# Patient Record
Sex: Male | Born: 1935
Health system: Southern US, Community
[De-identification: ages and names within clinical notes are randomized; demographics above are authoritative.]

## PROBLEM LIST (undated history)

## (undated) DIAGNOSIS — S381XXA Crushing injury of abdomen, lower back, and pelvis, initial encounter: Secondary | ICD-10-CM

## (undated) DIAGNOSIS — Z972 Presence of dental prosthetic device (complete) (partial): Secondary | ICD-10-CM

## (undated) DIAGNOSIS — D6959 Other secondary thrombocytopenia: Secondary | ICD-10-CM

## (undated) DIAGNOSIS — S8991XA Unspecified injury of right lower leg, initial encounter: Secondary | ICD-10-CM

## (undated) DIAGNOSIS — Z973 Presence of spectacles and contact lenses: Secondary | ICD-10-CM

## (undated) DIAGNOSIS — Z8739 Personal history of other diseases of the musculoskeletal system and connective tissue: Secondary | ICD-10-CM

## (undated) DIAGNOSIS — R7309 Other abnormal glucose: Secondary | ICD-10-CM

## (undated) DIAGNOSIS — I10 Essential (primary) hypertension: Secondary | ICD-10-CM

## (undated) DIAGNOSIS — C61 Malignant neoplasm of prostate: Secondary | ICD-10-CM

## (undated) DIAGNOSIS — I872 Venous insufficiency (chronic) (peripheral): Secondary | ICD-10-CM

## (undated) DIAGNOSIS — I82409 Acute embolism and thrombosis of unspecified deep veins of unspecified lower extremity: Secondary | ICD-10-CM

## (undated) DIAGNOSIS — N189 Chronic kidney disease, unspecified: Secondary | ICD-10-CM

## (undated) DIAGNOSIS — E785 Hyperlipidemia, unspecified: Secondary | ICD-10-CM

## (undated) DIAGNOSIS — M549 Dorsalgia, unspecified: Secondary | ICD-10-CM

## (undated) DIAGNOSIS — Z923 Personal history of irradiation: Secondary | ICD-10-CM

## (undated) DIAGNOSIS — N281 Cyst of kidney, acquired: Secondary | ICD-10-CM

## (undated) DIAGNOSIS — T50905A Adverse effect of unspecified drugs, medicaments and biological substances, initial encounter: Secondary | ICD-10-CM

## (undated) DIAGNOSIS — R351 Nocturia: Secondary | ICD-10-CM

## (undated) DIAGNOSIS — H9190 Unspecified hearing loss, unspecified ear: Secondary | ICD-10-CM

## (undated) HISTORY — DX: Adverse effect of unspecified drugs, medicaments and biological substances, initial encounter: T50.905A

## (undated) HISTORY — DX: Essential (primary) hypertension: I10

## (undated) HISTORY — DX: Venous insufficiency (chronic) (peripheral): I87.2

## (undated) HISTORY — DX: Other secondary thrombocytopenia: D69.59

## (undated) HISTORY — PX: COLONOSCOPY: SHX174

## (undated) HISTORY — DX: Other abnormal glucose: R73.09

## (undated) HISTORY — DX: Hyperlipidemia, unspecified: E78.5

## (undated) HISTORY — DX: Acute embolism and thrombosis of unspecified deep veins of unspecified lower extremity: I82.409

---

## 2004-12-04 ENCOUNTER — Ambulatory Visit: Payer: Self-pay | Admitting: Internal Medicine

## 2005-08-13 ENCOUNTER — Ambulatory Visit: Payer: Self-pay | Admitting: Internal Medicine

## 2005-08-20 ENCOUNTER — Ambulatory Visit: Payer: Self-pay | Admitting: Internal Medicine

## 2005-08-27 ENCOUNTER — Ambulatory Visit: Payer: Self-pay | Admitting: Internal Medicine

## 2006-09-05 ENCOUNTER — Ambulatory Visit: Payer: Self-pay | Admitting: Internal Medicine

## 2007-03-21 ENCOUNTER — Ambulatory Visit: Payer: Self-pay | Admitting: Internal Medicine

## 2007-03-21 LAB — CONVERTED CEMR LAB
Cholesterol: 137 mg/dL (ref 0–200)
HDL: 29 mg/dL — ABNORMAL LOW (ref >39.0)
Hgb A1c MFr Bld: 6.6 % — ABNORMAL HIGH (ref 4.6–6.0)
LDL Cholesterol: 69 mg/dL (ref 0–99)

## 2007-09-12 ENCOUNTER — Ambulatory Visit: Payer: Self-pay | Admitting: Internal Medicine

## 2007-09-12 ENCOUNTER — Encounter: Payer: Self-pay | Admitting: Internal Medicine

## 2007-09-12 DIAGNOSIS — E785 Hyperlipidemia, unspecified: Secondary | ICD-10-CM

## 2007-09-12 DIAGNOSIS — I10 Essential (primary) hypertension: Secondary | ICD-10-CM

## 2007-09-12 DIAGNOSIS — R319 Hematuria, unspecified: Secondary | ICD-10-CM | POA: Insufficient documentation

## 2007-09-12 LAB — CONVERTED CEMR LAB
AST: 32 units/L (ref 0–37)
Albumin: 4.1 g/dL (ref 3.5–5.2)
Bacteria, UA: NEGATIVE
Basophils Absolute: 0 10*3/uL (ref 0.0–0.1)
Basophils Relative: 0 % (ref 0.0–1.0)
Creatinine, Ser: 1.8 mg/dL — ABNORMAL HIGH (ref 0.4–1.5)
Crystals: NEGATIVE
Eosinophils Relative: 1.4 % (ref 0.0–5.0)
GFR calc Af Amer: 48 mL/min
MCV: 93.9 fL (ref 78.0–100.0)
Monocytes Absolute: 0.6 10*3/uL (ref 0.2–0.7)
Monocytes Relative: 10.3 % (ref 3.0–11.0)
Nitrite: NEGATIVE
Platelets: 193 10*3/uL (ref 150–400)
RDW: 12.3 % (ref 11.5–14.6)
Sodium: 143 meq/L (ref 135–145)
Squamous Epithelial / LPF: NEGATIVE /lpf
TSH: 1.59 microintl units/mL (ref 0.35–5.50)
Total Protein: 6.4 g/dL (ref 6.0–8.3)
Urobilinogen, UA: 1 (ref 0.0–1.0)

## 2007-09-14 ENCOUNTER — Ambulatory Visit: Payer: Self-pay

## 2007-10-20 ENCOUNTER — Encounter: Payer: Self-pay | Admitting: Internal Medicine

## 2007-10-20 ENCOUNTER — Ambulatory Visit: Payer: Self-pay | Admitting: Internal Medicine

## 2007-10-20 DIAGNOSIS — E1122 Type 2 diabetes mellitus with diabetic chronic kidney disease: Secondary | ICD-10-CM

## 2007-10-20 DIAGNOSIS — N183 Chronic kidney disease, stage 3 (moderate): Secondary | ICD-10-CM

## 2007-10-20 DIAGNOSIS — I872 Venous insufficiency (chronic) (peripheral): Secondary | ICD-10-CM

## 2007-11-06 ENCOUNTER — Telehealth: Payer: Self-pay | Admitting: Internal Medicine

## 2008-01-05 ENCOUNTER — Encounter: Payer: Self-pay | Admitting: Internal Medicine

## 2008-01-12 ENCOUNTER — Ambulatory Visit: Payer: Self-pay | Admitting: Internal Medicine

## 2008-01-13 LAB — CONVERTED CEMR LAB
BUN: 21 mg/dL (ref 6–23)
Creatinine, Ser: 1.5 mg/dL (ref 0.4–1.5)
GFR calc non Af Amer: 49 mL/min
Glucose, Bld: 141 mg/dL — ABNORMAL HIGH (ref 70–99)
Potassium: 4.3 meq/L (ref 3.5–5.1)
Sodium: 139 meq/L (ref 135–145)

## 2008-01-18 ENCOUNTER — Ambulatory Visit: Payer: Self-pay | Admitting: Internal Medicine

## 2008-01-18 DIAGNOSIS — N281 Cyst of kidney, acquired: Secondary | ICD-10-CM

## 2008-01-18 DIAGNOSIS — E119 Type 2 diabetes mellitus without complications: Secondary | ICD-10-CM | POA: Insufficient documentation

## 2008-01-19 ENCOUNTER — Encounter: Admission: RE | Admit: 2008-01-19 | Discharge: 2008-01-19 | Payer: Self-pay | Admitting: Nephrology

## 2008-03-21 ENCOUNTER — Encounter: Payer: Self-pay | Admitting: Internal Medicine

## 2008-07-16 ENCOUNTER — Ambulatory Visit: Payer: Self-pay | Admitting: Internal Medicine

## 2008-07-16 DIAGNOSIS — M545 Low back pain, unspecified: Secondary | ICD-10-CM | POA: Insufficient documentation

## 2008-09-05 ENCOUNTER — Encounter: Payer: Self-pay | Admitting: Internal Medicine

## 2008-09-12 ENCOUNTER — Ambulatory Visit: Payer: Self-pay | Admitting: Internal Medicine

## 2008-10-10 ENCOUNTER — Ambulatory Visit: Payer: Self-pay | Admitting: Internal Medicine

## 2008-10-21 LAB — CBC WITH DIFFERENTIAL/PLATELET
Basophils Absolute: 0 10*3/uL (ref 0.0–0.1)
Eosinophils Absolute: 0.1 10*3/uL (ref 0.0–0.5)
HGB: 16.2 g/dL (ref 13.0–17.1)
MCV: 94.3 fL (ref 81.6–98.0)
MONO#: 0.5 10*3/uL (ref 0.1–0.9)
NEUT#: 4 10*3/uL (ref 1.5–6.5)
RBC: 5.02 10*6/uL (ref 4.20–5.71)
RDW: 12.5 % (ref 11.2–14.6)
WBC: 6.3 10*3/uL (ref 4.0–10.0)

## 2008-10-21 LAB — COMPREHENSIVE METABOLIC PANEL
Albumin: 4.4 g/dL (ref 3.5–5.2)
BUN: 24 mg/dL — ABNORMAL HIGH (ref 6–23)
CO2: 27 mEq/L (ref 19–32)
Calcium: 9 mg/dL (ref 8.4–10.5)
Chloride: 102 mEq/L (ref 96–112)
Glucose, Bld: 106 mg/dL — ABNORMAL HIGH (ref 70–99)
Potassium: 4.4 mEq/L (ref 3.5–5.3)
Sodium: 139 mEq/L (ref 135–145)
Total Protein: 6.6 g/dL (ref 6.0–8.3)

## 2008-10-21 LAB — LACTATE DEHYDROGENASE: LDH: 150 U/L (ref 94–250)

## 2009-02-28 ENCOUNTER — Ambulatory Visit: Payer: Self-pay | Admitting: Internal Medicine

## 2009-02-28 LAB — CONVERTED CEMR LAB
BUN: 23 mg/dL (ref 6–23)
CO2: 32 meq/L (ref 19–32)
Calcium: 8.9 mg/dL (ref 8.4–10.5)
Chloride: 105 meq/L (ref 96–112)
Creatinine, Ser: 1.6 mg/dL — ABNORMAL HIGH (ref 0.4–1.5)
GFR calc non Af Amer: 45 mL/min
Potassium: 4.3 meq/L (ref 3.5–5.1)
Sodium: 145 meq/L (ref 135–145)

## 2009-03-11 ENCOUNTER — Encounter: Payer: Self-pay | Admitting: Internal Medicine

## 2009-03-12 ENCOUNTER — Ambulatory Visit: Payer: Self-pay | Admitting: Internal Medicine

## 2009-04-19 ENCOUNTER — Ambulatory Visit (HOSPITAL_COMMUNITY): Admission: RE | Admit: 2009-04-19 | Discharge: 2009-04-19 | Payer: Self-pay | Admitting: Urology

## 2009-07-28 ENCOUNTER — Encounter (INDEPENDENT_AMBULATORY_CARE_PROVIDER_SITE_OTHER): Payer: Self-pay | Admitting: *Deleted

## 2009-09-12 ENCOUNTER — Encounter: Payer: Self-pay | Admitting: Internal Medicine

## 2009-09-17 ENCOUNTER — Ambulatory Visit: Payer: Self-pay | Admitting: Internal Medicine

## 2009-10-02 ENCOUNTER — Encounter: Payer: Self-pay | Admitting: Internal Medicine

## 2009-12-31 ENCOUNTER — Encounter (INDEPENDENT_AMBULATORY_CARE_PROVIDER_SITE_OTHER): Payer: Self-pay | Admitting: *Deleted

## 2010-01-08 ENCOUNTER — Encounter (INDEPENDENT_AMBULATORY_CARE_PROVIDER_SITE_OTHER): Payer: Self-pay | Admitting: *Deleted

## 2010-01-12 ENCOUNTER — Ambulatory Visit: Payer: Self-pay | Admitting: Internal Medicine

## 2010-01-26 ENCOUNTER — Ambulatory Visit: Payer: Self-pay | Admitting: Internal Medicine

## 2010-01-26 LAB — HM COLONOSCOPY

## 2010-01-27 ENCOUNTER — Encounter: Payer: Self-pay | Admitting: Internal Medicine

## 2010-01-29 ENCOUNTER — Encounter: Payer: Self-pay | Admitting: Internal Medicine

## 2010-03-10 ENCOUNTER — Ambulatory Visit: Payer: Self-pay | Admitting: Internal Medicine

## 2010-03-10 LAB — CONVERTED CEMR LAB
Calcium: 8.9 mg/dL (ref 8.4–10.5)
Creatinine, Ser: 1.7 mg/dL — ABNORMAL HIGH (ref 0.4–1.5)

## 2010-03-17 ENCOUNTER — Ambulatory Visit: Payer: Self-pay | Admitting: Internal Medicine

## 2010-03-20 ENCOUNTER — Encounter: Payer: Self-pay | Admitting: Internal Medicine

## 2010-06-25 ENCOUNTER — Encounter: Payer: Self-pay | Admitting: Internal Medicine

## 2010-09-21 ENCOUNTER — Ambulatory Visit: Payer: Self-pay | Admitting: Internal Medicine

## 2010-09-23 LAB — CONVERTED CEMR LAB
ALT: 34 units/L (ref 0–53)
AST: 30 units/L (ref 0–37)
Alkaline Phosphatase: 45 units/L (ref 39–117)
BUN: 28 mg/dL — ABNORMAL HIGH (ref 6–23)
Bilirubin Urine: NEGATIVE
Bilirubin, Direct: 0.1 mg/dL (ref 0.0–0.3)
Cholesterol: 141 mg/dL (ref 0–200)
Eosinophils Relative: 1.5 % (ref 0.0–5.0)
GFR calc non Af Amer: 52.89 mL/min (ref >60)
HCT: 47 % (ref 39.0–52.0)
Hemoglobin: 16 g/dL (ref 13.0–17.0)
LDL Cholesterol: 85 mg/dL (ref 0–99)
Leukocytes, UA: NEGATIVE
Lymphs Abs: 1.9 10*3/uL (ref 0.7–4.0)
Monocytes Relative: 9.6 % (ref 3.0–12.0)
Nitrite: NEGATIVE
PSA: 6.42 ng/mL — ABNORMAL HIGH (ref 0.10–4.00)
Platelets: 175 10*3/uL (ref 150.0–400.0)
Potassium: 4.4 meq/L (ref 3.5–5.1)
Sodium: 138 meq/L (ref 135–145)
TSH: 1.8 microintl units/mL (ref 0.35–5.50)
Total Bilirubin: 0.5 mg/dL (ref 0.3–1.2)
Total CHOL/HDL Ratio: 5
Urobilinogen, UA: 0.2 (ref 0.0–1.0)
WBC: 5.6 10*3/uL (ref 4.5–10.5)

## 2010-10-27 ENCOUNTER — Encounter: Payer: Self-pay | Admitting: Internal Medicine

## 2011-01-10 ENCOUNTER — Encounter: Payer: Self-pay | Admitting: Internal Medicine

## 2011-01-17 LAB — CONVERTED CEMR LAB
ALT: 38 units/L (ref 0–53)
Alkaline Phosphatase: 47 units/L (ref 39–117)
Basophils Absolute: 0 10*3/uL (ref 0.0–0.1)
Basophils Relative: 0.3 % (ref 0.0–3.0)
Chloride: 101 meq/L (ref 96–112)
Eosinophils Absolute: 0.1 10*3/uL (ref 0.0–0.7)
HCT: 47.5 % (ref 39.0–52.0)
HDL: 29.2 mg/dL — ABNORMAL LOW (ref >39.00)
LDL Cholesterol: 94 mg/dL (ref 0–99)
Lymphocytes Relative: 33.6 % (ref 12.0–46.0)
Lymphs Abs: 1.8 10*3/uL (ref 0.7–4.0)
MCV: 95.3 fL (ref 78.0–100.0)
Neutrophils Relative %: 55 % (ref 43.0–77.0)
Nitrite: NEGATIVE
Platelets: 163 10*3/uL (ref 150.0–400.0)
RBC: 4.98 M/uL (ref 4.22–5.81)
RDW: 12.1 % (ref 11.5–14.6)
Sodium: 139 meq/L (ref 135–145)
Specific Gravity, Urine: 1.01 (ref 1.000–1.030)
Total Bilirubin: 0.9 mg/dL (ref 0.3–1.2)
Total CHOL/HDL Ratio: 5
Total Protein, Urine: NEGATIVE mg/dL
Total Protein: 6.8 g/dL (ref 6.0–8.3)
VLDL: 18.6 mg/dL (ref 0.0–40.0)
WBC: 5.4 10*3/uL (ref 4.5–10.5)

## 2011-01-21 NOTE — Procedures (Signed)
Summary: Colonoscopy  Patient: Edward [name] Note: All result statuses are Final unless otherwise noted.  Tests: (1) Colonoscopy (COL)   COL Colonoscopy           DONE     [facility]     [address]     [place], [place]  [address]           COLONOSCOPY PROCEDURE REPORT           PATIENT:  [name], Edward  MR#:  [id number]     BIRTHDATE:  08-Jun-1933, 76 yrs. old  GENDER:  male           ENDOSCOPIST:  Iva Boop, MD, [facility]           PROCEDURE DATE:  01/26/2010     PROCEDURE:  Colonoscopy with snare polypectomy     ASA CLASS:  Class II     INDICATIONS:  history of pre-cancerous (adenomatous) colon polyps     3 mm adenoma removed 9/05           MEDICATIONS:   Fentanyl 50 mcg IV, Versed 5 mg IV           DESCRIPTION OF PROCEDURE:   After the risks benefits and     alternatives of the procedure were thoroughly explained, informed     consent was obtained.  Digital rectal exam was performed and     revealed an enlarged prostate.  This is known. No nodules. The LB     [id number]-[id number] endoscope was introduced through the anus and     advanced to the cecum, which was identified by both the appendix     and ileocecal valve, without limitations.  The quality of the prep     was adequate, using MoviPrep.  The instrument was then slowly     withdrawn as the colon was fully examined.     Insertion: 10:25 minutes Withdrawal: 13:22 minutes     <<PROCEDUREIMAGES>>           FINDINGS:  A diminutive polyp was found in the sigmoid colon. It     was 3 mm in size. Polyp was snared without cautery. Retrieval was     successful. snare polyp  A diverticulum was found in the ascending     colon.  This was otherwise a normal examination of the colon. The     colon was redundant and position change to supine and mid-right     quadrant pressure was required to enter cecum.   Retroflexed views     in the rectum revealed no abnormalities.    The scope was then     withdrawn from the  patient and the procedure completed.           COMPLICATIONS:  None           ENDOSCOPIC IMPRESSION:     1) 3 mm diminutive polyp in the sigmoid colon - removed     2) Diverticulum in the ascending colon     3) Otherwise normal examination with redundant colon, adequate     prep     4) Prior 3 mm adenoma removal 9/05           REPEAT EXAM:  In for Colonoscopy, pending biopsy results. if this     polyp is hyperplastic, routine repeat colonoscopy will not be     recommended again since only 3 mm adenoma in past  Iva Boop, MD, [facility]           CC:  Zackery Barefoot, MD\     Barron Alvine, MD     The Patient           n.     Rosalie Doctor:   Iva Boop at 01/26/2010 11:07 AM           Toy Cookey, [id number]  Note: An exclamation mark (!) indicates a result that was not dispersed into the flowsheet. Document Creation Date: 01/26/2010 11:07 AM _______________________________________________________________________  (1) Order result status: Final Collection or observation date-time: 01/26/2010 10:59 Requested date-time:  Receipt date-time:  Reported date-time:  Referring Physician:   Ordering Physician: Stan Head [phone]) Specimen Source:  Source: Launa Grill Order Number: [id number] Lab site:

## 2011-01-21 NOTE — Letter (Signed)
Summary: Previsit letter  [facility]  [address] [place], [place] [address]   Phone: [phone]  Fax: [phone]       12/31/2009 MRN: [id number]  Edward [name] [address] [place], [place]  [address]  Dear [name],  Welcome to the Gastroenterology Division at [facility].    You are scheduled to see a nurse for your pre-procedure visit on 01/12/2010 at 10:30am on the 3rd floor at [facility], [address]  We ask that you try to arrive at our office 15 minutes prior to your appointment time to allow for check-in.  Your nurse visit will consist of discussing your medical and surgical history, your immediate family medical history, and your medications.    Please bring a complete list of all your medications or, if you prefer, bring the medication bottles and we will list them.  We will need to be aware of both prescribed and over the counter drugs.  We will need to know exact dosage information as well.  If you are on blood thinners (Coumadin, Plavix, Aggrenox, Ticlid, etc.) please call our office today/prior to your appointment, as we need to consult with your physician about holding your medication.   Please be prepared to read and sign documents such as consent forms, a financial agreement, and acknowledgement forms.  If necessary, and with your consent, a friend or relative is welcome to sit-in on the nurse visit with you.  Please bring your insurance card so that we may make a copy of it.  If your insurance requires a referral to see a specialist, please bring your referral form from your primary care physician.  No co-pay is required for this nurse visit.     If you cannot keep your appointment, please call [phone] to cancel or reschedule prior to your appointment date.  This allows [place] the opportunity to schedule an appointment for another patient in need of care.    Thank you for choosing [facility] for your  medical needs.  We appreciate the opportunity to care for you.  Please visit [place] at our website  to learn more about our practice.                     Sincerely.                                                                                                                   The Gastroenterology Division

## 2011-01-21 NOTE — Letter (Signed)
Summary: [facility]   Imported By: Sherian Rein 11/05/2010 10:45:41  _____________________________________________________________________  External Attachment:    Type:   Image     Comment:   External Document

## 2011-01-21 NOTE — Letter (Signed)
Summary: Patient Notice- Polyp Results  [facility]  [address] [place], [place] [address]   Phone: [phone]  Fax: [phone]        January 27, 2010 MRN: [id number]    LINDA BIEHN [address] [place], [place]  [address]    Dear Mr. MATASSA,  The tiny polyp removed from your colon must not have been recovered as the pathology report indicated we sent them plant material. Sometimes the polyps are so small that when we collect what we think is the tissue, it is really a piece of plant or vegetable.  Given that this polyp was tiny (it is removed) and prior polyp was also tiny and benign, I do not think you need further routine repeat colonoscopy to look for polyps and/or colon cancer.  Should you develop new or worsening symptoms of abdominal pain, bowel habit changes or bleeding from the rectum or bowels, please schedule an evaluation with either your primary care physician or with me.  Please call us if you are having persistent problems or have questions about your condition that have not been fully answered at this time.  Sincerely,  Iva Boop MD, [facility]  This letter has been electronically signed by your physician.  Appended Document: Patient Notice- Polyp Results letter mailed 2.11.11

## 2011-01-21 NOTE — Letter (Signed)
Summary: [facility]   Imported By: Lester La Salle 07/09/2010 10:21:03  _____________________________________________________________________  External Attachment:    Type:   Image     Comment:   External Document

## 2011-01-21 NOTE — Letter (Signed)
Summary: [facility]   Imported By: Sherian Rein 04/15/2010 08:21:42  _____________________________________________________________________  External Attachment:    Type:   Image     Comment:   External Document

## 2011-01-21 NOTE — Assessment & Plan Note (Signed)
Summary: CPX PER PT/MEDICARE LABS AFTER PT AWARE/CD   Vital Signs:  Patient profile:   77 year old male Height:      73 inches Weight:      217 pounds BMI:     28.73 O2 Sat:      95 % on Room air Temp:     97.6 degrees F oral Pulse rate:   69 / minute BP sitting:   122 / 80  (left arm) Cuff size:   regular  Vitals Entered By: Bill Salinas CMA (September 21, 2010 8:34 AM)  O2 Flow:  Room air CC: yearly/ ab  Vision Screening:      Vision Comments: Eye Exam Due   CC:  yearly/ ab.  History of Present Illness: The patient presents for a preventive health examination  Patient past medical history, social history, and family history reviewed in detail no significant changes.  Patient is physically active. Depression is negative and mood is good. Hearing is normal, and able to perform activities of daily living. Risk of falling is negligible and home safety has been reviewed and is appropriate. Patient has normal height, weight, and visual acuity. Patient has been counseled on age-appropriate routine health concerns for screening and prevention. Education, counseling done. The patient presents for a follow up of back pain, HTN, CRF   Preventive Screening-Counseling & Management  Alcohol-Tobacco     Alcohol drinks/day: <1     Alcohol type: beer     >5/day in last 3 mos: yes     Smoking Status: never  Caffeine-Diet-Exercise     Caffeine use/day: 1 cup daily     Does Patient Exercise: yes     Type of exercise: walking     Exercise (avg: min/session): <30     Times/week: 3     Depression Counseling: not indicated; screening negative for depression  Hep-HIV-STD-Contraception     Hepatitis Risk: no risk noted     Dental Visit-last 6 months yes     Sun Exposure-Excessive: no  Safety-Violence-Falls     Seat Belt Use: yes     Helmet Use: n/a     Firearms in the Home: firearms in the home     Smoke Detectors: yes     Violence in the Home: no risk noted     Sexual Abuse: no   Fall Risk: Low fall risk      Sexual History:  currently monogamous.        Drug Use:  never.    Current Medications (verified): 1)  Niaspan 500 Mg  Tbcr (Niacin (Antihyperlipidemic)) .Marland Kitchen.. 1 Qd 2)  Folic Acid 1 Mg Tabs (Folic Acid) .... Qd 3)  Triamterene-Hctz 37.5-25 Mg Tabs (Triamterene-Hctz) .... Qam 4)  Aspirin 81 Mg  Tabs (Aspirin) .... Once Daily 5)  Vitamin D [address] Unit  Caps (Ergocalciferol) .... Q Month 6)  Benicar 40 Mg  Tabs (Olmesartan Medoxomil) .Marland Kitchen.. 1 By Mouth Once Daily 7)  Centrum Silver  Tabs (Multiple Vitamins-Minerals) .... Once Daily  Allergies (verified): 1)  Aleve (Naproxen Sodium)  Past History:  Past Medical History: Last updated: 09/17/2009 Hyperlipidemia Hypertension CRF Dr Ulice Brilliant. glu LE ven. insufficiency Low plts from NSAIDs Prostate cancer, hx of Dr Isabel Caprice 2010  Family History: Last updated: 09/12/2007 M-dementia, F-TB  Social History: Last updated: 09/12/2007 Retired Married Never Smoked  Past Surgical History: Denies surgical history  Social History: Caffeine use/day:  1 cup daily Does Patient Exercise:  yes Dental Care w/in 6 mos.:  yes Sun Exposure-Excessive:  no Seat Belt Use:  yes Fall Risk:  Low fall risk Drug Use:  never Hepatitis Risk:  no risk noted Sexual History:  currently monogamous  Review of Systems  The patient denies anorexia, fever, weight loss, weight gain, vision loss, decreased hearing, hoarseness, chest pain, syncope, dyspnea on exertion, peripheral edema, prolonged cough, headaches, hemoptysis, abdominal pain, melena, hematochezia, severe indigestion/heartburn, hematuria, incontinence, genital sores, muscle weakness, suspicious skin lesions, transient blindness, difficulty walking, depression, unusual weight change, abnormal bleeding, enlarged lymph nodes, angioedema, and testicular masses.    Physical Exam  General:  Well-developed,well-nourished,in no acute distress; alert,appropriate and  cooperative throughout examination Head:  Normocephalic and atraumatic without obvious abnormalities. No apparent alopecia or balding. Eyes:  No corneal or conjunctival inflammation noted. EOMI. Perrla. Funduscopic exam benign, without hemorrhages, exudates or papilledema. Vision grossly normal. Ears:  External ear exam shows no significant lesions or deformities.  Otoscopic examination reveals clear canals, tympanic membranes are intact bilaterally without bulging, retraction, inflammation or discharge. Hearing is grossly normal bilaterally. Nose:  External nasal examination shows no deformity or inflammation. Nasal mucosa are pink and moist without lesions or exudates. Mouth:  Oral mucosa and oropharynx without lesions or exudates.  Teeth in good repair. Neck:  No deformities, masses, or tenderness noted. Lungs:  Normal respiratory effort, chest expands symmetrically. Lungs are clear to auscultation, no crackles or wheezes. Heart:  Normal rate and regular rhythm. S1 and S2 normal without gallop, murmur, click, rub or other extra sounds. Abdomen:  Bowel sounds positive,abdomen soft and non-tender without masses, organomegaly or hernias noted. Rectal:  per Urol Genitalia:  per Urol Prostate:  per Urol Msk:  No deformity or scoliosis noted of thoracic or lumbar spine.  Stiff LS Pulses:  R and L carotid,radial,femoral,dorsalis pedis and posterior tibial pulses are full and equal bilaterally Extremities:  No clubbing, cyanosis, edema, or deformity noted with normal full range of motion of all joints.   Neurologic:  No cranial nerve deficits noted. Station and gait are normal. Plantar reflexes are down-going bilaterally. DTRs are symmetrical throughout. Sensory, motor and coordinative functions appear intact. Balance is off a little Skin:  Intact without suspicious lesions or rashes Psych:  Cognition and judgment appear intact. Alert and cooperative with normal attention span and concentration. No  apparent delusions, illusions, hallucinations   Contraindications/Deferment of Procedures/Staging:    Test/Procedure: FLU VAX    Reason for deferment: patient declined     Test/Procedure: Pneumovax vaccine    Reason for deferment: patient declined   Impression & Recommendations:  Problem # 1:  WELL ADULT EXAM (ICD-V70.0) Assessment New  Overall doing well, age appropriate education and counseling updated and referral for appropriate preventive services done unless declined, immunizations up to date or declined, diet counseling done if overweight, urged to quit smoking if smokes, most recent labs reviewed and current ordered if appropriate, ecg reviewed or declined (interpretation per ECG scanned in the EMR if done); information regarding Medicare Preventation requirements given if appropriate.  Declined all vaccines Orders: TLB-BMP (Basic Metabolic Panel-BMET) (80048-METABOL) TLB-CBC Platelet - w/Differential (85025-CBCD) TLB-Hepatic/Liver Function Pnl (80076-HEPATIC) TLB-Lipid Panel (80061-LIPID) TLB-PSA (Prostate Specific Antigen) (84153-PSA) TLB-TSH (Thyroid Stimulating Hormone) (84443-TSH) TLB-Udip ONLY (81003-UDIP) Medicare -1st Annual Wellness Visit [phone])  Problem # 2:  PROSTATE CANCER, HX OF (ICD-V10.46) Assessment: Improved s/p biopsy 7/11  Problem # 3:  RENAL INSUFFICIENCY (ICD-593.9) Assessment: Unchanged On the regimen of medicine(s) reflected in the chart    Problem # 4:  HYPERTENSION (ICD-401.9) Assessment:  Unchanged  His updated medication list for this problem includes:    Triamterene-hctz 37.5-25 Mg Tabs (Triamterene-hctz) ..... Qam    Benicar 40 Mg Tabs (Olmesartan medoxomil) .Marland Kitchen... 1 by mouth once daily  Problem # 5:  LOW BACK PAIN (ICD-724.2) Assessment: Unchanged  His updated medication list for this problem includes:    Aspirin 81 Mg Tabs (Aspirin) ..... Once daily  Complete Medication List: 1)  Niaspan 500 Mg Tbcr (Niacin (antihyperlipidemic))  .Marland Kitchen.. 1 qd 2)  Folic Acid 1 Mg Tabs (Folic acid) .... Qd 3)  Triamterene-hctz 37.5-25 Mg Tabs (Triamterene-hctz) .... Qam 4)  Aspirin 81 Mg Tabs (Aspirin) .... Once daily 5)  Benicar 40 Mg Tabs (Olmesartan medoxomil) .Marland Kitchen.. 1 by mouth once daily 6)  Centrum Silver Tabs (Multiple vitamins-minerals) .... Once daily 7)  Vitamin D (ergocalciferol) 50000 Unit Caps (Ergocalciferol) .Marland Kitchen.. 1 by mouth q 1 month  Patient Instructions: 1)  Please schedule a follow-up appointment in 6 months. Prescriptions: BENICAR 40 MG  TABS (OLMESARTAN MEDOXOMIL) 1 by mouth once daily  #90 x 3   Entered and Authorized by:   Tresa Garter MD   Signed by:   Tresa Garter MD on 09/21/2010   Method used:   Print then Give to Patient   RxID:   [id number] VITAMIN D (ERGOCALCIFEROL) 50000 UNIT CAPS (ERGOCALCIFEROL) 1 by mouth q 1 month  #3 x 4   Entered and Authorized by:   Tresa Garter MD   Signed by:   Tresa Garter MD on 09/21/2010   Method used:   Print then Give to Patient   RxID:   [id number] TRIAMTERENE-HCTZ 37.5-25 MG TABS (TRIAMTERENE-HCTZ) qam  #90 x 04   Entered and Authorized by:   Tresa Garter MD   Signed by:   Tresa Garter MD on 09/21/2010   Method used:   Print then Give to Patient   RxID:   [id number] FOLIC ACID 1 MG TABS (FOLIC ACID) qd  #90 x 4   Entered and Authorized by:   Tresa Garter MD   Signed by:   Tresa Garter MD on 09/21/2010   Method used:   Print then Give to Patient   RxID:   [id number] NIASPAN 500 MG  TBCR (NIACIN (ANTIHYPERLIPIDEMIC)) 1 qd  #90 x 4   Entered and Authorized by:   Tresa Garter MD   Signed by:   Tresa Garter MD on 09/21/2010   Method used:   Print then Give to Patient   RxID:   [id number]

## 2011-01-21 NOTE — Miscellaneous (Signed)
Summary: [facility]  Clinical Lists Changes  Medications: Added new medication of MOVIPREP 100 GM  SOLR (PEG-KCL-NACL-NASULF-NA ASC-C) As per prep instructions. - Signed Rx of MOVIPREP 100 GM  SOLR (PEG-KCL-NACL-NASULF-NA ASC-C) As per prep instructions.;  #1 x 0;  Signed;  Entered by: Ezra Sites RN;  Authorized by: Iva Boop MD, [facility];  Method used: Electronically to [facility] [address], [place], [place], [place]  [address], Ph: [phone], Fax: [phone] Observations: Added new observation of ALLERGY REV: Done (01/12/2010 10:14)    Prescriptions: MOVIPREP 100 GM  SOLR (PEG-KCL-NACL-NASULF-NA ASC-C) As per prep instructions.  #1 x 0   Entered by:   Ezra Sites RN   Authorized by:   Iva Boop MD, [facility]   Signed by:   Ezra Sites RN on 01/12/2010   Method used:   Electronically to        [facility]  [address] (retail)       [address], [address]       [place], [place]  [address]       Ph: [phone]       Fax: [phone]   RxID:   [id number]

## 2011-01-21 NOTE — Letter (Signed)
Summary: [facility]   Imported By: Sherian Rein 02/07/2010 09:51:03  _____________________________________________________________________  External Attachment:    Type:   Image     Comment:   External Document

## 2011-01-21 NOTE — Letter (Signed)
Summary: [facility] Instructions  [facility]  [address] [place], [place] [address]   Phone: [phone]  Fax: [phone]       Edward [name]    1933-04-21    MRN: [id number]        Procedure Day /Date: Monday 01/26/10     Arrival Time: 8:30 AM      Procedure Time: 9:30 AM     Location of Procedure:                    _X _  [facility] (4th Floor)  PREPARATION FOR COLONOSCOPY WITH MOVIPREP   Starting 5 days prior to your procedure Wednesday 01/21/10  do not eat nuts, seeds, popcorn, corn, beans, peas,  salads, or any raw vegetables.  Do not take any fiber supplements (e.g. Metamucil, Citrucel, and Benefiber).  THE DAY BEFORE YOUR PROCEDURE         DATE: 01/25/10    DAY:  Sunday    1.  Drink clear liquids the entire day-NO SOLID FOOD  2.  Do not drink anything colored red or purple.  Avoid juices with pulp.  No orange juice.  3.  Drink at least 64 oz. (8 glasses) of fluid/clear liquids during the day to prevent dehydration and help the prep work efficiently.  CLEAR LIQUIDS INCLUDE: Water Jello Ice Popsicles Tea (sugar ok, no milk/cream) Powdered fruit flavored drinks Coffee (sugar ok, no milk/cream) Gatorade Juice: apple, white grape, white cranberry  Lemonade Clear bullion, consomm, broth Carbonated beverages (any kind) Strained chicken noodle soup Hard Candy                             4.  In the morning, mix first dose of MoviPrep solution:    Empty 1 Pouch A and 1 Pouch B into the disposable container    Add lukewarm drinking water to the top line of the container. Mix to dissolve    Refrigerate (mixed solution should be used within 24 hrs)  5.  Begin drinking the prep at 5:00 p.m. The MoviPrep container is divided by 4 marks.   Every 15 minutes drink the solution down to the next mark (approximately 8 oz) until the full liter is complete.   6.  Follow completed prep with 16 oz of clear liquid of your choice (Nothing red or  purple).  Continue to drink clear liquids until bedtime.  7.  Before going to bed, mix second dose of MoviPrep solution:    Empty 1 Pouch A and 1 Pouch B into the disposable container    Add lukewarm drinking water to the top line of the container. Mix to dissolve    Refrigerate  THE DAY OF YOUR PROCEDURE      DATE: 01/26/10   DAY: Monday   Beginning at 4:30  a.m. (5 hours before procedure):         1. Every 15 minutes, drink the solution down to the next mark (approx 8 oz) until the full liter is complete.  2. Follow completed prep with 16 oz. of clear liquid of your choice.    3. You may drink clear liquids until 7:30 AM   (2 HOURS BEFORE PROCEDURE).   MEDICATION INSTRUCTIONS  Unless otherwise instructed, you should take regular prescription medications with a small sip of water   as early as possible the morning of your procedure.  HOLD TRIATERENE/HCTZ MORNING OF PROCEDURE  OTHER INSTRUCTIONS  You will need a responsible adult at least 75 years of age to accompany you and drive you home.   This person must remain in the waiting room during your procedure.  Wear loose fitting clothing that is easily removed.  Leave jewelry and other valuables at home.  However, you may wish to bring a book to read or  an iPod/MP3 player to listen to music as you wait for your procedure to start.  Remove all body piercing jewelry and leave at home.  Total time from sign-in until discharge is approximately 2-3 hours.  You should go home directly after your procedure and rest.  You can resume normal activities the  day after your procedure.  The day of your procedure you should not:   Drive   Make legal decisions   Operate machinery   Drink alcohol   Return to work  You will receive specific instructions about eating, activities and medications before you leave.    The above instructions have been reviewed and explained to me by   Ezra Sites RN  January 12, 2010  10:48 AM     I fully understand and can verbalize these instructions _____________________________ Date _________

## 2011-01-21 NOTE — Assessment & Plan Note (Signed)
Summary: 6 MO ROV /NWS #   Vital Signs:  Patient profile:   77 year old male Height:      73 inches Weight:      216 pounds BMI:     28.60 Temp:     97 degrees F oral Pulse rate:   68 / minute BP sitting:   100 / 64  (left arm)  Vitals Entered By: Tora Perches (March 17, 2010 8:12 AM) CC: f/u Is Patient Diabetic? No   CC:  f/u.  History of Present Illness: The patient presents for a follow up of hypertension, prostate Ca, CRI, hyperlipidemia.   Preventive Screening-Counseling & Management  Alcohol-Tobacco     Smoking Status: never  Allergies: 1)  Aleve (Naproxen Sodium)  Past History:  Past Medical History: Last updated: 09/17/2009 Hyperlipidemia Hypertension CRF Dr Ulice Brilliant. glu LE ven. insufficiency Low plts from NSAIDs Prostate cancer, hx of Dr Isabel Caprice 2010  Social History: Last updated: 09/12/2007 Retired Married Never Smoked  Review of Systems  The patient denies fever, chest pain, prolonged cough, difficulty walking, and depression.    Physical Exam  General:  Well-developed,well-nourished,in no acute distress; alert,appropriate and cooperative throughout examination Nose:  External nasal examination shows no deformity or inflammation. Nasal mucosa are pink and moist without lesions or exudates. Mouth:  Oral mucosa and oropharynx without lesions or exudates.  Teeth in good repair. Neck:  No deformities, masses, or tenderness noted. Lungs:  Normal respiratory effort, chest expands symmetrically. Lungs are clear to auscultation, no crackles or wheezes. Heart:  Normal rate and regular rhythm. S1 and S2 normal without gallop, murmur, click, rub or other extra sounds. Abdomen:  Bowel sounds positive,abdomen soft and non-tender without masses, organomegaly or hernias noted. Prostate:  NE Msk:  No deformity or scoliosis noted of thoracic or lumbar spine.  Stiff LS Extremities:  No clubbing, cyanosis, edema, or deformity noted with normal full range of  motion of all joints.   Neurologic:  No cranial nerve deficits noted. Station and gait are normal. Plantar reflexes are down-going bilaterally. DTRs are symmetrical throughout. Sensory, motor and coordinative functions appear intact. Balance is off a little Skin:  Intact without suspicious lesions or rashes Psych:  Cognition and judgment appear intact. Alert and cooperative with normal attention span and concentration. No apparent delusions, illusions, hallucinations   Impression & Recommendations:  Problem # 1:  PROSTATE CANCER, HX OF (ICD-V10.46) Assessment Unchanged F/u w/Dr Isabel Caprice  Problem # 2:  RENAL INSUFFICIENCY (ICD-593.9) Assessment: Unchanged The labs were reviewed with the patient. F/ w/Dr Hyman Hopes  Problem # 3:  HYPERTENSION (ICD-401.9) Assessment: Improved  His updated medication list for this problem includes:    Triamterene-hctz 37.5-25 Mg Tabs (Triamterene-hctz) ..... Qam    Benicar 40 Mg Tabs (Olmesartan medoxomil) .Marland Kitchen... 1 by mouth once daily  BP today: 100/64 Prior BP: 126/84 (09/17/2009)  Labs Reviewed: K+: 4.3 (03/10/2010) Creat: : 1.7 (03/10/2010)   Chol: 142 (09/17/2009)   HDL: 29.20 (09/17/2009)   LDL: 94 (09/17/2009)   TG: 93.0 (09/17/2009)  Problem # 4:  LOW BACK PAIN (ICD-724.2) Assessment: Improved  His updated medication list for this problem includes:    Aspirin 81 Mg Tabs (Aspirin) ..... Once daily  Complete Medication List: 1)  Niaspan 500 Mg Tbcr (Niacin (antihyperlipidemic)) .Marland Kitchen.. 1 qd 2)  Folic Acid 1 Mg Tabs (Folic acid) .... Qd 3)  Triamterene-hctz 37.5-25 Mg Tabs (Triamterene-hctz) .... Qam 4)  Aspirin 81 Mg Tabs (Aspirin) .... Once daily 5)  Vitamin D  50000 Unit Caps (Ergocalciferol) .... Q month 6)  Benicar 40 Mg Tabs (Olmesartan medoxomil) .Marland Kitchen.. 1 by mouth once daily 7)  Centrum Silver Tabs (Multiple vitamins-minerals) .... Once daily  Patient Instructions: 1)  Use stretching exercises that I have provided (15 min. or longer every day)    2)  Join the Y 3)  Please schedule a follow-up appointment in 6 months well w/labs.

## 2011-03-31 ENCOUNTER — Encounter: Payer: Self-pay | Admitting: Internal Medicine

## 2011-04-05 ENCOUNTER — Encounter: Payer: Self-pay | Admitting: Internal Medicine

## 2011-04-05 ENCOUNTER — Other Ambulatory Visit (INDEPENDENT_AMBULATORY_CARE_PROVIDER_SITE_OTHER): Payer: Medicare Other

## 2011-04-05 ENCOUNTER — Telehealth: Payer: Self-pay | Admitting: Internal Medicine

## 2011-04-05 ENCOUNTER — Ambulatory Visit (INDEPENDENT_AMBULATORY_CARE_PROVIDER_SITE_OTHER): Payer: Medicare Other | Admitting: Internal Medicine

## 2011-04-05 DIAGNOSIS — Z8546 Personal history of malignant neoplasm of prostate: Secondary | ICD-10-CM

## 2011-04-05 DIAGNOSIS — I872 Venous insufficiency (chronic) (peripheral): Secondary | ICD-10-CM

## 2011-04-05 DIAGNOSIS — R7309 Other abnormal glucose: Secondary | ICD-10-CM

## 2011-04-05 DIAGNOSIS — E785 Hyperlipidemia, unspecified: Secondary | ICD-10-CM

## 2011-04-05 LAB — BASIC METABOLIC PANEL
Calcium: 8.8 mg/dL (ref 8.4–10.5)
GFR: 57.65 mL/min — ABNORMAL LOW (ref >60.00)
Glucose, Bld: 122 mg/dL — ABNORMAL HIGH (ref 70–99)
Potassium: 4.7 mEq/L (ref 3.5–5.1)
Sodium: 139 mEq/L (ref 135–145)

## 2011-04-05 LAB — CBC WITH DIFFERENTIAL/PLATELET
Basophils Relative: 0.3 % (ref 0.0–3.0)
HCT: 46.6 % (ref 39.0–52.0)
Hemoglobin: 16.1 g/dL (ref 13.0–17.0)
Lymphocytes Relative: 29.4 % (ref 12.0–46.0)
Lymphs Abs: 1.8 10*3/uL (ref 0.7–4.0)
MCHC: 34.6 g/dL (ref 30.0–36.0)
Monocytes Relative: 9.2 % (ref 3.0–12.0)
Neutro Abs: 3.7 10*3/uL (ref 1.4–7.7)
RBC: 4.85 Mil/uL (ref 4.22–5.81)

## 2011-04-05 LAB — LIPID PANEL
Total CHOL/HDL Ratio: 4
Triglycerides: 80 mg/dL (ref 0.0–149.0)

## 2011-04-05 LAB — TSH: TSH: 1.51 u[IU]/mL (ref 0.35–5.50)

## 2011-04-05 LAB — PSA: PSA: 7.63 ng/mL — ABNORMAL HIGH (ref 0.10–4.00)

## 2011-04-05 NOTE — Assessment & Plan Note (Signed)
Check labs 

## 2011-04-05 NOTE — Assessment & Plan Note (Signed)
Check PSA. ?

## 2011-04-05 NOTE — Assessment & Plan Note (Signed)
Check lipids 

## 2011-04-05 NOTE — Telephone Encounter (Signed)
Edward [name] , please, inform the patient:  the labs are [name] , please, mail the labs to the patient.    Please, keep  next office visit appointment.   Thank you !

## 2011-04-05 NOTE — Progress Notes (Signed)
  Subjective:    Patient ID: Edward [name], male    DOB: February 09, 1933, 77 y.o.   MRN: [id number]  HPI   The patient presents for a follow-up of  chronic hypertension, chronic dyslipidemia, CRI controlled with medicines; f/u prostate ca.   Review of Systems  Constitutional: Negative for chills.  HENT: Negative for congestion.   Eyes: Negative for pain.  Respiratory: Negative for cough.   Cardiovascular: Negative for leg swelling.  Gastrointestinal: Positive for blood in stool.  Genitourinary: Negative for flank pain and penile swelling.  Musculoskeletal: Negative for back pain and joint swelling.  Skin: Negative for rash.  Neurological: Negative for numbness.  Psychiatric/Behavioral: Negative for confusion and sleep disturbance.       Objective:   Physical Exam  Constitutional: He is oriented to person, place, and time. He appears well-developed.  HENT:  Mouth/Throat: Oropharynx is clear and moist.  Eyes: Conjunctivae are normal. Pupils are equal, round, and reactive to light.  Neck: Normal range of motion. No JVD present. No thyromegaly present.  Cardiovascular: Normal rate, regular rhythm, normal heart sounds and intact distal pulses.  Exam reveals no gallop and no friction rub.   No murmur heard. Pulmonary/Chest: Effort normal and breath sounds normal. No respiratory distress. He has no wheezes. He has no rales. He exhibits no tenderness.  Abdominal: Soft. Bowel sounds are normal. He exhibits no distension and no mass. There is no tenderness. There is no rebound and no guarding.  Musculoskeletal: Normal range of motion. He exhibits no edema and no tenderness.  Lymphadenopathy:    He has no cervical adenopathy.  Neurological: He is alert and oriented to person, place, and time. He has normal reflexes. No cranial nerve deficit. He exhibits normal muscle tone. Coordination normal.  Skin: Skin is warm and dry. No rash noted.  Psychiatric: He has a normal mood and affect. His  behavior is normal. Judgment and thought content normal.          Assessment & Plan:  ABNORMAL GLUCOSE NEC Check labs  VENOUS INSUFFICIENCY F/u with Dr Hyman Hopes  HYPERLIPIDEMIA Check lipids  PROSTATE CANCER, HX OF Check PSA

## 2011-04-05 NOTE — Assessment & Plan Note (Signed)
F/u with Dr Hyman Hopes

## 2011-04-06 NOTE — Telephone Encounter (Signed)
Pt's spouse informed. Copies mailed

## 2011-05-07 NOTE — Assessment & Plan Note (Signed)
[facility]                             PRIMARY CARE OFFICE NOTE   Edward, [name]                     MRN:          [id number]  DATE:09/05/2006                            DOB:          1933-04-09    The patient is a 77 year old male present for wellness examination.   PAST MEDICAL HISTORY:  As per August 20, 2005 notes.   FAMILY HISTORY:  As per August 20, 2005 notes.   SOCIAL HISTORY:  As per August 20, 2005 notes.  He is retired,  occasionally doing Community education officer work.   CURRENT MEDICINES:  Reviewed.   REVIEW OF SYSTEMS:  No chest pain or shortness of breath.  No syncope.  No  neurologic complaints.  He enjoys traveling __________ on occasion.  The  rest is negative.   PHYSICAL EXAMINATION:  VITAL SIGNS:  Blood pressure 133/81, pulse 63,  temperature 97.5, weight 220 pounds.  GENERAL:  He looks well, younger than his stated age.  HEENT:  Moist mucosa.  NECK:  Supple.  No thyromegaly or bruit.  LUNGS:  Clear.  No wheezes rales.  HEART:  S1 S2.  No murmur.  No gallop.  ABDOMEN:  Soft, nontender.  No organomegaly.  No masses.  LOWER EXTREMITIES:  Without edema.  NEUROLOGIC:  He is alert and cooperative, denies being depressed.  SKIN:  Clear.  RECTAL:  Reveals normal prostate.  Stool guaiac negative.  No masses.   LABORATORY:  EKG with normal sinus rhythm.   ASSESSMENT/PLAN:  1. Normal wellness examination.  Age/health related issues discussed,      healthy lifestyle discussed.  Obtain lab work appropriate for age.  EKG      today is normal.  2. Elevated glucose.  We will continue to monitor, check A1c.  3. Elevated PSA.  He has been following up with Dr. Isabel Caprice yearly, next      appointment March 2008.  4. Hypertension.  Continue current therapy, controlled.  Follow up with me      in 6 months.  5. Elevated cholesterol.  Repeat lipids.  He is on Niaspan daily and fish      oil. Continue with baby aspirin daily.                           Georgina Quint. Plotnikov, MD   AVP/MedQ  DD:  09/06/2006  DT:  09/06/2006  Job #:  [id number]   cc:   Valetta Fuller, M.D.

## 2011-09-27 ENCOUNTER — Encounter: Payer: Self-pay | Admitting: Internal Medicine

## 2011-09-27 ENCOUNTER — Other Ambulatory Visit: Payer: Self-pay | Admitting: Internal Medicine

## 2011-09-27 ENCOUNTER — Ambulatory Visit (INDEPENDENT_AMBULATORY_CARE_PROVIDER_SITE_OTHER): Payer: Medicare Other | Admitting: Internal Medicine

## 2011-09-27 ENCOUNTER — Other Ambulatory Visit (INDEPENDENT_AMBULATORY_CARE_PROVIDER_SITE_OTHER): Payer: Medicare Other

## 2011-09-27 VITALS — BP 118/70 | HR 72 | Temp 98.6°F | Resp 16 | Wt 213.0 lb

## 2011-09-27 DIAGNOSIS — Z8546 Personal history of malignant neoplasm of prostate: Secondary | ICD-10-CM

## 2011-09-27 DIAGNOSIS — Z0000 Encounter for general adult medical examination without abnormal findings: Secondary | ICD-10-CM | POA: Insufficient documentation

## 2011-09-27 DIAGNOSIS — I10 Essential (primary) hypertension: Secondary | ICD-10-CM

## 2011-09-27 DIAGNOSIS — E785 Hyperlipidemia, unspecified: Secondary | ICD-10-CM

## 2011-09-27 DIAGNOSIS — D126 Benign neoplasm of colon, unspecified: Secondary | ICD-10-CM

## 2011-09-27 DIAGNOSIS — N189 Chronic kidney disease, unspecified: Secondary | ICD-10-CM

## 2011-09-27 DIAGNOSIS — N289 Disorder of kidney and ureter, unspecified: Secondary | ICD-10-CM

## 2011-09-27 DIAGNOSIS — Z136 Encounter for screening for cardiovascular disorders: Secondary | ICD-10-CM

## 2011-09-27 DIAGNOSIS — K635 Polyp of colon: Secondary | ICD-10-CM | POA: Insufficient documentation

## 2011-09-27 LAB — PSA: PSA: 7.69 ng/mL — ABNORMAL HIGH (ref 0.10–4.00)

## 2011-09-27 LAB — CBC WITH DIFFERENTIAL/PLATELET
Basophils Absolute: 0 10*3/uL (ref 0.0–0.1)
HCT: 49.1 % (ref 39.0–52.0)
Hemoglobin: 16.2 g/dL (ref 13.0–17.0)
Lymphs Abs: 1.8 10*3/uL (ref 0.7–4.0)
MCHC: 33 g/dL (ref 30.0–36.0)
MCV: 96.5 fl (ref 78.0–100.0)
Monocytes Absolute: 0.5 10*3/uL (ref 0.1–1.0)
Neutro Abs: 3.9 10*3/uL (ref 1.4–7.7)
Platelets: 170 10*3/uL (ref 150.0–400.0)
RDW: 13 % (ref 11.5–14.6)

## 2011-09-27 LAB — COMPREHENSIVE METABOLIC PANEL
ALT: 30 U/L (ref 0–53)
AST: 28 U/L (ref 0–37)
Albumin: 4.1 g/dL (ref 3.5–5.2)
Alkaline Phosphatase: 40 U/L (ref 39–117)
Glucose, Bld: 139 mg/dL — ABNORMAL HIGH (ref 70–99)
Potassium: 5.1 mEq/L (ref 3.5–5.1)
Sodium: 139 mEq/L (ref 135–145)
Total Bilirubin: 0.7 mg/dL (ref 0.3–1.2)
Total Protein: 6.8 g/dL (ref 6.0–8.3)

## 2011-09-27 LAB — URINALYSIS, ROUTINE W REFLEX MICROSCOPIC
Bilirubin Urine: NEGATIVE
Ketones, ur: NEGATIVE
Specific Gravity, Urine: 1.025 (ref 1.000–1.030)
Urine Glucose: NEGATIVE
pH: 5.5 (ref 5.0–8.0)

## 2011-09-27 LAB — LIPID PANEL
Cholesterol: 136 mg/dL (ref 0–200)
HDL: 33 mg/dL — ABNORMAL LOW (ref >39.00)
LDL Cholesterol: 80 mg/dL (ref 0–99)
Triglycerides: 117 mg/dL (ref 0.0–149.0)

## 2011-09-27 MED ORDER — TRIAMTERENE-HCTZ 37.5-25 MG PO CAPS: 1.0000 | ORAL_CAPSULE | ORAL | Status: DC

## 2011-09-27 MED ORDER — FOLIC ACID 1 MG PO TABS: 1.0000 mg | ORAL_TABLET | Freq: Every day | ORAL | Status: DC

## 2011-09-27 MED ORDER — OLMESARTAN MEDOXOMIL 40 MG PO TABS: 40.0000 mg | ORAL_TABLET | Freq: Every day | ORAL | Status: DC

## 2011-09-27 MED ORDER — NIACIN ER (ANTIHYPERLIPIDEMIC) 500 MG PO TBCR: 500.0000 mg | EXTENDED_RELEASE_TABLET | Freq: Every day | ORAL | Status: DC

## 2011-09-27 MED ORDER — ERGOCALCIFEROL 1.25 MG (50000 UT) PO CAPS: 50000.0000 [IU] | ORAL_CAPSULE | ORAL | Status: DC

## 2011-09-27 NOTE — Assessment & Plan Note (Signed)
Per Dr Gessner 

## 2011-09-27 NOTE — Assessment & Plan Note (Signed)
The patient is here for annual Medicare wellness examination and management of other chronic and acute problems.   The risk factors are reflected in the social history.  The roster of all physicians providing medical care to patient - is listed in the Snapshot section of the chart.  Activities of daily living:  The patient is 100% inedpendent in all ADLs: dressing, toileting, feeding as well as independent mobility  Home safety : The patient has smoke detectors in the home. They wear seatbelts.No firearms at home ( firearms are present in the home, kept in a safe fashion). There is no violence in the home.   There is no risks for hepatitis, STDs or HIV. There is no   history of blood transfusion. They have no travel history to infectious disease endemic areas of the world.  The patient has (has not) seen their dentist in the last six month. They have (not) seen their eye doctor in the last year. They deny (admit to) any hearing difficulty and have not had audiologic testing in the last year.  They do not  have excessive sun exposure. Discussed the need for sun protection: hats, long sleeves and use of sunscreen if there is significant sun exposure.   Diet: the importance of a healthy diet is discussed. They do have a healthy (unhealthy-high fat/fast food) diet.  The patient has a regular exercise program: ___2____ , h____duration, __7___per week - yardwork.  The benefits of regular aerobic exercise were discussed.  Depression screen: there are no signs or vegative symptoms of depression- irritability, change in appetite, anhedonia, sadness/tearfullness.  Cognitive assessment: the patient manages all their financial and personal affairs and is actively engaged. They could relate day,date,year and events; recalled 3/3 objects at 3 minutes; performed clock-face test normally.  The following portions of the patient's history were reviewed and updated as appropriate: allergies, current medications,  past family history, past medical history,  past surgical history, past social history  and problem list.  Vision, hearing, body mass index were assessed and reviewed.   During the course of the visit the patient was educated and counseled about appropriate screening and preventive services including : fall prevention , diabetes screening, nutrition counseling, colorectal cancer screening, and recommended immunizations - declined.

## 2011-09-27 NOTE — Assessment & Plan Note (Signed)
Continue with current prescription therapy as reflected on the Med list.  

## 2011-09-27 NOTE — Progress Notes (Signed)
  Subjective:    Patient ID: Edward [name], male    DOB: November 04, 1933, 77 y.o.   MRN: [id number]  HPI The patient is here for a wellness exam. The patient has been doing well overall without major physical or psychological issues going on lately. The patient needs to address  chronic hypertension that has been well controlled with medicines; to address chronic  hyperlipidemia controlled with medicines as well   Review of Systems  Constitutional: Negative for appetite change, fatigue and unexpected weight change.  HENT: Negative for nosebleeds, congestion, sore throat, sneezing, trouble swallowing and neck pain.   Eyes: Negative for itching and visual disturbance.  Respiratory: Negative for cough.   Cardiovascular: Negative for chest pain, palpitations and leg swelling.  Gastrointestinal: Negative for nausea, diarrhea, blood in stool and abdominal distention.  Genitourinary: Negative for frequency and hematuria.  Musculoskeletal: Negative for back pain, joint swelling and gait problem.  Skin: Negative for rash.  Neurological: Negative for dizziness, tremors, speech difficulty and weakness.  Psychiatric/Behavioral: Negative for sleep disturbance, dysphoric mood and agitation. The patient is not nervous/anxious.        Objective:   Physical Exam  Constitutional: He is oriented to person, place, and time. He appears well-developed.  HENT:  Mouth/Throat: Oropharynx is clear and moist.  Eyes: Conjunctivae are normal. Pupils are equal, round, and reactive to light.  Neck: Normal range of motion. No JVD present. No thyromegaly present.  Cardiovascular: Normal rate, regular rhythm, normal heart sounds and intact distal pulses.  Exam reveals no gallop and no friction rub.   No murmur heard. Pulmonary/Chest: Effort normal and breath sounds normal. No respiratory distress. He has no wheezes. He has no rales. He exhibits no tenderness.  Abdominal: Soft. Bowel sounds are normal. He exhibits no  distension and no mass. There is no tenderness. There is no rebound and no guarding.  Musculoskeletal: Normal range of motion. He exhibits no edema and no tenderness.  Lymphadenopathy:    He has no cervical adenopathy.  Neurological: He is alert and oriented to person, place, and time. He has normal reflexes. No cranial nerve deficit. He exhibits normal muscle tone. Coordination normal.  Skin: Skin is warm and dry. No rash noted.  Psychiatric: He has a normal mood and affect. His behavior is normal. Judgment and thought content normal.    EKGs reviewed       Assessment & Plan:

## 2011-09-27 NOTE — Assessment & Plan Note (Signed)
Monitoring

## 2011-09-29 ENCOUNTER — Telehealth: Payer: Self-pay | Admitting: Internal Medicine

## 2011-09-29 NOTE — Telephone Encounter (Signed)
Please, mail the labs to the patient.     Thx 

## 2011-09-29 NOTE — Telephone Encounter (Signed)
done

## 2012-03-28 ENCOUNTER — Encounter: Payer: Self-pay | Admitting: Internal Medicine

## 2012-03-28 ENCOUNTER — Ambulatory Visit (INDEPENDENT_AMBULATORY_CARE_PROVIDER_SITE_OTHER): Payer: Medicare Other | Admitting: Internal Medicine

## 2012-03-28 VITALS — BP 120/74 | HR 76 | Temp 97.3°F | Resp 16 | Wt 216.0 lb

## 2012-03-28 DIAGNOSIS — E785 Hyperlipidemia, unspecified: Secondary | ICD-10-CM

## 2012-03-28 DIAGNOSIS — N289 Disorder of kidney and ureter, unspecified: Secondary | ICD-10-CM

## 2012-03-28 DIAGNOSIS — C61 Malignant neoplasm of prostate: Secondary | ICD-10-CM

## 2012-03-28 DIAGNOSIS — R7309 Other abnormal glucose: Secondary | ICD-10-CM

## 2012-03-28 DIAGNOSIS — I10 Essential (primary) hypertension: Secondary | ICD-10-CM | POA: Diagnosis not present

## 2012-03-28 DIAGNOSIS — Z0000 Encounter for general adult medical examination without abnormal findings: Secondary | ICD-10-CM

## 2012-03-28 NOTE — Assessment & Plan Note (Signed)
  On diet  

## 2012-03-28 NOTE — Assessment & Plan Note (Signed)
Dr Hyman Hopes next wk

## 2012-03-28 NOTE — Assessment & Plan Note (Signed)
Continue with current prescription therapy as reflected on the Med list.  

## 2012-03-28 NOTE — Assessment & Plan Note (Signed)
Labs incl A1c w/Dr Hyman Hopes next wk

## 2012-03-28 NOTE — Progress Notes (Signed)
Patient ID: Edward [name], male   DOB: [birth year], 78 y.o.   MRN: [id number]  Subjective:    Patient ID: Edward [name], male    DOB: 1933/04/18, 78 y.o.   MRN: [id number]  HPI  The patient needs to address  chronic hypertension and CRI that has been well controlled with medicines; to address prostate ca controlled with medicines as well; elev glucose  BP Readings from Last 3 Encounters:  03/28/12 120/74  09/27/11 118/70  04/05/11 102/70   Wt Readings from Last 3 Encounters:  03/28/12 216 lb (97.977 kg)  09/27/11 213 lb (96.616 kg)  04/05/11 215 lb (97.523 kg)      Review of Systems  Constitutional: Negative for appetite change, fatigue and unexpected weight change.  HENT: Negative for nosebleeds, congestion, sore throat, sneezing, trouble swallowing and neck pain.   Eyes: Negative for itching and visual disturbance.  Respiratory: Negative for cough.   Cardiovascular: Negative for chest pain, palpitations and leg swelling.  Gastrointestinal: Negative for nausea, diarrhea, blood in stool and abdominal distention.  Genitourinary: Negative for frequency and hematuria.  Musculoskeletal: Negative for back pain, joint swelling and gait problem.  Skin: Negative for rash.  Neurological: Negative for dizziness, tremors, speech difficulty and weakness.  Psychiatric/Behavioral: Negative for sleep disturbance, dysphoric mood and agitation. The patient is not nervous/anxious.        Objective:   Physical Exam  Constitutional: He is oriented to person, place, and time. He appears well-developed.  HENT:  Mouth/Throat: Oropharynx is clear and moist.  Eyes: Conjunctivae are normal. Pupils are equal, round, and reactive to light.  Neck: Normal range of motion. No JVD present. No thyromegaly present.  Cardiovascular: Normal rate, regular rhythm, normal heart sounds and intact distal pulses.  Exam reveals no gallop and no friction rub.   No murmur heard. Pulmonary/Chest: Effort normal and  breath sounds normal. No respiratory distress. He has no wheezes. He has no rales. He exhibits no tenderness.  Abdominal: Soft. Bowel sounds are normal. He exhibits no distension and no mass. There is no tenderness. There is no rebound and no guarding.  Musculoskeletal: Normal range of motion. He exhibits no edema and no tenderness.  Lymphadenopathy:    He has no cervical adenopathy.  Neurological: He is alert and oriented to person, place, and time. He has normal reflexes. No cranial nerve deficit. He exhibits normal muscle tone. Coordination normal.  Skin: Skin is warm and dry. No rash noted.  Psychiatric: He has a normal mood and affect. His behavior is normal. Judgment and thought content normal.    Lab Results  Component Value Date   WBC 6.2 09/27/2011   HGB 16.2 09/27/2011   HCT 49.1 09/27/2011   PLT 170.0 09/27/2011   GLUCOSE 139* 09/27/2011   CHOL 136 09/27/2011   TRIG 117.0 09/27/2011   HDL 33.00* 09/27/2011   LDLCALC 80 09/27/2011   ALT 30 09/27/2011   AST 28 09/27/2011   NA 139 09/27/2011   K 5.1 09/27/2011   CL 103 09/27/2011   CREATININE 1.6* 09/27/2011   BUN 22 09/27/2011   CO2 29 09/27/2011   TSH 1.47 09/27/2011   PSA 7.69* 09/27/2011   HGBA1C 6.8* 04/05/2011          Assessment & Plan:

## 2012-04-07 DIAGNOSIS — E119 Type 2 diabetes mellitus without complications: Secondary | ICD-10-CM | POA: Diagnosis not present

## 2012-04-07 DIAGNOSIS — I10 Essential (primary) hypertension: Secondary | ICD-10-CM | POA: Diagnosis not present

## 2012-04-07 DIAGNOSIS — N2581 Secondary hyperparathyroidism of renal origin: Secondary | ICD-10-CM | POA: Diagnosis not present

## 2012-04-07 DIAGNOSIS — D649 Anemia, unspecified: Secondary | ICD-10-CM | POA: Diagnosis not present

## 2012-04-24 DIAGNOSIS — C61 Malignant neoplasm of prostate: Secondary | ICD-10-CM | POA: Diagnosis not present

## 2012-05-01 ENCOUNTER — Other Ambulatory Visit (HOSPITAL_COMMUNITY): Payer: Self-pay | Admitting: Urology

## 2012-05-01 DIAGNOSIS — N281 Cyst of kidney, acquired: Secondary | ICD-10-CM

## 2012-05-01 DIAGNOSIS — C61 Malignant neoplasm of prostate: Secondary | ICD-10-CM | POA: Diagnosis not present

## 2012-05-01 DIAGNOSIS — N401 Enlarged prostate with lower urinary tract symptoms: Secondary | ICD-10-CM | POA: Diagnosis not present

## 2012-05-03 ENCOUNTER — Inpatient Hospital Stay (HOSPITAL_COMMUNITY)
Admission: RE | Admit: 2012-05-03 | Discharge: 2012-05-03 | Payer: Medicare Other | Source: Ambulatory Visit | Attending: Urology | Admitting: Urology

## 2012-05-10 ENCOUNTER — Ambulatory Visit (HOSPITAL_COMMUNITY)
Admission: RE | Admit: 2012-05-10 | Discharge: 2012-05-10 | Disposition: A | Discharge status: Home / Self Care | Payer: Medicare Other | Source: Ambulatory Visit | Attending: Urology | Admitting: Urology

## 2012-05-10 DIAGNOSIS — N281 Cyst of kidney, acquired: Secondary | ICD-10-CM | POA: Insufficient documentation

## 2012-05-10 DIAGNOSIS — K7689 Other specified diseases of liver: Secondary | ICD-10-CM | POA: Diagnosis not present

## 2012-05-10 MED ORDER — GADOBENATE DIMEGLUMINE 529 MG/ML IV SOLN
20.0000 mL | Freq: Once | INTRAVENOUS | Status: AC | PRN
  Administered 2012-05-10: 20 mL via INTRAVENOUS

## 2012-06-21 ENCOUNTER — Other Ambulatory Visit: Payer: Self-pay | Admitting: *Deleted

## 2012-06-21 MED ORDER — TRIAMTERENE-HCTZ 37.5-25 MG PO CAPS: 1.0000 | ORAL_CAPSULE | ORAL | Status: DC

## 2012-08-29 DIAGNOSIS — C61 Malignant neoplasm of prostate: Secondary | ICD-10-CM | POA: Diagnosis not present

## 2012-09-14 DIAGNOSIS — R972 Elevated prostate specific antigen [PSA]: Secondary | ICD-10-CM | POA: Diagnosis not present

## 2012-09-14 DIAGNOSIS — C61 Malignant neoplasm of prostate: Secondary | ICD-10-CM | POA: Diagnosis not present

## 2012-09-26 ENCOUNTER — Other Ambulatory Visit (HOSPITAL_COMMUNITY): Payer: Self-pay | Admitting: Urology

## 2012-09-26 DIAGNOSIS — C61 Malignant neoplasm of prostate: Secondary | ICD-10-CM | POA: Diagnosis not present

## 2012-09-29 ENCOUNTER — Ambulatory Visit (INDEPENDENT_AMBULATORY_CARE_PROVIDER_SITE_OTHER): Payer: Medicare Other | Admitting: Internal Medicine

## 2012-09-29 ENCOUNTER — Encounter: Payer: Self-pay | Admitting: Internal Medicine

## 2012-09-29 ENCOUNTER — Other Ambulatory Visit (INDEPENDENT_AMBULATORY_CARE_PROVIDER_SITE_OTHER): Payer: Medicare Other

## 2012-09-29 VITALS — BP 110/80 | HR 80 | Temp 97.4°F | Resp 16 | Ht 73.0 in | Wt 209.0 lb

## 2012-09-29 DIAGNOSIS — M545 Low back pain, unspecified: Secondary | ICD-10-CM

## 2012-09-29 DIAGNOSIS — K635 Polyp of colon: Secondary | ICD-10-CM

## 2012-09-29 DIAGNOSIS — Z0000 Encounter for general adult medical examination without abnormal findings: Secondary | ICD-10-CM | POA: Diagnosis not present

## 2012-09-29 DIAGNOSIS — N289 Disorder of kidney and ureter, unspecified: Secondary | ICD-10-CM | POA: Diagnosis not present

## 2012-09-29 DIAGNOSIS — R7309 Other abnormal glucose: Secondary | ICD-10-CM

## 2012-09-29 DIAGNOSIS — R739 Hyperglycemia, unspecified: Secondary | ICD-10-CM

## 2012-09-29 DIAGNOSIS — C61 Malignant neoplasm of prostate: Secondary | ICD-10-CM | POA: Insufficient documentation

## 2012-09-29 DIAGNOSIS — I10 Essential (primary) hypertension: Secondary | ICD-10-CM

## 2012-09-29 DIAGNOSIS — E785 Hyperlipidemia, unspecified: Secondary | ICD-10-CM

## 2012-09-29 DIAGNOSIS — D126 Benign neoplasm of colon, unspecified: Secondary | ICD-10-CM

## 2012-09-29 LAB — CBC WITH DIFFERENTIAL/PLATELET
Basophils Relative: 0.3 % (ref 0.0–3.0)
Eosinophils Absolute: 0.1 10*3/uL (ref 0.0–0.7)
Eosinophils Relative: 2.1 % (ref 0.0–5.0)
HCT: 48.3 % (ref 39.0–52.0)
Lymphs Abs: 2 10*3/uL (ref 0.7–4.0)
MCHC: 32.8 g/dL (ref 30.0–36.0)
MCV: 96.4 fl (ref 78.0–100.0)
Monocytes Absolute: 0.5 10*3/uL (ref 0.1–1.0)
RBC: 5.01 Mil/uL (ref 4.22–5.81)
WBC: 5.9 10*3/uL (ref 4.5–10.5)

## 2012-09-29 LAB — HEPATIC FUNCTION PANEL
AST: 27 U/L (ref 0–37)
Alkaline Phosphatase: 38 U/L — ABNORMAL LOW (ref 39–117)
Bilirubin, Direct: 0.1 mg/dL (ref 0.0–0.3)
Total Bilirubin: 0.6 mg/dL (ref 0.3–1.2)

## 2012-09-29 LAB — URINALYSIS, ROUTINE W REFLEX MICROSCOPIC
Specific Gravity, Urine: 1.02 (ref 1.000–1.030)
Total Protein, Urine: NEGATIVE
Urine Glucose: NEGATIVE
Urobilinogen, UA: 1 (ref 0.0–1.0)
pH: 6 (ref 5.0–8.0)

## 2012-09-29 LAB — BASIC METABOLIC PANEL
BUN: 23 mg/dL (ref 6–23)
Calcium: 8.9 mg/dL (ref 8.4–10.5)
Creatinine, Ser: 1.6 mg/dL — ABNORMAL HIGH (ref 0.4–1.5)
GFR: 52.6 mL/min — ABNORMAL LOW (ref >60.00)
Glucose, Bld: 129 mg/dL — ABNORMAL HIGH (ref 70–99)

## 2012-09-29 LAB — HEMOGLOBIN A1C: Hgb A1c MFr Bld: 6.6 % — ABNORMAL HIGH (ref 4.6–6.5)

## 2012-09-29 LAB — LIPID PANEL: Total CHOL/HDL Ratio: 4

## 2012-09-29 LAB — PSA: PSA: 15.38 ng/mL — ABNORMAL HIGH (ref 0.10–4.00)

## 2012-09-29 MED ORDER — OLMESARTAN MEDOXOMIL 40 MG PO TABS: 40.0000 mg | ORAL_TABLET | Freq: Every day | ORAL | Status: DC

## 2012-09-29 MED ORDER — FOLIC ACID 1 MG PO TABS: 1.0000 mg | ORAL_TABLET | Freq: Every day | ORAL | Status: DC

## 2012-09-29 MED ORDER — TRIAMTERENE-HCTZ 37.5-25 MG PO CAPS: 1.0000 | ORAL_CAPSULE | ORAL | Status: DC

## 2012-09-29 MED ORDER — NIACIN ER (ANTIHYPERLIPIDEMIC) 500 MG PO TBCR: 500.0000 mg | EXTENDED_RELEASE_TABLET | Freq: Every day | ORAL | Status: DC

## 2012-09-29 MED ORDER — ERGOCALCIFEROL 1.25 MG (50000 UT) PO CAPS: 50000.0000 [IU] | ORAL_CAPSULE | ORAL | Status: DC

## 2012-09-29 NOTE — Assessment & Plan Note (Signed)
Per Dr Elenore Paddy

## 2012-09-29 NOTE — Assessment & Plan Note (Signed)
Labs

## 2012-09-29 NOTE — Assessment & Plan Note (Signed)
Continue with current prescription therapy as reflected on the Med list.  

## 2012-09-29 NOTE — Assessment & Plan Note (Signed)
Off and on pains

## 2012-09-29 NOTE — Progress Notes (Signed)
   Subjective:    Patient ID: Edward [name], male    DOB: Sep 09, 1933, 78 y.o.   MRN: [id number]  HPI The patient is here for a wellness exam. The patient has been doing well overall without major physical or psychological issues going on lately, except for the prostate ca return...  The patient needs to address  chronic hypertension that has been well controlled with medicines; to address chronic  hyperlipidemia controlled with medicines as well  BP Readings from Last 3 Encounters:  09/29/12 110/80  03/28/12 120/74  09/27/11 118/70   Wt Readings from Last 3 Encounters:  09/29/12 209 lb (94.802 kg)  03/28/12 216 lb (97.977 kg)  09/27/11 213 lb (96.616 kg)       Review of Systems  Constitutional: Negative for appetite change, fatigue and unexpected weight change.  HENT: Negative for nosebleeds, congestion, sore throat, sneezing, trouble swallowing and neck pain.   Eyes: Negative for itching and visual disturbance.  Respiratory: Negative for cough.   Cardiovascular: Negative for chest pain, palpitations and leg swelling.  Gastrointestinal: Negative for nausea, diarrhea, blood in stool and abdominal distention.  Genitourinary: Negative for frequency and hematuria.  Musculoskeletal: Negative for back pain, joint swelling and gait problem.  Skin: Negative for rash.  Neurological: Negative for dizziness, tremors, speech difficulty and weakness.  Psychiatric/Behavioral: Negative for disturbed wake/sleep cycle, dysphoric mood and agitation. The patient is not nervous/anxious.        Objective:   Physical Exam  Constitutional: He is oriented to person, place, and time. He appears well-developed.  HENT:  Mouth/Throat: Oropharynx is clear and moist.  Eyes: Conjunctivae normal are normal. Pupils are equal, round, and reactive to light.  Neck: Normal range of motion. No JVD present. No thyromegaly present.  Cardiovascular: Normal rate, regular rhythm, normal heart sounds and intact  distal pulses.  Exam reveals no gallop and no friction rub.   No murmur heard. Pulmonary/Chest: Effort normal and breath sounds normal. No respiratory distress. He has no wheezes. He has no rales. He exhibits no tenderness.  Abdominal: Soft. Bowel sounds are normal. He exhibits no distension and no mass. There is no tenderness. There is no rebound and no guarding.  Musculoskeletal: Normal range of motion. He exhibits no edema and no tenderness.  Lymphadenopathy:    He has no cervical adenopathy.  Neurological: He is alert and oriented to person, place, and time. He has normal reflexes. No cranial nerve deficit. He exhibits normal muscle tone. Coordination normal.  Skin: Skin is warm and dry. No rash noted.  Psychiatric: He has a normal mood and affect. His behavior is normal. Judgment and thought content normal.    EKGs reviewed       Assessment & Plan:

## 2012-09-29 NOTE — Assessment & Plan Note (Signed)
The patient is here for annual Medicare wellness examination and management of other chronic and acute problems.   The risk factors are reflected in the social history.  The roster of all physicians providing medical care to patient - is listed in the Snapshot section of the chart.  Activities of daily living:  The patient is 100% inedpendent in all ADLs: dressing, toileting, feeding as well as independent mobility  Home safety : The patient has smoke detectors in the home. They wear seatbelts.No firearms at home ( firearms are present in the home, kept in a safe fashion). There is no violence in the home.   There is no risks for hepatitis, STDs or HIV. There is no   history of blood transfusion. They have no travel history to infectious disease endemic areas of the world.  The patient has (has not) seen their dentist in the last six month. They have (not) seen their eye doctor in the last year. They deny (admit to) any hearing difficulty and have not had audiologic testing in the last year.  They do not  have excessive sun exposure. Discussed the need for sun protection: hats, long sleeves and use of sunscreen if there is significant sun exposure.   Diet: the importance of a healthy diet is discussed. They do have a healthy (unhealthy-high fat/fast food) diet.  The patient has a regular exercise program: ___2____ , h____duration, __7___per week - yardwork.  The benefits of regular aerobic exercise were discussed.  Depression screen: there are no signs or vegative symptoms of depression- irritability, change in appetite, anhedonia, sadness/tearfullness.  Cognitive assessment: the patient manages all their financial and personal affairs and is actively engaged. They could relate day,date,year and events; recalled 3/3 objects at 3 minutes; performed clock-face test normally.  The following portions of the patient's history were reviewed and updated as appropriate: allergies, current medications,  past family history, past medical history,  past surgical history, past social history  and problem list.  Vision, hearing, body mass index were assessed and reviewed.   During the course of the visit the patient was educated and counseled about appropriate screening and preventive services including : fall prevention , diabetes screening, nutrition counseling, colorectal cancer screening, and recommended immunizations - declined.  Refused shots

## 2012-09-30 ENCOUNTER — Telehealth: Payer: Self-pay | Admitting: Internal Medicine

## 2012-09-30 NOTE — Telephone Encounter (Signed)
Edward [name], please, inform patient that all labs are normal except for elev PSA and stable mildly decreased kidney function.  Please, mail the labs to the patient.   Thx

## 2012-10-01 ENCOUNTER — Encounter: Payer: Self-pay | Admitting: Internal Medicine

## 2012-10-01 NOTE — Assessment & Plan Note (Signed)
1998 10/13 - relapsed -- s/p bx Options to treat to be discussed soon

## 2012-10-02 NOTE — Telephone Encounter (Signed)
Pt's wife informed. Copies mailed. 

## 2012-10-12 ENCOUNTER — Encounter (HOSPITAL_COMMUNITY)
Admission: RE | Admit: 2012-10-12 | Discharge: 2012-10-12 | Disposition: A | Discharge status: Home / Self Care | Payer: Medicare Other | Source: Ambulatory Visit | Attending: Urology | Admitting: Urology

## 2012-10-12 ENCOUNTER — Ambulatory Visit (HOSPITAL_COMMUNITY)
Admission: RE | Admit: 2012-10-12 | Discharge: 2012-10-12 | Disposition: A | Discharge status: Home / Self Care | Payer: Medicare Other | Source: Ambulatory Visit | Attending: Diagnostic Radiology | Admitting: Diagnostic Radiology

## 2012-10-12 ENCOUNTER — Other Ambulatory Visit (HOSPITAL_COMMUNITY): Payer: Self-pay | Admitting: Diagnostic Radiology

## 2012-10-12 DIAGNOSIS — M25569 Pain in unspecified knee: Secondary | ICD-10-CM | POA: Diagnosis not present

## 2012-10-12 DIAGNOSIS — Z8546 Personal history of malignant neoplasm of prostate: Secondary | ICD-10-CM | POA: Diagnosis not present

## 2012-10-12 DIAGNOSIS — Z09 Encounter for follow-up examination after completed treatment for conditions other than malignant neoplasm: Secondary | ICD-10-CM

## 2012-10-12 DIAGNOSIS — N400 Enlarged prostate without lower urinary tract symptoms: Secondary | ICD-10-CM | POA: Diagnosis not present

## 2012-10-12 DIAGNOSIS — C61 Malignant neoplasm of prostate: Secondary | ICD-10-CM | POA: Diagnosis not present

## 2012-10-12 DIAGNOSIS — M1710 Unilateral primary osteoarthritis, unspecified knee: Secondary | ICD-10-CM | POA: Insufficient documentation

## 2012-10-12 DIAGNOSIS — IMO0002 Reserved for concepts with insufficient information to code with codable children: Secondary | ICD-10-CM | POA: Insufficient documentation

## 2012-10-12 MED ORDER — TECHNETIUM TC 99M MEDRONATE IV KIT
25.0000 | PACK | Freq: Once | INTRAVENOUS | Status: AC | PRN
  Administered 2012-10-12: 25 via INTRAVENOUS

## 2012-10-24 DIAGNOSIS — C61 Malignant neoplasm of prostate: Secondary | ICD-10-CM | POA: Diagnosis not present

## 2012-10-25 ENCOUNTER — Other Ambulatory Visit: Payer: Self-pay | Admitting: Urology

## 2012-11-14 DIAGNOSIS — N401 Enlarged prostate with lower urinary tract symptoms: Secondary | ICD-10-CM | POA: Diagnosis not present

## 2012-11-14 DIAGNOSIS — C61 Malignant neoplasm of prostate: Secondary | ICD-10-CM | POA: Diagnosis not present

## 2012-11-27 ENCOUNTER — Encounter (HOSPITAL_BASED_OUTPATIENT_CLINIC_OR_DEPARTMENT_OTHER): Payer: Self-pay | Admitting: *Deleted

## 2012-11-27 NOTE — Progress Notes (Addendum)
SPOKE W/ WIFE. NPO AFTER MN. ARRIVES AT 0945. NEEDS ISTAT AND EKG. PT HX LOW PLT COUNT, LAST CBC 10--10-2012, PLT NORMAL, RESULTS W/ CHART.

## 2012-12-01 ENCOUNTER — Ambulatory Visit (HOSPITAL_BASED_OUTPATIENT_CLINIC_OR_DEPARTMENT_OTHER)
Admission: RE | Admit: 2012-12-01 | Discharge: 2012-12-01 | Disposition: A | Discharge status: Home / Self Care | Payer: Medicare Other | Source: Ambulatory Visit | Attending: Urology | Admitting: Urology

## 2012-12-01 ENCOUNTER — Encounter (HOSPITAL_BASED_OUTPATIENT_CLINIC_OR_DEPARTMENT_OTHER): Payer: Self-pay | Admitting: *Deleted

## 2012-12-01 ENCOUNTER — Ambulatory Visit (HOSPITAL_BASED_OUTPATIENT_CLINIC_OR_DEPARTMENT_OTHER): Payer: Medicare Other | Admitting: Anesthesiology

## 2012-12-01 ENCOUNTER — Encounter (HOSPITAL_BASED_OUTPATIENT_CLINIC_OR_DEPARTMENT_OTHER): Payer: Self-pay | Admitting: Anesthesiology

## 2012-12-01 ENCOUNTER — Encounter (HOSPITAL_BASED_OUTPATIENT_CLINIC_OR_DEPARTMENT_OTHER)
Admission: RE | Disposition: A | Discharge status: Home / Self Care | Payer: Self-pay | Source: Ambulatory Visit | Attending: Urology

## 2012-12-01 DIAGNOSIS — I129 Hypertensive chronic kidney disease with stage 1 through stage 4 chronic kidney disease, or unspecified chronic kidney disease: Secondary | ICD-10-CM | POA: Diagnosis not present

## 2012-12-01 DIAGNOSIS — N3289 Other specified disorders of bladder: Secondary | ICD-10-CM | POA: Insufficient documentation

## 2012-12-01 DIAGNOSIS — C61 Malignant neoplasm of prostate: Secondary | ICD-10-CM | POA: Insufficient documentation

## 2012-12-01 DIAGNOSIS — N189 Chronic kidney disease, unspecified: Secondary | ICD-10-CM | POA: Insufficient documentation

## 2012-12-01 DIAGNOSIS — E785 Hyperlipidemia, unspecified: Secondary | ICD-10-CM | POA: Insufficient documentation

## 2012-12-01 HISTORY — PX: CRYOABLATION: SHX1415

## 2012-12-01 HISTORY — DX: Nocturia: R35.1

## 2012-12-01 HISTORY — DX: Cyst of kidney, acquired: N28.1

## 2012-12-01 HISTORY — DX: Chronic kidney disease, unspecified: N18.9

## 2012-12-01 HISTORY — DX: Malignant neoplasm of prostate: C61

## 2012-12-01 LAB — POCT I-STAT, CHEM 8
BUN: 25 mg/dL — ABNORMAL HIGH (ref 6–23)
Calcium, Ion: 1.17 mmol/L (ref 1.13–1.30)
Chloride: 101 mEq/L (ref 96–112)
Glucose, Bld: 131 mg/dL — ABNORMAL HIGH (ref 70–99)

## 2012-12-01 SURGERY — CRYOABLATION, PROSTATE
Anesthesia: General | Site: Prostate | Wound class: Clean Contaminated

## 2012-12-01 MED ORDER — ACETAMINOPHEN 10 MG/ML IV SOLN
INTRAVENOUS | Status: DC | PRN
  Administered 2012-12-01: 1000 mg via INTRAVENOUS

## 2012-12-01 MED ORDER — LIDOCAINE HCL (CARDIAC) 20 MG/ML IV SOLN
INTRAVENOUS | Status: DC | PRN
  Administered 2012-12-01: 60 mg via INTRAVENOUS

## 2012-12-01 MED ORDER — FENTANYL CITRATE 0.05 MG/ML IJ SOLN
25.0000 ug | INTRAMUSCULAR | Status: DC | PRN
  Administered 2012-12-01: 50 ug via INTRAVENOUS
  Administered 2012-12-01: 25 ug via INTRAVENOUS

## 2012-12-01 MED ORDER — ONDANSETRON HCL 4 MG/2ML IJ SOLN
INTRAMUSCULAR | Status: DC | PRN
  Administered 2012-12-01: 4 mg via INTRAVENOUS

## 2012-12-01 MED ORDER — STERILE WATER FOR IRRIGATION IR SOLN
Status: DC | PRN
  Administered 2012-12-01: 6000 mL

## 2012-12-01 MED ORDER — CIPROFLOXACIN HCL 500 MG PO TABS: 500.0000 mg | ORAL_TABLET | Freq: Two times a day (BID) | ORAL | Status: DC

## 2012-12-01 MED ORDER — HYDROCODONE-ACETAMINOPHEN 5-500 MG PO CAPS: 1.0000 | ORAL_CAPSULE | Freq: Four times a day (QID) | ORAL | Status: DC | PRN

## 2012-12-01 MED ORDER — PROPOFOL 10 MG/ML IV BOLUS
INTRAVENOUS | Status: DC | PRN
  Administered 2012-12-01: 50 mg via INTRAVENOUS
  Administered 2012-12-01: 60 mg via INTRAVENOUS
  Administered 2012-12-01: 200 mg via INTRAVENOUS

## 2012-12-01 MED ORDER — HYDROCODONE-ACETAMINOPHEN 5-325 MG PO TABS
1.0000 | ORAL_TABLET | Freq: Four times a day (QID) | ORAL | Status: AC | PRN
  Administered 2012-12-01: 1 via ORAL
  Filled 2012-12-01: qty 1

## 2012-12-01 MED ORDER — LACTATED RINGERS IV SOLN
INTRAVENOUS | Status: DC
  Administered 2012-12-01 (×2): via INTRAVENOUS
  Administered 2012-12-01: 100 mL/h via INTRAVENOUS
  Filled 2012-12-01: qty 1000

## 2012-12-01 MED ORDER — CIPROFLOXACIN IN D5W 400 MG/200ML IV SOLN
400.0000 mg | INTRAVENOUS | Status: AC
  Administered 2012-12-01: 400 mg via INTRAVENOUS
  Filled 2012-12-01: qty 200

## 2012-12-01 MED ORDER — SUCCINYLCHOLINE CHLORIDE 20 MG/ML IJ SOLN
INTRAMUSCULAR | Status: DC | PRN
  Administered 2012-12-01: 120 mg via INTRAVENOUS

## 2012-12-01 MED ORDER — FENTANYL CITRATE 0.05 MG/ML IJ SOLN
INTRAMUSCULAR | Status: DC | PRN
  Administered 2012-12-01: 25 ug via INTRAVENOUS
  Administered 2012-12-01: 50 ug via INTRAVENOUS

## 2012-12-01 MED ORDER — DEXAMETHASONE SODIUM PHOSPHATE 4 MG/ML IJ SOLN
INTRAMUSCULAR | Status: DC | PRN
  Administered 2012-12-01: 8 mg via INTRAVENOUS

## 2012-12-01 MED ORDER — EPHEDRINE SULFATE 50 MG/ML IJ SOLN
INTRAMUSCULAR | Status: DC | PRN
  Administered 2012-12-01 (×2): 10 mg via INTRAVENOUS
  Administered 2012-12-01 (×2): 5 mg via INTRAVENOUS

## 2012-12-01 SURGICAL SUPPLY — 27 items
BAG URINE DRAINAGE (UROLOGICAL SUPPLIES) ×2 IMPLANT
BAG URINE LEG 19OZ MD ST LTX (BAG) IMPLANT
BANDAGE CONFORM 3  STR LF (GAUZE/BANDAGES/DRESSINGS) IMPLANT
CANISTER SUCTION 2500CC (MISCELLANEOUS) IMPLANT
CATH FOLEY 2WAY SLVR  5CC 18FR (CATHETERS) ×1
CATH FOLEY 2WAY SLVR 5CC 18FR (CATHETERS) ×1 IMPLANT
CHARGE TECH PROCEDURE ONCURA (LABOR (TRAVEL & OVERTIME)) ×2 IMPLANT
CLOTH BEACON ORANGE TIMEOUT ST (SAFETY) ×2 IMPLANT
COVER MAYO STAND STRL (DRAPES) ×2 IMPLANT
DRAPE CAMERA CLOSED 9X96 (DRAPES) IMPLANT
DRSG TEGADERM 4X4.75 (GAUZE/BANDAGES/DRESSINGS) ×2 IMPLANT
DRSG TEGADERM 8X12 (GAUZE/BANDAGES/DRESSINGS) ×4 IMPLANT
GAS ARGON HIGH PRESSURE (MEDICAL GASES) ×2 IMPLANT
GLOVE BIO SURGEON STRL SZ7 (GLOVE) ×2 IMPLANT
GOWN PREVENTION PLUS LG XLONG (DISPOSABLE) ×2 IMPLANT
GUIDEWIRE SUPER STIFF (WIRE) ×2 IMPLANT
HOLDER FOLEY CATH W/STRAP (MISCELLANEOUS) ×2 IMPLANT
KIT PROSTATE PRESICE I (KITS) ×2 IMPLANT
KIT SUPRAPUBIC CATH (MISCELLANEOUS) ×2 IMPLANT
NEEDLE SPNL 18GX3.5 QUINCKE PK (NEEDLE) IMPLANT
PACK BASIN DAY SURGERY FS (CUSTOM PROCEDURE TRAY) ×2 IMPLANT
PACK CYSTOSCOPY (CUSTOM PROCEDURE TRAY) ×2 IMPLANT
PLUG CATH AND CAP STER (CATHETERS) ×2 IMPLANT
SPONGE GAUZE 4X4 12PLY (GAUZE/BANDAGES/DRESSINGS) ×2 IMPLANT
SYRINGE IRR TOOMEY STRL 70CC (SYRINGE) ×2 IMPLANT
UNDERPAD 30X30 INCONTINENT (UNDERPADS AND DIAPERS) ×2 IMPLANT
WATER STERILE IRR 500ML POUR (IV SOLUTION) ×2 IMPLANT

## 2012-12-01 NOTE — Anesthesia Preprocedure Evaluation (Signed)
Anesthesia Evaluation  Patient identified by MRN, date of birth, ID band Patient awake    Reviewed: Allergy & Precautions, H&P , NPO status , Patient's Chart, lab work & pertinent test results  Airway Mallampati: II TM Distance: <3 FB Neck ROM: Full    Dental No notable dental hx.    Pulmonary neg pulmonary ROS,  breath sounds clear to auscultation  Pulmonary exam normal       Cardiovascular hypertension, Pt. on medications Rhythm:Regular Rate:Normal     Neuro/Psych negative neurological ROS  negative psych ROS   GI/Hepatic negative GI ROS, Neg liver ROS,   Endo/Other  negative endocrine ROS  Renal/GU Renal InsufficiencyRenal disease  negative genitourinary   Musculoskeletal negative musculoskeletal ROS (+)   Abdominal   Peds negative pediatric ROS (+)  Hematology negative hematology ROS (+)   Anesthesia Other Findings   Reproductive/Obstetrics negative OB ROS                           Anesthesia Physical Anesthesia Plan  ASA: II  Anesthesia Plan: General   Post-op Pain Management:    Induction: Intravenous  Airway Management Planned: LMA  Additional Equipment:   Intra-op Plan:   Post-operative Plan:   Informed Consent: I have reviewed the patients History and Physical, chart, labs and discussed the procedure including the risks, benefits and alternatives for the proposed anesthesia with the patient or authorized representative who has indicated his/her understanding and acceptance.   Dental advisory given  Plan Discussed with: CRNA and Surgeon  Anesthesia Plan Comments:         Anesthesia Quick Evaluation

## 2012-12-01 NOTE — Addendum Note (Signed)
Addendum  created 12/01/12 1418 by Azell Der, MD   Modules edited:Orders, PRL Based Order Sets

## 2012-12-01 NOTE — Anesthesia Procedure Notes (Signed)
Procedure Name: Intubation Date/Time: 12/01/2012 11:48 AM Performed by: Renella Cunas D Pre-anesthesia Checklist: Patient identified, Emergency Drugs available, Suction available and Patient being monitored Patient Re-evaluated:Patient Re-evaluated prior to inductionOxygen Delivery Method: Circle System Utilized Preoxygenation: Pre-oxygenation with 100% oxygen Intubation Type: IV induction Ventilation: Mask ventilation without difficulty Laryngoscope Size: Mac and 4 Grade View: Grade I Tube type: Oral Tube size: 8.0 mm Number of attempts: 1 Airway Equipment and Method: stylet,  oral airway and Stylet Placement Confirmation: ETT inserted through vocal cords under direct vision,  positive ETCO2 and breath sounds checked- equal and bilateral Secured at: 22 cm Tube secured with: Tape Dental Injury: Teeth and Oropharynx as per pre-operative assessment  Comments: Placement of #5 LMA and #5 LMA Supreme attempted by L. Lugene Hitt and Dr. Okey Dupre.  Unable to give adequate tidal volume, poor seal.  Plan changed to oral intubation and pt tolerated well.

## 2012-12-01 NOTE — Transfer of Care (Signed)
Immediate Anesthesia Transfer of Care Note  Patient: Edward [name]  Procedure(s) Performed: Procedure(s) (LRB): CRYO ABLATION PROSTATE (N/A)  Patient Location: PACU  Anesthesia Type: General  Level of Consciousness: awake, oriented, sedated and patient cooperative  Airway & Oxygen Therapy: Patient Spontanous Breathing and Patient connected to face mask oxygen  Post-op Assessment: Report given to PACU RN and Post -op Vital signs reviewed and stable  Post vital signs: Reviewed and stable  Complications: No apparent anesthesia complications

## 2012-12-01 NOTE — H&P (Signed)
History and Physical  Chief Complaint: Adenocarcinoma of prostate.  History of Present Illness: Edward [name] was on active surveillance for prostate cancer.   His PSA was trending up.  Biopsy 2 years ago was negative.  Repeat biopsy this year revealed Gleason 4+3 and 4+5.  Prostate volume is 40 ml.  Bone scan is negative for metastatic disease.  CT scan abdomen and pelvis showed no pelvic or retroperitoneal adenopathy.  Treatment options were discussed with him and his wife: radiation with androgen deprivation versus cryoablation,  They elected to proceed with cryoablation.  Past Medical History  Diagnosis Date  . Hyperlipidemia   . Hypertension   . Elevated glucose   . Venous insufficiency of leg   . Drug-induced low platelet count     from NSAIDS  . Prostate cancer   . Complex renal cyst RIGHT--  MONITORED BY DR Isabel Caprice  . Chronic renal insufficiency     DR WEBB  . Nocturia    History reviewed. No pertinent past surgical history.  Medications:  Aspirin,Benicar, Folic acid, Niaspan, Multivitamins, Triamterene-HCTZ, Vit D. Allergies:  Allergies  Allergen Reactions  . Naproxen Sodium     REACTION: low plt    Family History  Problem Relation Age of Onset  . Dementia Mother   . Tuberculosis Father   . Hypertension Father    Social History:  reports that he has never smoked. He has never used smokeless tobacco. He reports that he does not drink alcohol or use illicit drugs.  ROS: All systems are reviewed and negative except as noted.   Physical Exam:  Vital signs in last 24 hours: Temp:  [97 F (36.1 C)] 97 F (36.1 C) (12/13 0954) Pulse Rate:  [73] 73  (12/13 0954) Resp:  [20] 20  (12/13 0954) BP: (124)/(70) 124/70 mmHg (12/13 0954) SpO2:  [96 %] 96 % (12/13 0954) Weight:  [215 lb (97.523 kg)] 215 lb (97.523 kg) (12/13 0954)  Cardiovascular: Skin warm; not flushed Respiratory: Breaths quiet; no shortness of breath Abdomen: No masses Neurological: Normal sensation to  touch Musculoskeletal: Normal motor function arms and legs Lymphatics: No inguinal adenopathy Skin: No rashes Genitourinary: Penis and scrotal contents are within normal limits. Laboratory Data:  Results for orders placed during the hospital encounter of 12/01/12 (from the past 24 hour(s))  POCT I-STAT, CHEM 8     Status: Abnormal   Collection Time   12/01/12 10:17 AM      Component Value Range   Sodium 140  135 - 145 mEq/L   Potassium 4.5  3.5 - 5.1 mEq/L   Chloride 101  96 - 112 mEq/L   BUN 25 (*) 6 - 23 mg/dL   Creatinine, Ser 4.09 (*) 0.50 - 1.35 mg/dL   Glucose, Bld 811 (*) 70 - 99 mg/dL   Calcium, Ion 9.14  7.82 - 1.30 mmol/L   TCO2 31  0 - 100 mmol/L   Hemoglobin 16.3  13.0 - 17.0 g/dL   HCT 95.6  21.3 - 08.6 %   Creatinine:  Basename 12/01/12 1017  CREATININE 1.40*      Impression/Assessment:  Adenocarcinoma of prostate   Plan: Cryoablation prostate.   Edward [name] 12/01/2012, 10:56 AM

## 2012-12-01 NOTE — Anesthesia Postprocedure Evaluation (Signed)
  Anesthesia Post-op Note  Patient: Edward [name]  Procedure(s) Performed: Procedure(s) (LRB): CRYO ABLATION PROSTATE (N/A)  Patient Location: PACU  Anesthesia Type: General  Level of Consciousness: awake and alert   Airway and Oxygen Therapy: Patient Spontanous Breathing  Post-op Pain: mild  Post-op Assessment: Post-op Vital signs reviewed, Patient's Cardiovascular Status Stable, Respiratory Function Stable, Patent Airway and No signs of Nausea or vomiting  Last Vitals:  Filed Vitals:   12/01/12 1400  BP: 133/71  Pulse: 83  Temp: 35.9 C  Resp: 14    Post-op Vital Signs: stable   Complications: No apparent anesthesia complications

## 2012-12-01 NOTE — Op Note (Signed)
Edward [name] is a 78 y.o.   12/01/2012  Pre-op diagnosis: Adenocarcinoma of prostate  Postop diagnosis: Same  Procedure done: Cystoscopy, cryoablation of prostate  Surgeon: Wendie Simmer. Neya Creegan  Anesthesia: General  Indication: Patient is a 78 years old male patient of Dr. grip is who was on active showed evidence for adenocarcinoma of the prostate. His PSA was draining at. Prostate biopsy 2 years ago was negative. Repeat biopsy this year revealed a Gleason 4+3 and 4+5. His PSA is a 6. Bone scan is negative for metastatic disease. CT scan in abdomen and pelvis showed no evidence of pelvic or retroperitoneal adenopathy. Treatment options were discussed with the patient and his wife: Radiation therapy with androgen deprivation versus cryoablation. They elected to proceed with cryoablation. He is scheduled today for the procedure.  Procedure: The patient was identified by his wrist band and proper timeout was taken.  Under general anesthesia patient was prepped and draped and placed in the dorsolithotomy position. A #16 French Foley catheter was inserted in the bladder. Then ice rods were placed in the prostate : 2 in the anterior lobe, 2 in the middle lobe and 4 in the posterior lobe. All needles were were advanced up to the level of the bladder neck.  A temperature probe needle was placed at the external sphincter.  Another temperature needle could not be precisely placed at the Denonvilliers fascia and it was left in the posterior lobe of the prostate. Then the Foley catheter was removed. A flexible cystoscope was inserted in the bladder the anterior urethra is normal. There is  trilobar prostatic hypertrophy. The bladder is moderately trabeculated. There is no stone or tumor in the bladder. The ureteral orifices are in normal position and shape. In retroflexion there was no evidence of penetration of the bladder neck with the ice rods. A super stiff wire was then passed through the cystoscope and and  the cystoscope removed. A urethral warmer catheter was passed over the wire in the bladder.  The first freeze cycle was then started on the anterior lobe, then the middle lobe and in the posterior lobe. A nice ice ball was formed and the first freeze cycle was then stopped. A thaw cycle was then started and lasted about 5 minutes. Then the second freeze cycle was started first on the anterior lobe, then the middle lobe and then the posterior lobe. After completion of the second freeze cycle the thaw cycle was started. Then the ice rods were removed. The urethral warmer catheter was left in place for about 20 minutes. Then a #16 [place] Foley catheter was inserted in the bladder.  EBL: Minimal  The patient tolerated the procedure well and left the OR in satisfactory condition to postanesthesia care unit

## 2012-12-04 ENCOUNTER — Encounter (HOSPITAL_BASED_OUTPATIENT_CLINIC_OR_DEPARTMENT_OTHER): Payer: Self-pay | Admitting: Urology

## 2012-12-15 DIAGNOSIS — C61 Malignant neoplasm of prostate: Secondary | ICD-10-CM | POA: Diagnosis not present

## 2012-12-20 HISTORY — PX: REPAIR KNEE LIGAMENT: SUR1188

## 2013-03-08 DIAGNOSIS — C61 Malignant neoplasm of prostate: Secondary | ICD-10-CM | POA: Diagnosis not present

## 2013-03-15 DIAGNOSIS — C61 Malignant neoplasm of prostate: Secondary | ICD-10-CM | POA: Diagnosis not present

## 2013-03-30 DIAGNOSIS — D649 Anemia, unspecified: Secondary | ICD-10-CM | POA: Diagnosis not present

## 2013-03-30 DIAGNOSIS — I10 Essential (primary) hypertension: Secondary | ICD-10-CM | POA: Diagnosis not present

## 2013-03-30 DIAGNOSIS — N390 Urinary tract infection, site not specified: Secondary | ICD-10-CM | POA: Diagnosis not present

## 2013-03-30 DIAGNOSIS — N183 Chronic kidney disease, stage 3 unspecified: Secondary | ICD-10-CM | POA: Diagnosis not present

## 2013-03-30 DIAGNOSIS — I129 Hypertensive chronic kidney disease with stage 1 through stage 4 chronic kidney disease, or unspecified chronic kidney disease: Secondary | ICD-10-CM | POA: Diagnosis not present

## 2013-04-03 ENCOUNTER — Encounter: Payer: Self-pay | Admitting: Internal Medicine

## 2013-04-03 ENCOUNTER — Ambulatory Visit (INDEPENDENT_AMBULATORY_CARE_PROVIDER_SITE_OTHER): Payer: Medicare Other | Admitting: Internal Medicine

## 2013-04-03 VITALS — BP 118/70 | HR 72 | Temp 97.9°F | Resp 16 | Wt 211.0 lb

## 2013-04-03 DIAGNOSIS — I10 Essential (primary) hypertension: Secondary | ICD-10-CM | POA: Diagnosis not present

## 2013-04-03 DIAGNOSIS — R7309 Other abnormal glucose: Secondary | ICD-10-CM | POA: Diagnosis not present

## 2013-04-03 DIAGNOSIS — C61 Malignant neoplasm of prostate: Secondary | ICD-10-CM | POA: Diagnosis not present

## 2013-04-03 DIAGNOSIS — N289 Disorder of kidney and ureter, unspecified: Secondary | ICD-10-CM | POA: Diagnosis not present

## 2013-04-03 NOTE — Assessment & Plan Note (Signed)
Continue with current prescription therapy as reflected on the Med list.  

## 2013-04-03 NOTE — Progress Notes (Signed)
   Subjective:    Patient ID: Edward [name], male    DOB: Jan 23, 1933, 79 y.o.   MRN: [id number]  HPI The patient has been doing well overall without major physical or psychological issues going on lately, except for the prostate ca return - he had a prostate cryo in Dec 2013...  The patient needs to address  chronic hypertension that has been well controlled with medicines; to address chronic  hyperlipidemia controlled with medicines as well  BP Readings from Last 3 Encounters:  04/03/13 118/70  12/01/12 140/80  12/01/12 140/80   Wt Readings from Last 3 Encounters:  04/03/13 211 lb (95.709 kg)  12/01/12 215 lb (97.523 kg)  12/01/12 215 lb (97.523 kg)       Review of Systems  Constitutional: Negative for appetite change, fatigue and unexpected weight change.  HENT: Negative for nosebleeds, congestion, sore throat, sneezing, trouble swallowing and neck pain.   Eyes: Negative for itching and visual disturbance.  Respiratory: Negative for cough.   Cardiovascular: Negative for chest pain, palpitations and leg swelling.  Gastrointestinal: Negative for nausea, diarrhea, blood in stool and abdominal distention.  Genitourinary: Negative for frequency and hematuria.  Musculoskeletal: Negative for back pain, joint swelling and gait problem.  Skin: Negative for rash.  Neurological: Negative for dizziness, tremors, speech difficulty and weakness.  Psychiatric/Behavioral: Negative for sleep disturbance, dysphoric mood and agitation. The patient is not nervous/anxious.        Objective:   Physical Exam  Constitutional: He is oriented to person, place, and time. He appears well-developed.  HENT:  Mouth/Throat: Oropharynx is clear and moist.  Eyes: Conjunctivae are normal. Pupils are equal, round, and reactive to light.  Neck: Normal range of motion. No JVD present. No thyromegaly present.  Cardiovascular: Normal rate, regular rhythm, normal heart sounds and intact distal pulses.  Exam  reveals no gallop and no friction rub.   No murmur heard. Pulmonary/Chest: Effort normal and breath sounds normal. No respiratory distress. He has no wheezes. He has no rales. He exhibits no tenderness.  Abdominal: Soft. Bowel sounds are normal. He exhibits no distension and no mass. There is no tenderness. There is no rebound and no guarding.  Musculoskeletal: Normal range of motion. He exhibits no edema and no tenderness.  Lymphadenopathy:    He has no cervical adenopathy.  Neurological: He is alert and oriented to person, place, and time. He has normal reflexes. No cranial nerve deficit. He exhibits normal muscle tone. Coordination normal.  Skin: Skin is warm and dry. No rash noted.  Psychiatric: He has a normal mood and affect. His behavior is normal. Judgment and thought content normal.          Assessment & Plan:

## 2013-04-03 NOTE — Assessment & Plan Note (Signed)
Watching labs 

## 2013-04-03 NOTE — Assessment & Plan Note (Signed)
Per nephrology 

## 2013-04-03 NOTE — Assessment & Plan Note (Signed)
12/13 Cryo Dr Brunilda Payor s/p

## 2013-04-12 DIAGNOSIS — N281 Cyst of kidney, acquired: Secondary | ICD-10-CM | POA: Diagnosis not present

## 2013-06-11 DIAGNOSIS — C61 Malignant neoplasm of prostate: Secondary | ICD-10-CM | POA: Diagnosis not present

## 2013-06-18 DIAGNOSIS — C61 Malignant neoplasm of prostate: Secondary | ICD-10-CM | POA: Diagnosis not present

## 2013-08-15 ENCOUNTER — Other Ambulatory Visit: Payer: Self-pay | Admitting: Internal Medicine

## 2013-09-11 DIAGNOSIS — C61 Malignant neoplasm of prostate: Secondary | ICD-10-CM | POA: Diagnosis not present

## 2013-09-18 DIAGNOSIS — N390 Urinary tract infection, site not specified: Secondary | ICD-10-CM | POA: Diagnosis not present

## 2013-09-18 DIAGNOSIS — C61 Malignant neoplasm of prostate: Secondary | ICD-10-CM | POA: Diagnosis not present

## 2013-10-03 ENCOUNTER — Other Ambulatory Visit (INDEPENDENT_AMBULATORY_CARE_PROVIDER_SITE_OTHER): Payer: Medicare Other

## 2013-10-03 ENCOUNTER — Encounter: Payer: Self-pay | Admitting: Internal Medicine

## 2013-10-03 ENCOUNTER — Ambulatory Visit (INDEPENDENT_AMBULATORY_CARE_PROVIDER_SITE_OTHER): Payer: Medicare Other | Admitting: Internal Medicine

## 2013-10-03 VITALS — BP 136/78 | HR 72 | Temp 98.2°F | Resp 16 | Wt 213.0 lb

## 2013-10-03 DIAGNOSIS — C61 Malignant neoplasm of prostate: Secondary | ICD-10-CM

## 2013-10-03 DIAGNOSIS — R7309 Other abnormal glucose: Secondary | ICD-10-CM

## 2013-10-03 DIAGNOSIS — I10 Essential (primary) hypertension: Secondary | ICD-10-CM

## 2013-10-03 DIAGNOSIS — N289 Disorder of kidney and ureter, unspecified: Secondary | ICD-10-CM

## 2013-10-03 DIAGNOSIS — R739 Hyperglycemia, unspecified: Secondary | ICD-10-CM

## 2013-10-03 LAB — URINALYSIS, ROUTINE W REFLEX MICROSCOPIC
Ketones, ur: NEGATIVE
Specific Gravity, Urine: 1.015 (ref 1.000–1.030)
Total Protein, Urine: NEGATIVE
Urine Glucose: NEGATIVE
pH: 6 (ref 5.0–8.0)

## 2013-10-03 LAB — CBC WITH DIFFERENTIAL/PLATELET
Basophils Relative: 0.2 % (ref 0.0–3.0)
Eosinophils Relative: 1.7 % (ref 0.0–5.0)
HCT: 46.7 % (ref 39.0–52.0)
MCV: 93.9 fl (ref 78.0–100.0)
Monocytes Absolute: 0.5 10*3/uL (ref 0.1–1.0)
Neutrophils Relative %: 53.7 % (ref 43.0–77.0)
RBC: 4.97 Mil/uL (ref 4.22–5.81)
WBC: 6 10*3/uL (ref 4.5–10.5)

## 2013-10-03 LAB — BASIC METABOLIC PANEL
BUN: 20 mg/dL (ref 6–23)
Chloride: 102 mEq/L (ref 96–112)
GFR: 53.21 mL/min — ABNORMAL LOW (ref >60.00)
Glucose, Bld: 124 mg/dL — ABNORMAL HIGH (ref 70–99)
Potassium: 4.8 mEq/L (ref 3.5–5.1)

## 2013-10-03 LAB — HEPATIC FUNCTION PANEL
ALT: 33 U/L (ref 0–53)
AST: 28 U/L (ref 0–37)
Albumin: 4 g/dL (ref 3.5–5.2)
Alkaline Phosphatase: 41 U/L (ref 39–117)
Total Protein: 7 g/dL (ref 6.0–8.3)

## 2013-10-03 LAB — LIPID PANEL: VLDL: 29.6 mg/dL (ref 0.0–40.0)

## 2013-10-03 MED ORDER — FOLIC ACID 1 MG PO TABS: 1.0000 mg | ORAL_TABLET | Freq: Every day | ORAL | Status: DC

## 2013-10-03 NOTE — Progress Notes (Signed)
   Subjective:     HPI  The patient has been doing well overall without major physical or psychological issues going on lately, except for the prostate ca return - he had a prostate cryo in Dec 2013...  The patient needs to address  chronic hypertension that has been well controlled with medicines; to address chronic  hyperlipidemia controlled with medicines as well  BP Readings from Last 3 Encounters:  10/03/13 136/78  04/03/13 118/70  12/01/12 140/80   Wt Readings from Last 3 Encounters:  10/03/13 213 lb (96.616 kg)  04/03/13 211 lb (95.709 kg)  12/01/12 215 lb (97.523 kg)       Review of Systems  Constitutional: Negative for appetite change, fatigue and unexpected weight change.  HENT: Negative for congestion, nosebleeds, sneezing, sore throat and trouble swallowing.   Eyes: Negative for itching and visual disturbance.  Respiratory: Negative for cough.   Cardiovascular: Negative for chest pain, palpitations and leg swelling.  Gastrointestinal: Negative for nausea, diarrhea, blood in stool and abdominal distention.  Genitourinary: Negative for frequency and hematuria.  Musculoskeletal: Negative for back pain, gait problem, joint swelling and neck pain.  Skin: Negative for rash.  Neurological: Negative for dizziness, tremors, speech difficulty and weakness.  Psychiatric/Behavioral: Negative for sleep disturbance, dysphoric mood and agitation. The patient is not nervous/anxious.        Objective:   Physical Exam  Constitutional: He is oriented to person, place, and time. He appears well-developed.  HENT:  Mouth/Throat: Oropharynx is clear and moist.  Eyes: Conjunctivae are normal. Pupils are equal, round, and reactive to light.  Neck: Normal range of motion. No JVD present. No thyromegaly present.  Cardiovascular: Normal rate, regular rhythm, normal heart sounds and intact distal pulses.  Exam reveals no gallop and no friction rub.   No murmur heard. Pulmonary/Chest:  Effort normal and breath sounds normal. No respiratory distress. He has no wheezes. He has no rales. He exhibits no tenderness.  Abdominal: Soft. Bowel sounds are normal. He exhibits no distension and no mass. There is no tenderness. There is no rebound and no guarding.  Musculoskeletal: Normal range of motion. He exhibits no edema and no tenderness.  Lymphadenopathy:    He has no cervical adenopathy.  Neurological: He is alert and oriented to person, place, and time. He has normal reflexes. No cranial nerve deficit. He exhibits normal muscle tone. Coordination normal.  Skin: Skin is warm and dry. No rash noted.  Psychiatric: He has a normal mood and affect. His behavior is normal. Judgment and thought content normal.     Lab Results  Component Value Date   WBC 5.9 09/29/2012   HGB 16.3 12/01/2012   HCT 48.0 12/01/2012   PLT 182.0 09/29/2012   GLUCOSE 131* 12/01/2012   CHOL 126 09/29/2012   TRIG 109.0 09/29/2012   HDL 30.70* 09/29/2012   LDLCALC 74 09/29/2012   ALT 28 09/29/2012   AST 27 09/29/2012   NA 140 12/01/2012   K 4.5 12/01/2012   CL 101 12/01/2012   CREATININE 1.40* 12/01/2012   BUN 25* 12/01/2012   CO2 29 09/29/2012   TSH 1.79 09/29/2012   PSA 15.38* 09/29/2012   HGBA1C 6.6* 09/29/2012        Assessment & Plan:

## 2013-10-03 NOTE — Assessment & Plan Note (Signed)
Continue with current prescription therapy as reflected on the Med list.  

## 2013-10-03 NOTE — Assessment & Plan Note (Signed)
labs

## 2013-10-03 NOTE — Assessment & Plan Note (Signed)
Discussed - last psa ok

## 2013-10-30 ENCOUNTER — Other Ambulatory Visit (HOSPITAL_COMMUNITY): Payer: Self-pay | Admitting: Orthopaedic Surgery

## 2013-10-30 ENCOUNTER — Emergency Department (HOSPITAL_COMMUNITY): Payer: Medicare Other

## 2013-10-30 ENCOUNTER — Encounter (HOSPITAL_COMMUNITY): Payer: Self-pay | Admitting: Emergency Medicine

## 2013-10-30 ENCOUNTER — Observation Stay (HOSPITAL_COMMUNITY)
Admission: EM | Admit: 2013-10-30 | Discharge: 2013-11-02 | Disposition: A | Discharge status: Home / Self Care | Payer: Medicare Other | Attending: Orthopaedic Surgery | Admitting: Orthopaedic Surgery

## 2013-10-30 DIAGNOSIS — S838X9A Sprain of other specified parts of unspecified knee, initial encounter: Principal | ICD-10-CM | POA: Insufficient documentation

## 2013-10-30 DIAGNOSIS — I129 Hypertensive chronic kidney disease with stage 1 through stage 4 chronic kidney disease, or unspecified chronic kidney disease: Secondary | ICD-10-CM | POA: Diagnosis not present

## 2013-10-30 DIAGNOSIS — Y93H1 Activity, digging, shoveling and raking: Secondary | ICD-10-CM | POA: Insufficient documentation

## 2013-10-30 DIAGNOSIS — M25569 Pain in unspecified knee: Secondary | ICD-10-CM | POA: Diagnosis not present

## 2013-10-30 DIAGNOSIS — Z7982 Long term (current) use of aspirin: Secondary | ICD-10-CM | POA: Insufficient documentation

## 2013-10-30 DIAGNOSIS — I872 Venous insufficiency (chronic) (peripheral): Secondary | ICD-10-CM | POA: Insufficient documentation

## 2013-10-30 DIAGNOSIS — Z8546 Personal history of malignant neoplasm of prostate: Secondary | ICD-10-CM | POA: Diagnosis not present

## 2013-10-30 DIAGNOSIS — S76119A Strain of unspecified quadriceps muscle, fascia and tendon, initial encounter: Secondary | ICD-10-CM

## 2013-10-30 DIAGNOSIS — R52 Pain, unspecified: Secondary | ICD-10-CM | POA: Diagnosis not present

## 2013-10-30 DIAGNOSIS — S76112A Strain of left quadriceps muscle, fascia and tendon, initial encounter: Secondary | ICD-10-CM

## 2013-10-30 DIAGNOSIS — R9431 Abnormal electrocardiogram [ECG] [EKG]: Secondary | ICD-10-CM | POA: Diagnosis not present

## 2013-10-30 DIAGNOSIS — S8990XA Unspecified injury of unspecified lower leg, initial encounter: Secondary | ICD-10-CM | POA: Diagnosis not present

## 2013-10-30 DIAGNOSIS — W1789XA Other fall from one level to another, initial encounter: Secondary | ICD-10-CM | POA: Insufficient documentation

## 2013-10-30 DIAGNOSIS — S298XXA Other specified injuries of thorax, initial encounter: Secondary | ICD-10-CM | POA: Diagnosis not present

## 2013-10-30 DIAGNOSIS — Y92009 Unspecified place in unspecified non-institutional (private) residence as the place of occurrence of the external cause: Secondary | ICD-10-CM | POA: Insufficient documentation

## 2013-10-30 DIAGNOSIS — E785 Hyperlipidemia, unspecified: Secondary | ICD-10-CM | POA: Diagnosis not present

## 2013-10-30 DIAGNOSIS — N189 Chronic kidney disease, unspecified: Secondary | ICD-10-CM | POA: Diagnosis not present

## 2013-10-30 DIAGNOSIS — Z01818 Encounter for other preprocedural examination: Secondary | ICD-10-CM | POA: Diagnosis not present

## 2013-10-30 LAB — URINALYSIS, ROUTINE W REFLEX MICROSCOPIC
Bilirubin Urine: NEGATIVE
Glucose, UA: NEGATIVE mg/dL
Hgb urine dipstick: NEGATIVE
Ketones, ur: NEGATIVE mg/dL
Leukocytes, UA: NEGATIVE
Protein, ur: NEGATIVE mg/dL
Urobilinogen, UA: 1 mg/dL (ref 0.0–1.0)
pH: 6 (ref 5.0–8.0)

## 2013-10-30 LAB — COMPREHENSIVE METABOLIC PANEL
ALT: 28 U/L (ref 0–53)
AST: 25 U/L (ref 0–37)
Albumin: 4 g/dL (ref 3.5–5.2)
Alkaline Phosphatase: 45 U/L (ref 39–117)
BUN: 24 mg/dL — ABNORMAL HIGH (ref 6–23)
CO2: 26 mEq/L (ref 19–32)
Chloride: 103 mEq/L (ref 96–112)
Creatinine, Ser: 1.57 mg/dL — ABNORMAL HIGH (ref 0.50–1.35)
GFR calc non Af Amer: 41 mL/min — ABNORMAL LOW (ref >90)
Potassium: 4.3 mEq/L (ref 3.5–5.1)
Sodium: 140 mEq/L (ref 135–145)
Total Bilirubin: 0.3 mg/dL (ref 0.3–1.2)
Total Protein: 7 g/dL (ref 6.0–8.3)

## 2013-10-30 LAB — SURGICAL PCR SCREEN
MRSA, PCR: NEGATIVE
Staphylococcus aureus: NEGATIVE

## 2013-10-30 LAB — CBC WITH DIFFERENTIAL/PLATELET
Basophils Absolute: 0 10*3/uL (ref 0.0–0.1)
Basophils Relative: 0 % (ref 0–1)
HCT: 45.2 % (ref 39.0–52.0)
Hemoglobin: 15.7 g/dL (ref 13.0–17.0)
Lymphocytes Relative: 10 % — ABNORMAL LOW (ref 12–46)
MCHC: 34.7 g/dL (ref 30.0–36.0)
Monocytes Absolute: 0.7 10*3/uL (ref 0.1–1.0)
Monocytes Relative: 5 % (ref 3–12)
Neutro Abs: 10.5 10*3/uL — ABNORMAL HIGH (ref 1.7–7.7)
Neutrophils Relative %: 84 % — ABNORMAL HIGH (ref 43–77)
RBC: 4.86 MIL/uL (ref 4.22–5.81)
RDW: 12.3 % (ref 11.5–15.5)
WBC: 12.4 10*3/uL — ABNORMAL HIGH (ref 4.0–10.5)

## 2013-10-30 MED ORDER — ASPIRIN 325 MG PO TABS
325.0000 mg | ORAL_TABLET | Freq: Two times a day (BID) | ORAL | Status: DC
  Administered 2013-10-30 – 2013-10-31 (×2): 325 mg via ORAL
  Filled 2013-10-30 (×3): qty 1

## 2013-10-30 MED ORDER — ONDANSETRON HCL 4 MG/2ML IJ SOLN
4.0000 mg | Freq: Once | INTRAMUSCULAR | Status: AC
  Administered 2013-10-30: 4 mg via INTRAVENOUS
  Filled 2013-10-30: qty 2

## 2013-10-30 MED ORDER — OXYCODONE-ACETAMINOPHEN 5-325 MG PO TABS: 1.0000 | ORAL_TABLET | ORAL | Status: DC | PRN

## 2013-10-30 MED ORDER — CEFAZOLIN SODIUM-DEXTROSE 2-3 GM-% IV SOLR
2.0000 g | INTRAVENOUS | Status: DC
  Filled 2013-10-30: qty 50

## 2013-10-30 NOTE — ED Notes (Signed)
Pt sats dropped to 87% after pain medication, pt plaed on 2L [place]. sats 97%

## 2013-10-30 NOTE — Progress Notes (Signed)
Patient ID: Edward [name], male   DOB: May 26, 1933, 79 y.o.   MRN: [id number] Patient was told by ER that he could have surgery Wednesday. Knee immobilizer was never applied in ER and patient never got up.   I had told ER MD that I could do his surgery on Friday AM and he was posted for 7:30 AM.  Patient requests earlier surgery . I called Ortho tech and KI finally applied.  Will post for add on surgery at about 6 PM tomorrow.  He will get regular diet tonight, clear liquid breakfast then NPO for OR at about 6 PM.

## 2013-10-30 NOTE — ED Provider Notes (Signed)
CSN: [id number]     Arrival date & time 10/30/13  1216 History   First MD Initiated Contact with Patient 10/30/13 1223     Chief Complaint  Patient presents with  . Knee Pain   (Consider location/radiation/quality/duration/timing/severity/associated sxs/prior Treatment) Patient is a 79 y.o. male presenting with knee pain.  Knee Pain Location:  Knee Time since incident:  1 hour Injury: yes   Mechanism of injury: fall   Fall:    Fall occurred: off a curb while doing yard work. Knee location:  L knee Pain details:    Quality:  Dull   Radiates to:  Does not radiate   Severity:  Moderate   Onset quality:  Sudden   Timing:  Constant   Progression:  Improving Relieved by: fentanyl. Worsened by:  Bearing weight (movement) Ineffective treatments:  None tried Associated symptoms: decreased ROM   Associated symptoms: no tingling   Associated symptoms comment:  Nausea   Past Medical History  Diagnosis Date  . Hyperlipidemia   . Hypertension   . Elevated glucose   . Venous insufficiency of leg   . Drug-induced low platelet count     from NSAIDS  . Prostate cancer   . Complex renal cyst RIGHT--  MONITORED BY DR Isabel Caprice  . Chronic renal insufficiency     DR WEBB  . Nocturia    Past Surgical History  Procedure Laterality Date  . Cryoablation  12/01/2012    Procedure: CRYO ABLATION PROSTATE;  Surgeon: Lindaann Slough, MD;  Location: [facility];  Service: Urology;  Laterality: N/A;   Family History  Problem Relation Age of Onset  . Dementia Mother   . Tuberculosis Father   . Hypertension Father    History  Substance Use Topics  . Smoking status: Never Smoker   . Smokeless tobacco: Never Used  . Alcohol Use: No    Review of Systems  All other systems reviewed and are negative.    Allergies  Naproxen sodium  Home Medications   Current Outpatient Rx  Name  Route  Sig  Dispense  Refill  . aspirin 81 MG tablet   Oral   Take 81 mg by mouth daily.           Marland Kitchen BENICAR 40 MG tablet      TAKE 1 TABLET DAILY   90 tablet   3   . folic acid (FOLVITE) 1 MG tablet   Oral   Take 1 tablet (1 mg total) by mouth daily.   90 tablet   3   . Multiple Vitamins-Minerals (CENTRUM SILVER) tablet   Oral   Take 1 tablet by mouth daily.          . niacin (NIASPAN) 500 MG CR tablet      TAKE 1 TABLET DAILY   90 tablet   3   . triamterene-hydrochlorothiazide (DYAZIDE) 37.5-25 MG per capsule      TAKE 1 CAPSULE EVERY       MORNING   90 capsule   3   . Vitamin D, Ergocalciferol, (DRISDOL) 50000 UNITS CAPS capsule      TAKE 1 CAPSULE EVERY 30    DAYS   3 capsule   3    BP 129/65  Pulse 81  Temp(Src) 99.1 F (37.3 C) (Oral)  Resp 18  SpO2 95% Physical Exam  Nursing note and vitals reviewed. Constitutional: He is oriented to person, place, and time. He appears well-developed and well-nourished. No distress.  HENT:  Head: Normocephalic and atraumatic. Head is without raccoon's eyes and without Battle's sign.  Nose: Nose normal.  Eyes: Conjunctivae and EOM are normal. Pupils are equal, round, and reactive to light. No scleral icterus.  Neck: No spinous process tenderness and no muscular tenderness present.  Cardiovascular: Normal rate, regular rhythm, normal heart sounds and intact distal pulses.   No murmur heard. Pulmonary/Chest: Effort normal and breath sounds normal. He has no rales. He exhibits no tenderness.  Abdominal: Soft. There is no tenderness. There is no rebound and no guarding.  Musculoskeletal: He exhibits no edema.       Left knee: He exhibits deformity. He exhibits normal range of motion (decreased active ROM.). Tenderness found.       Thoracic back: He exhibits no tenderness and no bony tenderness.       Lumbar back: He exhibits no tenderness and no bony tenderness.  No evidence of trauma to extremities, except as noted.  2+ distal pulses.    Neurological: He is alert and oriented to person, place, and time.   Skin: Skin is warm and dry. No rash noted.  Psychiatric: He has a normal mood and affect.    ED Course  Procedures (including critical care time) Labs Review Labs Reviewed - No data to display Imaging Review Dg Chest 2 View  10/30/2013   CLINICAL DATA:  Fall, preop  EXAM: CHEST  2 VIEW  COMPARISON:  None.  FINDINGS: Lungs are clear.  No pleural effusion or pneumothorax.  The heart is normal in size.  Mild elevation of the left hemidiaphragm.  Mild degenerative changes of the visualized thoracolumbar spine.  IMPRESSION: No evidence of acute cardiopulmonary disease.   Electronically Signed   By: Charline Bills M.D.   On: 10/30/2013 14:09   Dg Knee Left Port  10/30/2013   CLINICAL DATA:  Left knee pain status post fall.  EXAM: PORTABLE LEFT KNEE - 1-2 VIEW  COMPARISON:  October 12, 2012.  FINDINGS: An AP and cross-table lateral view of the left knee reveal the bones to be adequately mineralized. There is a new bony density along the anterior aspect of the distal femoral metaphysis seen on the lateral film. It is not clearly evident on the frontal film. This may reflect avulsion of an area of calcification previously demonstrated along the anterior superior margin of the patella which is not evident today. There is no evidence of a femoral condylar fracture nor of a tibial plateau fracture. The proximal fibula appears intact. There is no definite joint effusion though there may be mild soft tissue swelling in the suprapatellar region.  IMPRESSION: 1. Bony density previously demonstrated associated with the superior margin of the patella is not evident today. However, more proximally there is calcification demonstrated along the anterior aspect of the distal femoral metaphysis on the lateral film. There is an associated overlying soft tissue swelling may reflect quadriceps tendon injury with avulsion of a area of calcification at the tendinous insertion along the superior margin of the patella. 2.  There is no evidence of a femoral condylar fracture nor of a tibial plateau or proximal fibular fracture or dislocation.   Electronically Signed   By: David  [place]   On: 10/30/2013 12:54  All radiology studies independently viewed by me.     EKG Interpretation     Ventricular Rate:  70 PR Interval:  239 QRS Duration: 96 QT Interval:  386 QTC Calculation: 416 R Axis:   29 Text Interpretation:  Sinus rhythm  Prolonged PR interval Borderline low voltage, extremity leads Abnormal R-wave progression, early transition Artifact No significant change was found            MDM   1. Quadriceps tendon rupture, left, initial encounter    79 yo male with knee injury 1-2 hours PTA.  He has an apparent quadriceps tendon rupture.  Full ROM of knee without significant pain, but unable to actively extend knee.  He has no injury or pain anywhere else.    Bloodwork obtained for pre-op purposes.    Dr. Ophelia Charter with Orthopedics was consulted and will admit for surgical repair.    Candyce Churn, MD 10/30/13 [phone]

## 2013-10-30 NOTE — H&P (Signed)
Edward [name] is an 79 y.o. male.   Chief Complaint:  Was leaf blowing and stepped off a 2 ft. Cement slab and when left leg landed his left knee buckled and he fell to the ground.  HPI: past Hx of some aching pain after activity above the patella .   Past Medical History  Diagnosis Date  . Hyperlipidemia   . Hypertension   . Elevated glucose   . Venous insufficiency of leg   . Drug-induced low platelet count     from NSAIDS  . Prostate cancer   . Complex renal cyst RIGHT--  MONITORED BY DR Isabel Caprice  . Chronic renal insufficiency     DR WEBB  . Nocturia     Past Surgical History  Procedure Laterality Date  . Cryoablation  12/01/2012    Procedure: CRYO ABLATION PROSTATE;  Surgeon: Lindaann Slough, MD;  Location: [facility];  Service: Urology;  Laterality: N/A;    Family History  Problem Relation Age of Onset  . Dementia Mother   . Tuberculosis Father   . Hypertension Father    Social History:  reports that he has never smoked. He has never used smokeless tobacco. He reports that he does not drink alcohol or use illicit drugs.  Allergies:  Allergies  Allergen Reactions  . Naproxen Sodium     REACTION: low plt    Medications Prior to Admission  Medication Sig Dispense Refill  . aspirin 81 MG chewable tablet Chew 81 mg by mouth daily.      Marland Kitchen aspirin 81 MG tablet Take 81 mg by mouth daily.       . folic acid (FOLVITE) 1 MG tablet Take 1 tablet (1 mg total) by mouth daily.  90 tablet  3  . Multiple Vitamins-Minerals (CENTRUM SILVER) tablet Take 1 tablet by mouth daily.       . niacin (NIASPAN) 500 MG CR tablet Take 500 mg by mouth daily.      Marland Kitchen olmesartan (BENICAR) 40 MG tablet Take 40 mg by mouth daily.      Marland Kitchen triamterene-hydrochlorothiazide (DYAZIDE) 37.5-25 MG per capsule Take 1 capsule by mouth every morning.      . Vitamin D, Ergocalciferol, (DRISDOL) 50000 UNITS CAPS capsule Take 50,000 Units by mouth every 30 (thirty) days. First of the month         Results for orders placed during the hospital encounter of 10/30/13 (from the past 48 hour(s))  CBC WITH DIFFERENTIAL     Status: Abnormal   Collection Time    10/30/13 12:36 PM      Result Value Range   WBC 12.4 (*) 4.0 - 10.5 K/uL   RBC 4.86  4.22 - 5.81 MIL/uL   Hemoglobin 15.7  13.0 - 17.0 g/dL   HCT 11.9  14.7 - 82.9 %   MCV 93.0  78.0 - 100.0 fL   MCH 32.3  26.0 - 34.0 pg   MCHC 34.7  30.0 - 36.0 g/dL   RDW 56.2  13.0 - 86.5 %   Platelets 152  150 - 400 K/uL   Neutrophils Relative % 84 (*) 43 - 77 %   Neutro Abs 10.5 (*) 1.7 - 7.7 K/uL   Lymphocytes Relative 10 (*) 12 - 46 %   Lymphs Abs 1.3  0.7 - 4.0 K/uL   Monocytes Relative 5  3 - 12 %   Monocytes Absolute 0.7  0.1 - 1.0 K/uL   Eosinophils Relative 0  0 -  5 %   Eosinophils Absolute 0.0  0.0 - 0.7 K/uL   Basophils Relative 0  0 - 1 %   Basophils Absolute 0.0  0.0 - 0.1 K/uL  COMPREHENSIVE METABOLIC PANEL     Status: Abnormal   Collection Time    10/30/13  1:29 PM      Result Value Range   Sodium 140  135 - 145 mEq/L   Potassium 4.3  3.5 - 5.1 mEq/L   Chloride 103  96 - 112 mEq/L   CO2 26  19 - 32 mEq/L   Glucose, Bld 166 (*) 70 - 99 mg/dL   BUN 24 (*) 6 - 23 mg/dL   Creatinine, Ser 1.61 (*) 0.50 - 1.35 mg/dL   Calcium 8.9  8.4 - 09.6 mg/dL   Total Protein 7.0  6.0 - 8.3 g/dL   Albumin 4.0  3.5 - 5.2 g/dL   AST 25  0 - 37 U/L   ALT 28  0 - 53 U/L   Alkaline Phosphatase 45  39 - 117 U/L   Total Bilirubin 0.3  0.3 - 1.2 mg/dL   GFR calc non Af Amer 41 (*) >90 mL/min   GFR calc Af Amer 47 (*) >90 mL/min   Comment: (NOTE)     The eGFR has been calculated using the CKD EPI equation.     This calculation has not been validated in all clinical situations.     eGFR's persistently <90 mL/min signify possible Chronic Kidney     Disease.   Dg Chest 2 View  10/30/2013   CLINICAL DATA:  Fall, preop  EXAM: CHEST  2 VIEW  COMPARISON:  None.  FINDINGS: Lungs are clear.  No pleural effusion or pneumothorax.  The heart  is normal in size.  Mild elevation of the left hemidiaphragm.  Mild degenerative changes of the visualized thoracolumbar spine.  IMPRESSION: No evidence of acute cardiopulmonary disease.   Electronically Signed   By: Charline Bills M.D.   On: 10/30/2013 14:09   Dg Knee Left Port  10/30/2013   CLINICAL DATA:  Left knee pain status post fall.  EXAM: PORTABLE LEFT KNEE - 1-2 VIEW  COMPARISON:  October 12, 2012.  FINDINGS: An AP and cross-table lateral view of the left knee reveal the bones to be adequately mineralized. There is a new bony density along the anterior aspect of the distal femoral metaphysis seen on the lateral film. It is not clearly evident on the frontal film. This may reflect avulsion of an area of calcification previously demonstrated along the anterior superior margin of the patella which is not evident today. There is no evidence of a femoral condylar fracture nor of a tibial plateau fracture. The proximal fibula appears intact. There is no definite joint effusion though there may be mild soft tissue swelling in the suprapatellar region.  IMPRESSION: 1. Bony density previously demonstrated associated with the superior margin of the patella is not evident today. However, more proximally there is calcification demonstrated along the anterior aspect of the distal femoral metaphysis on the lateral film. There is an associated overlying soft tissue swelling may reflect quadriceps tendon injury with avulsion of a area of calcification at the tendinous insertion along the superior margin of the patella. 2. There is no evidence of a femoral condylar fracture nor of a tibial plateau or proximal fibular fracture or dislocation.   Electronically Signed   By: David  [place]   On: 10/30/2013 12:54    Review of  Systems  Constitutional: Negative for fever, chills, weight loss and diaphoresis.  HENT: Negative for ear pain and tinnitus.   Eyes: Negative for blurred vision.  Respiratory: Negative for  cough and sputum production.   Cardiovascular: Negative for chest pain, palpitations and orthopnea.  Gastrointestinal: Negative for heartburn and vomiting.  Genitourinary:       Prostate cancer with prostate ablasion, cryotherapy.   Skin: Negative for rash.  Neurological: Negative for dizziness, tingling and headaches.  Psychiatric/Behavioral: Negative for depression and suicidal ideas.    Blood pressure 134/76, pulse 64, temperature 98.4 F (36.9 C), temperature source Oral, resp. rate 18, SpO2 98.00%. Physical Exam  Constitutional: He is oriented to person, place, and time. He appears well-developed and well-nourished.  HENT:  Head: Normocephalic and atraumatic.  Eyes: Pupils are equal, round, and reactive to light.  Neck: Normal range of motion.  Cardiovascular: Normal rate and regular rhythm.   Respiratory: Effort normal and breath sounds normal.  GI: Soft. Bowel sounds are normal.  Musculoskeletal:  Left quad tendon rupture with patella baja and palpable defect in quad tendon. Unable to SLR   Neurological: He is alert and oriented to person, place, and time.  Skin: Skin is warm and dry. No rash noted.  Psychiatric: He has a normal mood and affect. His behavior is normal.     Assessment/Plan Left quad tendon rupture , acute. Plan repair.   Alecsander Hattabaugh C 10/30/2013, 8:45 PM

## 2013-10-30 NOTE — Progress Notes (Signed)
Orthopedic Tech Progress Note Patient Details:  Edward [name] Jul 14, 1933 [id number]  Ortho Devices Type of Ortho Device: Knee Immobilizer Ortho Device/Splint Location: LLE Ortho Device/Splint Interventions: Ordered;Application   Jennye Moccasin 10/30/2013, 9:01 PM

## 2013-10-30 NOTE — ED Notes (Signed)
Pt to department via EMS- pt reports that he was walking a stepped off a step on his left side and his knee went under him. Pt with deformity to the left knee. Pt reports some dizziness at this time. Pulses palpated. Bp-128/70 Hr-80 20g right hand, of fentanyl given by EMS.

## 2013-10-30 NOTE — ED Notes (Signed)
Dr. Ignacia Felling at bedside.

## 2013-10-31 ENCOUNTER — Encounter (HOSPITAL_COMMUNITY): Payer: Medicare Other | Admitting: Anesthesiology

## 2013-10-31 ENCOUNTER — Inpatient Hospital Stay (HOSPITAL_COMMUNITY): Admission: RE | Admit: 2013-10-31 | Payer: Medicare Other | Source: Ambulatory Visit | Admitting: Orthopaedic Surgery

## 2013-10-31 ENCOUNTER — Encounter (HOSPITAL_COMMUNITY): Payer: Self-pay | Admitting: General Practice

## 2013-10-31 ENCOUNTER — Encounter (HOSPITAL_COMMUNITY)
Admission: EM | Disposition: A | Discharge status: Home / Self Care | Payer: Self-pay | Source: Home / Self Care | Attending: Emergency Medicine

## 2013-10-31 ENCOUNTER — Observation Stay (HOSPITAL_COMMUNITY): Payer: Medicare Other | Admitting: Anesthesiology

## 2013-10-31 DIAGNOSIS — I129 Hypertensive chronic kidney disease with stage 1 through stage 4 chronic kidney disease, or unspecified chronic kidney disease: Secondary | ICD-10-CM | POA: Diagnosis not present

## 2013-10-31 DIAGNOSIS — S838X9A Sprain of other specified parts of unspecified knee, initial encounter: Secondary | ICD-10-CM | POA: Diagnosis not present

## 2013-10-31 DIAGNOSIS — E785 Hyperlipidemia, unspecified: Secondary | ICD-10-CM | POA: Diagnosis not present

## 2013-10-31 DIAGNOSIS — N189 Chronic kidney disease, unspecified: Secondary | ICD-10-CM | POA: Diagnosis not present

## 2013-10-31 DIAGNOSIS — M66259 Spontaneous rupture of extensor tendons, unspecified thigh: Secondary | ICD-10-CM | POA: Diagnosis not present

## 2013-10-31 HISTORY — PX: QUADRICEPS TENDON REPAIR: SHX756

## 2013-10-31 SURGERY — REPAIR, TENDON, QUADRICEPS
Anesthesia: General | Site: Knee | Laterality: Left | Wound class: Clean

## 2013-10-31 MED ORDER — HYDROMORPHONE HCL PF 1 MG/ML IJ SOLN
0.2500 mg | INTRAMUSCULAR | Status: DC | PRN
  Administered 2013-10-31 (×3): 0.5 mg via INTRAVENOUS

## 2013-10-31 MED ORDER — LACTATED RINGERS IV SOLN
INTRAVENOUS | Status: DC
  Administered 2013-10-31: 18:00:00 via INTRAVENOUS

## 2013-10-31 MED ORDER — METHOCARBAMOL 500 MG PO TABS: 500.0000 mg | ORAL_TABLET | Freq: Four times a day (QID) | ORAL | Status: DC | PRN

## 2013-10-31 MED ORDER — LACTATED RINGERS IV SOLN
INTRAVENOUS | Status: DC | PRN
  Administered 2013-10-31 (×2): via INTRAVENOUS

## 2013-10-31 MED ORDER — ONDANSETRON HCL 4 MG/2ML IJ SOLN: 4.0000 mg | Freq: Four times a day (QID) | INTRAMUSCULAR | Status: DC | PRN

## 2013-10-31 MED ORDER — OXYCODONE-ACETAMINOPHEN 5-325 MG PO TABS: 1.0000 | ORAL_TABLET | ORAL | Status: DC | PRN

## 2013-10-31 MED ORDER — ONDANSETRON HCL 4 MG PO TABS: 4.0000 mg | ORAL_TABLET | Freq: Four times a day (QID) | ORAL | Status: DC | PRN

## 2013-10-31 MED ORDER — HYDROMORPHONE HCL PF 1 MG/ML IJ SOLN
INTRAMUSCULAR | Status: AC
  Administered 2013-10-31: 0.5 mg via INTRAVENOUS
  Filled 2013-10-31: qty 1

## 2013-10-31 MED ORDER — BUPIVACAINE HCL (PF) 0.5 % IJ SOLN
INTRAMUSCULAR | Status: DC | PRN
  Administered 2013-10-31: 25 mL

## 2013-10-31 MED ORDER — EPHEDRINE SULFATE 50 MG/ML IJ SOLN
INTRAMUSCULAR | Status: DC | PRN
  Administered 2013-10-31: 5 mg via INTRAVENOUS
  Administered 2013-10-31: 10 mg via INTRAVENOUS

## 2013-10-31 MED ORDER — KCL IN DEXTROSE-NACL 20-5-0.45 MEQ/L-%-% IV SOLN
INTRAVENOUS | Status: DC
  Filled 2013-10-31 (×4): qty 1000

## 2013-10-31 MED ORDER — BISACODYL 10 MG RE SUPP: 10.0000 mg | Freq: Every day | RECTAL | Status: DC | PRN

## 2013-10-31 MED ORDER — METOCLOPRAMIDE HCL 5 MG/ML IJ SOLN: 5.0000 mg | Freq: Three times a day (TID) | INTRAMUSCULAR | Status: DC | PRN

## 2013-10-31 MED ORDER — DOCUSATE SODIUM 100 MG PO CAPS: 100.0000 mg | ORAL_CAPSULE | Freq: Two times a day (BID) | ORAL | Status: DC

## 2013-10-31 MED ORDER — CEFAZOLIN SODIUM-DEXTROSE 2-3 GM-% IV SOLR
INTRAVENOUS | Status: AC
  Administered 2013-10-31: 2 g via INTRAVENOUS
  Filled 2013-10-31: qty 50

## 2013-10-31 MED ORDER — NIACIN ER (ANTIHYPERLIPIDEMIC) 500 MG PO TBCR
500.0000 mg | EXTENDED_RELEASE_TABLET | Freq: Every day | ORAL | Status: DC
  Administered 2013-11-01: 500 mg via ORAL
  Filled 2013-10-31 (×2): qty 1

## 2013-10-31 MED ORDER — ONDANSETRON HCL 4 MG/2ML IJ SOLN: 4.0000 mg | Freq: Once | INTRAMUSCULAR | Status: DC | PRN

## 2013-10-31 MED ORDER — CEFAZOLIN SODIUM 1-5 GM-% IV SOLN
1.0000 g | Freq: Four times a day (QID) | INTRAVENOUS | Status: AC
  Administered 2013-11-01 (×3): 1 g via INTRAVENOUS
  Filled 2013-10-31 (×3): qty 50

## 2013-10-31 MED ORDER — FENTANYL CITRATE 0.05 MG/ML IJ SOLN
INTRAMUSCULAR | Status: DC | PRN
  Administered 2013-10-31 (×4): 50 ug via INTRAVENOUS

## 2013-10-31 MED ORDER — ONDANSETRON HCL 4 MG/2ML IJ SOLN
INTRAMUSCULAR | Status: DC | PRN
  Administered 2013-10-31: 4 mg via INTRAVENOUS

## 2013-10-31 MED ORDER — MORPHINE SULFATE 2 MG/ML IJ SOLN: 1.0000 mg | INTRAMUSCULAR | Status: DC | PRN

## 2013-10-31 MED ORDER — NEOSTIGMINE METHYLSULFATE 1 MG/ML IJ SOLN
INTRAMUSCULAR | Status: DC | PRN
  Administered 2013-10-31: 4 mg via INTRAVENOUS

## 2013-10-31 MED ORDER — SENNOSIDES-DOCUSATE SODIUM 8.6-50 MG PO TABS: 1.0000 | ORAL_TABLET | Freq: Every evening | ORAL | Status: DC | PRN

## 2013-10-31 MED ORDER — MIDAZOLAM HCL 5 MG/5ML IJ SOLN
INTRAMUSCULAR | Status: DC | PRN
  Administered 2013-10-31: 2 mg via INTRAVENOUS

## 2013-10-31 MED ORDER — POLYETHYLENE GLYCOL 3350 17 G PO PACK: 17.0000 g | PACK | Freq: Every day | ORAL | Status: DC | PRN

## 2013-10-31 MED ORDER — ZOLPIDEM TARTRATE 5 MG PO TABS: 5.0000 mg | ORAL_TABLET | Freq: Every evening | ORAL | Status: DC | PRN

## 2013-10-31 MED ORDER — SODIUM CHLORIDE 0.9 % IV SOLN: INTRAVENOUS | Status: DC

## 2013-10-31 MED ORDER — ROCURONIUM BROMIDE 100 MG/10ML IV SOLN
INTRAVENOUS | Status: DC | PRN
  Administered 2013-10-31: 30 mg via INTRAVENOUS

## 2013-10-31 MED ORDER — IRBESARTAN 300 MG PO TABS
300.0000 mg | ORAL_TABLET | Freq: Every day | ORAL | Status: DC
  Administered 2013-11-01 – 2013-11-02 (×2): 300 mg via ORAL
  Filled 2013-10-31 (×3): qty 1

## 2013-10-31 MED ORDER — METHOCARBAMOL 100 MG/ML IJ SOLN: 500.0000 mg | Freq: Four times a day (QID) | INTRAVENOUS | Status: DC | PRN

## 2013-10-31 MED ORDER — LIDOCAINE HCL (CARDIAC) 20 MG/ML IV SOLN
INTRAVENOUS | Status: DC | PRN
  Administered 2013-10-31: 50 mg via INTRAVENOUS

## 2013-10-31 MED ORDER — FOLIC ACID 1 MG PO TABS
1.0000 mg | ORAL_TABLET | Freq: Every day | ORAL | Status: DC
  Administered 2013-11-01 – 2013-11-02 (×2): 1 mg via ORAL
  Filled 2013-10-31 (×3): qty 1

## 2013-10-31 MED ORDER — FLEET ENEMA 7-19 GM/118ML RE ENEM: 1.0000 | ENEMA | Freq: Once | RECTAL | Status: AC | PRN

## 2013-10-31 MED ORDER — DOCUSATE SODIUM 100 MG PO CAPS
100.0000 mg | ORAL_CAPSULE | Freq: Two times a day (BID) | ORAL | Status: DC
  Administered 2013-11-01 – 2013-11-02 (×3): 100 mg via ORAL
  Filled 2013-10-31 (×5): qty 1

## 2013-10-31 MED ORDER — METHOCARBAMOL 100 MG/ML IJ SOLN
500.0000 mg | Freq: Four times a day (QID) | INTRAVENOUS | Status: DC | PRN
  Filled 2013-10-31: qty 5

## 2013-10-31 MED ORDER — PROPOFOL 10 MG/ML IV BOLUS
INTRAVENOUS | Status: DC | PRN
  Administered 2013-10-31: 200 mg via INTRAVENOUS

## 2013-10-31 MED ORDER — METOCLOPRAMIDE HCL 10 MG PO TABS: 5.0000 mg | ORAL_TABLET | Freq: Three times a day (TID) | ORAL | Status: DC | PRN

## 2013-10-31 MED ORDER — TRIAMTERENE-HCTZ 37.5-25 MG PO CAPS
1.0000 | ORAL_CAPSULE | Freq: Every morning | ORAL | Status: DC
  Administered 2013-11-01 – 2013-11-02 (×2): 1 via ORAL
  Filled 2013-10-31 (×2): qty 1

## 2013-10-31 MED ORDER — ASPIRIN 325 MG PO TABS: 325.0000 mg | ORAL_TABLET | Freq: Two times a day (BID) | ORAL | Status: DC

## 2013-10-31 MED ORDER — GLYCOPYRROLATE 0.2 MG/ML IJ SOLN
INTRAMUSCULAR | Status: DC | PRN
  Administered 2013-10-31: 0.6 mg via INTRAVENOUS

## 2013-10-31 MED ORDER — DIPHENHYDRAMINE HCL 12.5 MG/5ML PO ELIX: 12.5000 mg | ORAL_SOLUTION | ORAL | Status: DC | PRN

## 2013-10-31 MED ORDER — 0.9 % SODIUM CHLORIDE (POUR BTL) OPTIME
TOPICAL | Status: DC | PRN
  Administered 2013-10-31: 1000 mL

## 2013-10-31 MED ORDER — HYDROCODONE-ACETAMINOPHEN 5-325 MG PO TABS
1.0000 | ORAL_TABLET | ORAL | Status: DC | PRN
  Administered 2013-11-01 – 2013-11-02 (×3): 2 via ORAL
  Filled 2013-10-31 (×3): qty 2

## 2013-10-31 MED ORDER — CEFAZOLIN SODIUM-DEXTROSE 2-3 GM-% IV SOLR
2.0000 g | Freq: Once | INTRAVENOUS | Status: DC
  Filled 2013-10-31 (×2): qty 50

## 2013-10-31 MED ORDER — HYDROMORPHONE HCL PF 1 MG/ML IJ SOLN: 0.5000 mg | INTRAMUSCULAR | Status: DC | PRN

## 2013-10-31 SURGICAL SUPPLY — 58 items
BANDAGE ELASTIC 4 VELCRO ST LF (GAUZE/BANDAGES/DRESSINGS) ×2 IMPLANT
BANDAGE ELASTIC 6 VELCRO ST LF (GAUZE/BANDAGES/DRESSINGS) ×2 IMPLANT
BANDAGE ESMARK 6X9 LF (GAUZE/BANDAGES/DRESSINGS) ×1 IMPLANT
BENZOIN TINCTURE PRP APPL 2/3 (GAUZE/BANDAGES/DRESSINGS) ×2 IMPLANT
BLADE SURG ROTATE 9660 (MISCELLANEOUS) ×2 IMPLANT
BNDG COHESIVE 6X5 TAN STRL LF (GAUZE/BANDAGES/DRESSINGS) ×2 IMPLANT
BNDG ESMARK 6X9 LF (GAUZE/BANDAGES/DRESSINGS) ×2
CANISTER SUCTION 2500CC (MISCELLANEOUS) ×2 IMPLANT
CLOTH BEACON ORANGE TIMEOUT ST (SAFETY) IMPLANT
COVER SURGICAL LIGHT HANDLE (MISCELLANEOUS) ×2 IMPLANT
CUFF TOURNIQUET SINGLE 34IN LL (TOURNIQUET CUFF) ×2 IMPLANT
CUFF TOURNIQUET SINGLE 44IN (TOURNIQUET CUFF) IMPLANT
DRSG ADAPTIC 3X8 NADH LF (GAUZE/BANDAGES/DRESSINGS) ×2 IMPLANT
DRSG EMULSION OIL 3X3 NADH (GAUZE/BANDAGES/DRESSINGS) ×2 IMPLANT
DRSG PAD ABDOMINAL 8X10 ST (GAUZE/BANDAGES/DRESSINGS) ×2 IMPLANT
ELECT REM PT RETURN 9FT ADLT (ELECTROSURGICAL) ×2
ELECTRODE REM PT RTRN 9FT ADLT (ELECTROSURGICAL) ×1 IMPLANT
GAUZE XEROFORM 5X9 LF (GAUZE/BANDAGES/DRESSINGS) ×2 IMPLANT
GLOVE BIOGEL PI IND STRL 7.5 (GLOVE) ×1 IMPLANT
GLOVE BIOGEL PI IND STRL 8 (GLOVE) ×1 IMPLANT
GLOVE BIOGEL PI INDICATOR 7.5 (GLOVE) ×1
GLOVE BIOGEL PI INDICATOR 8 (GLOVE) ×1
GLOVE ECLIPSE 7.0 STRL STRAW (GLOVE) IMPLANT
GLOVE ORTHO TXT STRL SZ7.5 (GLOVE) IMPLANT
GOWN PREVENTION PLUS LG XLONG (DISPOSABLE) IMPLANT
GOWN PREVENTION PLUS XLARGE (GOWN DISPOSABLE) ×4 IMPLANT
KIT BASIN OR (CUSTOM PROCEDURE TRAY) ×2 IMPLANT
KIT ROOM TURNOVER OR (KITS) ×2 IMPLANT
MANIFOLD NEPTUNE II (INSTRUMENTS) IMPLANT
NEEDLE 22X1 1/2 (OR ONLY) (NEEDLE) IMPLANT
NEEDLE KEITH (NEEDLE) ×6 IMPLANT
NS IRRIG 1000ML POUR BTL (IV SOLUTION) ×2 IMPLANT
PACK ORTHO EXTREMITY (CUSTOM PROCEDURE TRAY) ×2 IMPLANT
PAD ARMBOARD 7.5X6 YLW CONV (MISCELLANEOUS) ×4 IMPLANT
PAD CAST 4YDX4 CTTN HI CHSV (CAST SUPPLIES) ×1 IMPLANT
PADDING CAST COTTON 4X4 STRL (CAST SUPPLIES) ×2
PADDING CAST COTTON 6X4 STRL (CAST SUPPLIES) ×2 IMPLANT
PASSER SUT SWANSON 36MM LOOP (INSTRUMENTS) ×2 IMPLANT
SPONGE GAUZE 4X4 12PLY (GAUZE/BANDAGES/DRESSINGS) ×2 IMPLANT
SPONGE LAP 4X18 X RAY DECT (DISPOSABLE) ×4 IMPLANT
STAPLER VISISTAT 35W (STAPLE) ×2 IMPLANT
STOCKINETTE IMPERVIOUS LG (DRAPES) ×2 IMPLANT
STRIP CLOSURE SKIN 1/2X4 (GAUZE/BANDAGES/DRESSINGS) ×2 IMPLANT
SUCTION FRAZIER TIP 10 FR DISP (SUCTIONS) ×2 IMPLANT
SUT ETHIBOND NAB CT1 #1 30IN (SUTURE) ×2 IMPLANT
SUT FIBERWIRE #2 38 T-5 BLUE (SUTURE) ×2
SUT VIC AB 0 CT1 27 (SUTURE) ×2
SUT VIC AB 0 CT1 27XBRD ANBCTR (SUTURE) ×1 IMPLANT
SUT VIC AB 2-0 CT1 27 (SUTURE) ×2
SUT VIC AB 2-0 CT1 TAPERPNT 27 (SUTURE) ×2 IMPLANT
SUTURE FIBERWR #2 38 T-5 BLUE (SUTURE) ×1 IMPLANT
SYR CONTROL 10ML LL (SYRINGE) IMPLANT
TOWEL OR 17X24 6PK STRL BLUE (TOWEL DISPOSABLE) ×2 IMPLANT
TOWEL OR 17X26 10 PK STRL BLUE (TOWEL DISPOSABLE) ×2 IMPLANT
TUBE CONNECTING 12X1/4 (SUCTIONS) ×2 IMPLANT
UNDERPAD 30X30 INCONTINENT (UNDERPADS AND DIAPERS) ×2 IMPLANT
WATER STERILE IRR 1000ML POUR (IV SOLUTION) IMPLANT
YANKAUER SUCT BULB TIP NO VENT (SUCTIONS) ×2 IMPLANT

## 2013-10-31 NOTE — Anesthesia Procedure Notes (Signed)
Procedure Name: Intubation Date/Time: 10/31/2013 6:31 PM Performed by: Brien Mates D Pre-anesthesia Checklist: Patient identified, Emergency Drugs available, Suction available, Patient being monitored and Timeout performed Patient Re-evaluated:Patient Re-evaluated prior to inductionOxygen Delivery Method: Circle system utilized Preoxygenation: Pre-oxygenation with 100% oxygen Intubation Type: IV induction Ventilation: Mask ventilation without difficulty Laryngoscope Size: Miller and 2 Grade View: Grade I Tube type: Oral Tube size: 7.5 mm Number of attempts: 1 Airway Equipment and Method: Stylet Placement Confirmation: ETT inserted through vocal cords under direct vision,  positive ETCO2 and breath sounds checked- equal and bilateral Secured at: 23 cm Tube secured with: Tape Dental Injury: Teeth and Oropharynx as per pre-operative assessment

## 2013-10-31 NOTE — Anesthesia Postprocedure Evaluation (Signed)
  Anesthesia Post-op Note  Patient: Edward [name]  Procedure(s) Performed: Procedure(s) with comments: Left Quadriceps Tendon Repair (Left) - Left Quadriceps Tendon Repair  Patient Location: PACU  Anesthesia Type:General  Level of Consciousness: awake, alert  and oriented  Airway and Oxygen Therapy: Patient Spontanous Breathing and Patient connected to nasal cannula oxygen  Post-op Pain: mild  Post-op Assessment: Post-op Vital signs reviewed  Post-op Vital Signs: Reviewed  Complications: No apparent anesthesia complications

## 2013-10-31 NOTE — Progress Notes (Signed)
Patient ID: Edward [name], male   DOB: 06-09-1933, 79 y.o.   MRN: [id number] To OR late today for repair of quad tendon Currently comfortable in KI NPO Possible discharge tomorrow after PT

## 2013-10-31 NOTE — Anesthesia Preprocedure Evaluation (Addendum)
Anesthesia Evaluation  Patient identified by MRN, date of birth, ID band Patient awake    Reviewed: Allergy & Precautions, H&P , NPO status , Patient's Chart, lab work & pertinent test results, reviewed documented beta blocker date and time   Airway Mallampati: II TM Distance: >3 FB Neck ROM: Full    Dental  (+) Teeth Intact and Dental Advisory Given   Pulmonary          Cardiovascular hypertension, + Peripheral Vascular Disease     Neuro/Psych    GI/Hepatic   Endo/Other    Renal/GU Renal InsufficiencyRenal diseaseHistory of renal cyst     Musculoskeletal   Abdominal   Peds  Hematology   Anesthesia Other Findings   Reproductive/Obstetrics                         Anesthesia Physical Anesthesia Plan  ASA: III  Anesthesia Plan: General   Post-op Pain Management:    Induction: Intravenous  Airway Management Planned: LMA and Oral ETT  Additional Equipment:   Intra-op Plan:   Post-operative Plan: Extubation in OR  Informed Consent: I have reviewed the patients History and Physical, chart, labs and discussed the procedure including the risks, benefits and alternatives for the proposed anesthesia with the patient or authorized representative who has indicated his/her understanding and acceptance.   Dental advisory given  Plan Discussed with: CRNA, Anesthesiologist and Surgeon  Anesthesia Plan Comments:        Anesthesia Quick Evaluation

## 2013-10-31 NOTE — Progress Notes (Signed)
UR COMPLETED  

## 2013-10-31 NOTE — Transfer of Care (Signed)
Immediate Anesthesia Transfer of Care Note  Patient: Edward [name]  Procedure(s) Performed: Procedure(s) with comments: Left Quadriceps Tendon Repair (Left) - Left Quadriceps Tendon Repair  Patient Location: PACU  Anesthesia Type:General  Level of Consciousness: awake  Airway & Oxygen Therapy: Patient Spontanous Breathing and Patient connected to nasal cannula oxygen  Post-op Assessment: Report given to PACU RN, Post -op Vital signs reviewed and stable and Patient moving all extremities  Post vital signs: Reviewed and stable  Complications: No apparent anesthesia complications

## 2013-10-31 NOTE — Brief Op Note (Signed)
10/30/2013 - 10/31/2013  7:42 PM  PATIENT:  Aram Beecham  79 y.o. male  PRE-OPERATIVE DIAGNOSIS:  Left Quad Tendon Rupture  POST-OPERATIVE DIAGNOSIS:  Left Quad Tendon Rupture  PROCEDURE:  Procedure(s) with comments: Left Quadriceps Tendon Repair (Left) - Left Quadriceps Tendon Repair  SURGEON:  Surgeon(s) and Role: Annell Greening MD    * Eldred Manges, MD - Primary  PHYSICIAN ASSISTANT:   ASSISTANTS: none   ANESTHESIA:   local and general  EBL:  Total I/O In: 1000 [I.V.:1000] Out: -   BLOOD ADMINISTERED:none  DRAINS: none   LOCAL MEDICATIONS USED:  MARCAINE     SPECIMEN:  No Specimen  DISPOSITION OF SPECIMEN:  N/A  COUNTS:  YES  TOURNIQUET:  * Missing tourniquet times found for documented tourniquets in log:  [id number] *  DICTATION: .Other Dictation: Dictation Number [id number]  PLAN OF CARE: already inpatient  PATIENT DISPOSITION:  PACU - hemodynamically stable.   Delay start of Pharmacological VTE agent (>24hrs) due to surgical blood loss or risk of bleeding: not applicable

## 2013-10-31 NOTE — Interval H&P Note (Signed)
History and Physical Interval Note:  10/31/2013 5:46 PM  Edward [name]  has presented today for surgery, with the diagnosis of Left Quad Tendon Rupture  The various methods of treatment have been discussed with the patient and family. After consideration of risks, benefits and other options for treatment, the patient has consented to  Procedure(s) with comments: Left Quadriceps Tendon Repair (Left) - Left Quadriceps Tendon Repair as a surgical intervention .  The patient's history has been reviewed, patient examined, no change in status, stable for surgery.  I have reviewed the patient's chart and labs.  Questions were answered to the patient's satisfaction.     [name]

## 2013-11-01 MED ORDER — NIACIN ER (ANTIHYPERLIPIDEMIC) 500 MG PO TBCR
500.0000 mg | EXTENDED_RELEASE_TABLET | Freq: Every day | ORAL | Status: DC
  Filled 2013-11-01: qty 1

## 2013-11-01 NOTE — Evaluation (Signed)
Physical Therapy Evaluation Patient Details Name: Edward [name] MRN: [id number] DOB: [birth year] Today's Date: 11/01/2013 Time: 7829-5621 PT Time Calculation (min): 25 min  PT Assessment / Plan / Recommendation History of Present Illness  Pt is a 79 y/o male who presented to the ED after falling in his yard with immediate pain and inability to get up. Pt is now s/p L quad tendon repair.  Clinical Impression  This patient presents with acute pain and decreased functional independence following the above mentioned procedure. At the time of PT eval, pt required min assist for transfers, but was able to ambulate to the chair without assist. Difficulty keeping LLE off floor but was able to slide it along while maintaining NWB status. This patient is appropriate for skilled PT interventions to address functional limitations, improve safety and independence with functional mobility, and return to PLOF.     PT Assessment  Patient needs continued PT services    Follow Up Recommendations  Home health PT    Does the patient have the potential to tolerate intense rehabilitation      Barriers to Discharge        Equipment Recommendations  Rolling walker with 5" wheels;3in1 (PT)    Recommendations for Other Services     Frequency Min 5X/week    Precautions / Restrictions Precautions Precautions: Fall Required Braces or Orthoses: Knee Immobilizer - Left Knee Immobilizer - Left: On at all times Restrictions Weight Bearing Restrictions: Yes RLE Weight Bearing: Non weight bearing   Pertinent Vitals/Pain 6/10 at rest      Mobility  Bed Mobility Bed Mobility: Supine to Sit;Sitting - Scoot to Edge of Bed Supine to Sit: 4: Min assist;HOB flat;With rails Sitting - Scoot to Edge of Bed: 5: Supervision;With rail Details for Bed Mobility Assistance: Assist for movement of LLE to EOB. Pt requires increased time to scoot out far enough for feet to touch ground. Transfers Transfers: Sit to  Stand;Stand to Sit Sit to Stand: 4: Min assist;From bed;With upper extremity assist Stand to Sit: 4: Min assist;To chair/3-in-1;With upper extremity assist Details for Transfer Assistance: VC's for hand placement and safety awareness with the RW. Pt also cued for controlled descent to chair. Ambulation/Gait Ambulation/Gait Assistance: 4: Min guard Ambulation Distance (Feet): 5 Feet Assistive device: Rolling walker Ambulation/Gait Assistance Details: VC's for NWB status on LLE. Pt unable to hold foot off ground, however did not put weight through the LLE as he slid it along.  Gait Pattern: Step-to pattern Gait velocity: Decreased Stairs: No    Exercises     PT Diagnosis: Difficulty walking;Acute pain  PT Problem List: Decreased strength;Decreased range of motion;Decreased activity tolerance;Decreased balance;Decreased mobility;Decreased knowledge of use of DME;Decreased safety awareness;Pain;Decreased knowledge of precautions PT Treatment Interventions: DME instruction;Gait training;Stair training;Functional mobility training;Therapeutic activities;Therapeutic exercise;Neuromuscular re-education;Patient/family education     PT Goals(Current goals can be found in the care plan section) Acute Rehab PT Goals Patient Stated Goal: To return home with wife PT Goal Formulation: With patient Time For Goal Achievement: 11/08/13 Potential to Achieve Goals: Good  Visit Information  Last PT Received On: 11/01/13 Assistance Needed: +1 History of Present Illness: Pt is a 79 y/o male who presented to the ED after falling in his yard with immediate pain and inability to get up. Pt is now s/p L quad tendon repair.       Prior Functioning  Home Living Family/patient expects to be discharged to:: Private residence Living Arrangements: Spouse/significant other Available Help at Discharge: Family;Friend(s);Available 24  hours/day Type of Home: House Home Access: Stairs to enter [employer] of Steps: 4 (into the garage) Entrance Stairs-Rails: Left Home Layout: One level Home Equipment: Shower seat (Elevated toilet seat) Prior Function Level of Independence: Independent Communication Communication: No difficulties Dominant Hand: Right    Cognition  Cognition Arousal/Alertness: Awake/alert Behavior During Therapy: WFL for tasks assessed/performed Overall Cognitive Status: Within Functional Limits for tasks assessed    Extremity/Trunk Assessment Upper Extremity Assessment Upper Extremity Assessment: Overall WFL for tasks assessed Lower Extremity Assessment Lower Extremity Assessment: LLE deficits/detail LLE Deficits / Details: Decreased strength and ROM consistent with quad tendon repair LLE: Unable to fully assess due to pain;Unable to fully assess due to immobilization Cervical / Trunk Assessment Cervical / Trunk Assessment: Normal   Balance Balance Balance Assessed: Yes Static Sitting Balance Static Sitting - Balance Support: Feet supported;Bilateral upper extremity supported Static Sitting - Level of Assistance: 5: Stand by assistance Static Sitting - Comment/# of Minutes: 2 Static Standing Balance Static Standing - Balance Support: Bilateral upper extremity supported Static Standing - Level of Assistance: 4: Min assist Static Standing - Comment/# of Minutes: Assist needed at initial stand, however did not require further assistance after that.  End of Session PT - End of Session Equipment Utilized During Treatment: Gait belt;Left knee immobilizer Activity Tolerance: Patient tolerated treatment well Patient left: in chair;with call bell/phone within reach Nurse Communication: Mobility status  GP Functional Assessment Tool Used: Clinical Judgement Functional Limitation: Mobility: Walking and moving around Mobility: Walking and Moving Around Current Status ([id number]): At least 60 percent but less than 80 percent impaired, limited or  restricted Mobility: Walking and Moving Around Goal Status [phone]): At least 60 percent but less than 80 percent impaired, limited or restricted   Ruthann Cancer 11/01/2013, 12:16 PM  Ruthann Cancer, PT, DPT [phone]

## 2013-11-01 NOTE — Progress Notes (Signed)
11/01/13 Spoke with patient and his wife about HHC. They selected [facility]. Contacted Renette Hsu with [facility] and set up HHPT. Patient agreeable with standard wheelchair, rolling walker and 3N1. Contacted Darian at [facility] and requested standard wheelchair, 3N1 and rolling walker be delivered to patient's room prior to d/c.No other d/c needs identified. Will continue to follow.  Jacquelynn Cree RN, BSN, CCM

## 2013-11-01 NOTE — Evaluation (Signed)
Occupational Therapy Evaluation Patient Details Name: Edward [name] MRN: [id number] DOB: [birth year] Today's Date: 11/01/2013 Time: 4098-1191 OT Time Calculation (min): 33 min  OT Assessment / Plan / Recommendation History of present illness Pt is a 79 y/o male who presented to the ED after falling in his yard with immediate pain and inability to get up. Pt is now s/p L quad tendon repair.   Clinical Impression   This 79 yo male admitted and underwent above presents to acute OT with decreased AROM and NWB'ing LLE all affecting pt's PLOF at Independent level. Will benefit from acute OT without need for follow up.    OT Assessment  Patient needs continued OT Services    Follow Up Recommendations  No OT follow up       Equipment Recommendations  3 in 1 bedside comode;Wheelchair (measurements OT);Wheelchair cushion (measurements OT) (elevating leg rests, anti-tippers)       Frequency  Min 2X/week    Precautions / Restrictions Precautions Precautions: Fall Required Braces or Orthoses: Knee Immobilizer - Left Knee Immobilizer - Left: On at all times Restrictions Weight Bearing Restrictions: Yes RLE Weight Bearing: Non weight bearing       ADL  Eating/Feeding: Independent Where Assessed - Eating/Feeding: Chair Grooming: Set up Where Assessed - Grooming: Supported sitting Upper Body Bathing: Set up Where Assessed - Upper Body Bathing: Supported sitting Lower Body Bathing: Maximal assistance Where Assessed - Lower Body Bathing: Supported sit to stand Upper Body Dressing: Minimal assistance Where Assessed - Upper Body Dressing: Supported sitting Lower Body Dressing: +1 Total assistance Where Assessed - Lower Body Dressing: Supported sit to Pharmacist, hospital: Minimal Dentist Method: Sit to Barista:  (recliner>bed 3 feet away) Toileting - Architect and Hygiene: Moderate assistance Where Assessed - Toileting Clothing  Manipulation and Hygiene: Standing Equipment Used: Gait belt;Rolling walker;Knee Immobilizer Transfers/Ambulation Related to ADLs: min A for all with RW ADL Comments: wife to A prn. Advised them to wear clothes over brace for now, but under brace once MD allows leg to start bending. Advised wife to wash LLE in bed or while reclined in recliner    OT Diagnosis: Generalized weakness  OT Problem List: Decreased range of motion;Decreased activity tolerance;Impaired balance (sitting and/or standing);Decreased knowledge of use of DME or AE OT Treatment Interventions: Self-care/ADL training;Balance training;DME and/or AE instruction;Patient/family education   OT Goals(Current goals can be found in the care plan section) Acute Rehab OT Goals Patient Stated Goal: To return home with wife OT Goal Formulation: With patient Time For Goal Achievement: 11/08/13 Potential to Achieve Goals: Good  Visit Information  Last OT Received On: 11/01/13 Assistance Needed: +1 History of Present Illness: Pt is a 79 y/o male who presented to the ED after falling in his yard with immediate pain and inability to get up. Pt is now s/p L quad tendon repair.       Prior Functioning     Home Living Family/patient expects to be discharged to:: Private residence Living Arrangements: Spouse/significant other Available Help at Discharge: Family;Friend(s);Available 24 hours/day Type of Home: House Home Access: Stairs to enter [employer] of Steps: 4 (into the garage) Entrance Stairs-Rails: Left Home Layout: One level Home Equipment: Shower seat;Grab bars - tub/shower Prior Function Level of Independence: Independent Communication Communication: No difficulties Dominant Hand: Right         Vision/Perception Vision - History Patient Visual Report: No change from baseline   Cognition  Cognition Arousal/Alertness: Awake/alert Behavior During  Therapy: WFL for tasks assessed/performed Overall  Cognitive Status: Within Functional Limits for tasks assessed    Extremity/Trunk Assessment Upper Extremity Assessment Upper Extremity Assessment: Overall WFL for tasks assessed Lower Extremity Assessment    Mobility Bed Mobility Bed Mobility: Sit to Supine;Scooting to HOB Sit to Supine: 4: Min assist;HOB flat (for LLE) Scooting to HOB: 4: Min assist (for LLE) Details for Bed Mobility Assistance: Assist for movement of LLE to EOB. Pt requires increased time to scoot out far enough for feet to touch ground. Transfers Transfers: Sit to Stand;Stand to Sit Sit to Stand: 4: Min assist;With upper extremity assist;With armrests;From chair/3-in-1 Stand to Sit: 4: Min assist;With upper extremity assist;With armrests;To chair/3-in-1 Details for Transfer Assistance: VCs for safe hand placement           End of Session OT - End of Session Equipment Utilized During Treatment: Gait belt;Rolling walker;Left knee immobilizer Activity Tolerance: Patient limited by fatigue Patient left: in bed;with call bell/phone within reach;with family/visitor present   Functional Assessment Tool Used: Clinical observation Functional Limitation: Self care Self Care Current Status (Z6109): At least 80 percent but less than 100 percent impaired, limited or restricted Self Care Goal Status (U0454): At least 40 percent but less than 60 percent impaired, limited or restricted   Evette Georges [phone] 11/01/2013, 1:07 PM

## 2013-11-01 NOTE — Progress Notes (Signed)
Subjective: 1 Day Post-Op Procedure(s) (LRB): Left Quadriceps Tendon Repair (Left) Patient reports pain as mild.    Objective: Vital signs in last 24 hours: Temp:  [97.4 F (36.3 C)-99 F (37.2 C)] 99 F (37.2 C) (11/13 0556) Pulse Rate:  [60-102] 66 (11/13 0556) Resp:  [16-22] 16 (11/13 0556) BP: (128-152)/(55-77) 129/69 mmHg (11/13 0556) SpO2:  [96 %-100 %] 96 % (11/13 0556)  Intake/Output from previous day: 11/12 0701 - 11/13 0700 In: 2210 [P.O.:300; I.V.:1910] Out: 150 [Urine:150] Intake/Output this shift:     Recent Labs  10/30/13 1236  HGB 15.7    Recent Labs  10/30/13 1236  WBC 12.4*  RBC 4.86  HCT 45.2  PLT 152    Recent Labs  10/30/13 1329  NA 140  K 4.3  CL 103  CO2 26  BUN 24*  CREATININE 1.57*  GLUCOSE 166*  CALCIUM 8.9   No results found for this basename: LABPT, INR,  in the last 72 hours  Neurologically intact  Assessment/Plan: 1 Day Post-Op Procedure(s) (LRB): Left Quadriceps Tendon Repair (Left) Up with therapy  Theon Sobotka C 11/01/2013, 7:59 AM

## 2013-11-02 NOTE — Progress Notes (Signed)
Subjective: 2 Days Post-Op Procedure(s) (LRB): Left Quadriceps Tendon Repair (Left) Patient reports pain as mild.    Objective: Vital signs in last 24 hours: Temp:  [97.4 F (36.3 C)-99 F (37.2 C)] 98.2 F (36.8 C) (11/14 0610) Pulse Rate:  [70-75] 75 (11/14 0610) Resp:  [18] 18 (11/14 0610) BP: (134-145)/(58-86) 140/58 mmHg (11/14 0610) SpO2:  [98 %-99 %] 99 % (11/14 0610)  Intake/Output from previous day: 11/13 0701 - 11/14 0700 In: 1390 [P.O.:480; I.V.:910] Out: 1525 [Urine:1525] Intake/Output this shift:     Recent Labs  10/30/13 1236  HGB 15.7    Recent Labs  10/30/13 1236  WBC 12.4*  RBC 4.86  HCT 45.2  PLT 152    Recent Labs  10/30/13 1329  NA 140  K 4.3  CL 103  CO2 26  BUN 24*  CREATININE 1.57*  GLUCOSE 166*  CALCIUM 8.9   No results found for this basename: LABPT, INR,  in the last 72 hours  Neurologically intact  Assessment/Plan: 2 Days Post-Op Procedure(s) (LRB): Left Quadriceps Tendon Repair (Left) Discharge home with home health  Jessieca Rhem C 11/02/2013, 8:02 AM

## 2013-11-02 NOTE — Op Note (Signed)
NAMENEHEMYAH, FOUSHEE NO.:  [id number]  MEDICAL RECORD NO.:  [id number]  LOCATION:  [id number]                        FACILITY:  [facility]  PHYSICIAN:  Abdoulaye Drum C. Ophelia Charter, M.D.    DATE OF BIRTH:  01/29/1933  DATE OF PROCEDURE:  10/31/2013 DATE OF DISCHARGE:                              OPERATIVE REPORT   PREOPERATIVE DIAGNOSIS:  Acute left quadriceps tendon rupture.  POSTOPERATIVE DIAGNOSIS:  Acute left quadriceps tendon rupture.  PROCEDURE:  Repair of left quadriceps tendon through drill holes in the patella.  SURGEON:  Evamarie Raetz C. Ophelia Charter, M.D.  ANESTHESIA:  General plus Marcaine skin local.  BRIEF HISTORY:  This 79 year old male was leaf blowing stepped off about 2 foot step off and when he landed on his left leg, he states his leg gave way and he fell to the ground.  He had acute quadriceps rupture and had some mild aching in the past, and x-rays demonstrated patella baja and calcification of the quadriceps tendon from chronic tendinopathy.  PROCEDURE IN DETAIL:  After induction of general anesthesia, orotracheal intubation, Ancef given prophylactically, standard prepping with DuraPrep, proximal thigh tourniquet, impervious stockinette, extremity sheets and drapes, __________ proximally sterilely over the tourniquet, sterile skin marker was used for midline incision and Betadine IO band drape was applied.  Time-out procedure was completed, right leg was wrapped in Esmarch, midline incision was made to subcutaneous tissue split.  Hematoma was evacuated, although, suprapatellar pouch with chronic tendinopathy changes with palpable nodules of calcium throughout the quadriceps tendon.  There was tearing obliquely toward the medial aspect and extended to the medial collateral ligament.  On the lateral aspect, it tore more proximal, oblique, as it progressed posteriorly. Using the largest [place] needle, holes were drilled in the patella, total of 6 __________ sutures were  placed and Mellody Dance needles on the __________ single suture of the remaining two sutures passed through each hole as the tendons were pulled on through the holes in the bone.  The fresh cut quad tendon pulled up directly against the area, where it __________ off the bone.  With tension applied all the other sutures, each one was sequentially tied down with the good tension over tension in the tendon. Next, starting thus far, posteriorly it was possible first on the medial on the lateral side, figure-of-eight sutures were used for repair of the medial and lateral capsule.  Knee was able to be flexed at 80 degrees with intact tendon. Tourniquet was deflated.  Subcutaneous tissue reapproximated with 2-0 Vicryl.  Skin and staple closure in the midline.  Xeroform after Marcaine infiltration, postop dressing, 4x4s, ABD, Webril, Ace wrap, and knee immobilizer.  Instrument count and needle count was correct.     Tyshika Baldridge C. Ophelia Charter, M.D.     MCY/MEDQ  D:  10/31/2013  T:  11/01/2013  Job:  [id number]

## 2013-11-02 NOTE — Progress Notes (Signed)
Physical Therapy Treatment Patient Details Name: Edward [name] MRN: [id number] DOB: 1933/06/16 Today's Date: 11/02/2013 Time: 0865-7846 PT Time Calculation (min): 29 min  PT Assessment / Plan / Recommendation  History of Present Illness Pt is a 79 y/o male who presented to the ED after falling in his yard with immediate pain and inability to get up. Pt is now s/p L quad tendon repair.   PT Comments   Pt was able to negotiate steps this session with pt seated and boosting up on steps. Wife reports neighbors will be present to take them home from the hospital and assist pt to get in the house. Equipment received prior to PT session and walker was readjusted to proper height. Pt/family education on car transfer and safety/use of wheelchair.  Follow Up Recommendations  Home health PT     Does the patient have the potential to tolerate intense rehabilitation     Barriers to Discharge        Equipment Recommendations  Rolling walker with 5" wheels;3in1 (PT)    Recommendations for Other Services    Frequency Min 5X/week   Progress towards PT Goals Progress towards PT goals: Progressing toward goals  Plan Current plan remains appropriate    Precautions / Restrictions Precautions Precautions: Fall Required Braces or Orthoses: Knee Immobilizer - Left Knee Immobilizer - Left: On at all times Restrictions Weight Bearing Restrictions: Yes RLE Weight Bearing: Non weight bearing   Pertinent Vitals/Pain Pt reports his pain is better but does not rate it on a 0-10 scale    Mobility  Bed Mobility Bed Mobility: Not assessed Details for Bed Mobility Assistance: Pt received sitting up in recliner Transfers Transfers: Sit to Stand;Stand to Sit Sit to Stand: 4: Min guard;From chair/3-in-1;With upper extremity assist Stand to Sit: 4: Min guard;To chair/3-in-1;With upper extremity assist Details for Transfer Assistance: VC's for NWB status and to extend LLE out when transitioning to sitting  position.  Ambulation/Gait Ambulation/Gait Assistance: 4: Min guard Ambulation Distance (Feet): 10 Feet (5 feet x2) Assistive device: Rolling walker Ambulation/Gait Assistance Details: Pt ambulated from chair to w/c and from w/c to stairs. Gait Pattern: Step-to pattern Gait velocity: Decreased Stairs: Yes Stairs Assistance: 5: Supervision Stairs Assistance Details (indicate cue type and reason): Pt unable to negotiate steps utilizing backwards walker technique, however was able to bump up steps on bottom with supervision Stair Management Technique: Backwards;Seated/boosting Number of Stairs: 5 Wheelchair Mobility Wheelchair Mobility: No    Exercises     PT Diagnosis:    PT Problem List:   PT Treatment Interventions:     PT Goals (current goals can now be found in the care plan section) Acute Rehab PT Goals Patient Stated Goal: To return home with wife PT Goal Formulation: With patient Time For Goal Achievement: 11/08/13 Potential to Achieve Goals: Good  Visit Information  Last PT Received On: 11/02/13 Assistance Needed: +1 History of Present Illness: Pt is a 79 y/o male who presented to the ED after falling in his yard with immediate pain and inability to get up. Pt is now s/p L quad tendon repair.    Subjective Data  Subjective: "I just need to be able to get in the house." Patient Stated Goal: To return home with wife   Cognition  Cognition Arousal/Alertness: Awake/alert Behavior During Therapy: WFL for tasks assessed/performed Overall Cognitive Status: Within Functional Limits for tasks assessed    Balance     End of Session PT - End of Session Equipment Utilized  During Treatment: Gait belt;Left knee immobilizer Activity Tolerance: Patient tolerated treatment well Patient left: in chair;with call bell/phone within reach;with family/visitor present Nurse Communication: Mobility status   GP     Ruthann Cancer 11/02/2013, 12:34 PM  Ruthann Cancer, PT,  DPT [phone]

## 2013-11-02 NOTE — Progress Notes (Signed)
Occupational Therapy Treatment Patient Details Name: Edward [name] MRN: [id number] DOB: 1933/11/23 Today's Date: 11/02/2013 Time: 0700-0731 OT Time Calculation (min): 31 min  OT Assessment / Plan / Recommendation  History of present illness Pt is a 79 y/o male who presented to the ED after falling in his yard with immediate pain and inability to get up. Pt is now s/p L quad tendon repair.   OT comments  Pt. Awake upon arrival.  Agreeable to oob.  Note increased anxiety throughout session.  Pt. Appears to have literal interpretation of NWB thinking it means not able to move the extremity at all.  Required max encouragement and inst. To engage in moving LLE and participation with functional transfers.  Declined shower transfer stating wife will assist with sponge bathing.  Will discuss with P.T. To see how pt. Participates with them as there are some concerns if he and wife will be able to manage at home if he con't. With anxiety and fear of movement.  States the fall really affected his confidence with mobility.           Barriers to Discharge   fear of mobility, and integration of NWB status into his functional tasks    Equipment Recommendations  3 in 1 bedside comode        Frequency Min 2X/week   Progress towards OT Goals Progress towards OT goals: Progressing toward goals  Plan Other (comment) (will discuss with P.T. for their input)    Precautions / Restrictions Precautions Precautions: Fall Required Braces or Orthoses: Knee Immobilizer - Left Knee Immobilizer - Left: On at all times Restrictions RLE Weight Bearing: Non weight bearing   Pertinent Vitals/Pain 3/10 before movement, grimacing more when transitioning to eob    ADL  Toilet Transfer: Simulated;Minimal assistance Toilet Transfer Method: Sit to stand;Stand pivot Acupuncturist: Other (comment) (pivot to recliner) Toileting - Clothing Manipulation and Hygiene: Simulated;Moderate assistance Where  Assessed - Toileting Clothing Manipulation and Hygiene: Standing Tub/Shower Transfer: Other (comment) (pt. declined stating wife will assist with sponge bathing) Equipment Used: Rolling walker;Knee Immobilizer Transfers/Ambulation Related to ADLs: min a for sit/stand and pivot wtih rw ADL Comments: pt. declined shower stall transfer, states wife will assist with sponge bathing initially.  and states he is too "scared" to try secondary to nwb status and fear of falling     OT Goals(current goals can now be found in the care plan section)    Visit Information  Last OT Received On: 11/02/13 History of Present Illness: Pt is a 79 y/o male who presented to the ED after falling in his yard with immediate pain and inability to get up. Pt is now s/p L quad tendon repair.    Subjective Data   "the doctor said no weight at all" (pt. Con't. To repeat this even with thorough explanation and demonstration of tech. To safely manage LLE)          Cognition  Cognition Arousal/Alertness: Awake/alert Behavior During Therapy: WFL for tasks assessed/performed Overall Cognitive Status: Within Functional Limits for tasks assessed    Mobility  Bed Mobility Details for Bed Mobility Assistance: assist to bring LLE to EOB and to initiate movement.  increased time and max cues/encouragement to move LLE at all.  pt. with increaased anxiety throughout session in regards to his literal interpretation of NWB Transfers Transfers: Sit to Stand;Stand to Sit Sit to Stand: 4: Min assist;With upper extremity assist;From bed;From elevated surface Stand to Sit: 4: Min assist;With upper  extremity assist;With armrests;To chair/3-in-1 Details for Transfer Assistance: cues to try to slide LLE forward while transitioning into sitting               End of Session OT - End of Session Equipment Utilized During Treatment: Rolling walker;Left knee immobilizer Activity Tolerance: Other (comment) (anxiety limiting  participation ) Patient left: in chair;with call bell/phone within reach       Robet Leu, COTA/L 11/02/2013, 7:39 AM

## 2013-11-04 DIAGNOSIS — IMO0001 Reserved for inherently not codable concepts without codable children: Secondary | ICD-10-CM | POA: Diagnosis not present

## 2013-11-04 DIAGNOSIS — Z9181 History of falling: Secondary | ICD-10-CM | POA: Diagnosis not present

## 2013-11-04 DIAGNOSIS — IMO0002 Reserved for concepts with insufficient information to code with codable children: Secondary | ICD-10-CM | POA: Diagnosis not present

## 2013-11-04 DIAGNOSIS — Z4889 Encounter for other specified surgical aftercare: Secondary | ICD-10-CM | POA: Diagnosis not present

## 2013-11-05 ENCOUNTER — Encounter (HOSPITAL_COMMUNITY): Payer: Self-pay | Admitting: Orthopaedic Surgery

## 2013-11-06 DIAGNOSIS — IMO0001 Reserved for inherently not codable concepts without codable children: Secondary | ICD-10-CM | POA: Diagnosis not present

## 2013-11-06 DIAGNOSIS — IMO0002 Reserved for concepts with insufficient information to code with codable children: Secondary | ICD-10-CM | POA: Diagnosis not present

## 2013-11-06 DIAGNOSIS — Z9181 History of falling: Secondary | ICD-10-CM | POA: Diagnosis not present

## 2013-11-06 DIAGNOSIS — Z4889 Encounter for other specified surgical aftercare: Secondary | ICD-10-CM | POA: Diagnosis not present

## 2013-11-07 DIAGNOSIS — Z9181 History of falling: Secondary | ICD-10-CM | POA: Diagnosis not present

## 2013-11-07 DIAGNOSIS — IMO0002 Reserved for concepts with insufficient information to code with codable children: Secondary | ICD-10-CM | POA: Diagnosis not present

## 2013-11-07 DIAGNOSIS — Z4889 Encounter for other specified surgical aftercare: Secondary | ICD-10-CM | POA: Diagnosis not present

## 2013-11-07 DIAGNOSIS — IMO0001 Reserved for inherently not codable concepts without codable children: Secondary | ICD-10-CM | POA: Diagnosis not present

## 2013-11-08 DIAGNOSIS — Z9181 History of falling: Secondary | ICD-10-CM | POA: Diagnosis not present

## 2013-11-08 DIAGNOSIS — IMO0001 Reserved for inherently not codable concepts without codable children: Secondary | ICD-10-CM | POA: Diagnosis not present

## 2013-11-08 DIAGNOSIS — IMO0002 Reserved for concepts with insufficient information to code with codable children: Secondary | ICD-10-CM | POA: Diagnosis not present

## 2013-11-08 DIAGNOSIS — Z4889 Encounter for other specified surgical aftercare: Secondary | ICD-10-CM | POA: Diagnosis not present

## 2013-11-12 DIAGNOSIS — Z4889 Encounter for other specified surgical aftercare: Secondary | ICD-10-CM | POA: Diagnosis not present

## 2013-11-12 DIAGNOSIS — IMO0002 Reserved for concepts with insufficient information to code with codable children: Secondary | ICD-10-CM | POA: Diagnosis not present

## 2013-11-12 DIAGNOSIS — S76119A Strain of unspecified quadriceps muscle, fascia and tendon, initial encounter: Secondary | ICD-10-CM

## 2013-11-12 DIAGNOSIS — Z9181 History of falling: Secondary | ICD-10-CM | POA: Diagnosis not present

## 2013-11-12 DIAGNOSIS — IMO0001 Reserved for inherently not codable concepts without codable children: Secondary | ICD-10-CM | POA: Diagnosis not present

## 2013-11-12 NOTE — Discharge Summary (Signed)
Physician Discharge Summary  Patient ID: SHLOIMY MICHALSKI MRN: [id number] DOB/AGE: [birth year] 79 y.o.  Admit date: 10/30/2013 Discharge date: 11/12/2013  Admission Diagnoses:  Rupture quadriceps tendon LEFT  Discharge Diagnoses:  Principal Problem:   Rupture quadriceps tendon LEFT  Past Medical History  Diagnosis Date  . Hyperlipidemia   . Hypertension   . Elevated glucose   . Venous insufficiency of leg   . Drug-induced low platelet count     from NSAIDS  . Prostate cancer   . Complex renal cyst RIGHT--  MONITORED BY DR Isabel Caprice  . Chronic renal insufficiency     DR WEBB  . Nocturia     Surgeries: Procedure(s): Left Quadriceps Tendon Repair on 10/30/2013 - 10/31/2013   Consultants (if any):  NONE  Discharged Condition: Improved  Hospital Course: Edward [name] is an 79 y.o. male who was admitted 10/30/2013 with a diagnosis of Rupture quadriceps tendon and went to the operating room on 10/30/2013 - 10/31/2013 and underwent the above named procedures.    He was given perioperative antibiotics:      Anti-infectives   Start     Dose/Rate Route Frequency Ordered Stop   11/01/13 0030  ceFAZolin (ANCEF) IVPB 1 g/50 mL premix     1 g 100 mL/hr over 30 Minutes Intravenous Every 6 hours 10/31/13 2059 11/01/13 1220   10/31/13 2359  ceFAZolin (ANCEF) IVPB 2 g/50 mL premix  Status:  Discontinued     2 g 100 mL/hr over 30 Minutes Intravenous  Once 10/31/13 2117 10/31/13 2247   10/31/13 1812  ceFAZolin (ANCEF) 2-3 GM-% IVPB SOLR    Comments:  Brien Mates   : cabinet override      10/31/13 1812 10/31/13 1832   10/31/13 0600  ceFAZolin (ANCEF) IVPB 2 g/50 mL premix  Status:  Discontinued     2 g 100 mL/hr over 30 Minutes Intravenous On call to O.R. 10/30/13 1803 10/30/13 2059    .  He was given sequential compression devices, early ambulation, and ASPIRIN for DVT prophylaxis.  He benefited maximally from the hospital stay and there were no complications.    Recent  vital signs:  Filed Vitals:   11/02/13 0610  BP: 140/58  Pulse: 75  Temp: 98.2 F (36.8 C)  Resp: 18    Recent laboratory studies:  Lab Results  Component Value Date   HGB 15.7 10/30/2013   HGB 15.8 10/03/2013   HGB 16.3 12/01/2012   Lab Results  Component Value Date   WBC 12.4* 10/30/2013   PLT 152 10/30/2013   No results found for this basename: INR   Lab Results  Component Value Date   NA 140 10/30/2013   K 4.3 10/30/2013   CL 103 10/30/2013   CO2 26 10/30/2013   BUN 24* 10/30/2013   CREATININE 1.57* 10/30/2013   GLUCOSE 166* 10/30/2013    Discharge Medications:     Medication List         aspirin 81 MG tablet  Take 81 mg by mouth daily.     aspirin 325 MG tablet  Take 1 tablet (325 mg total) by mouth 2 (two) times daily.     aspirin 81 MG chewable tablet  Chew 81 mg by mouth daily.     CENTRUM SILVER tablet  Take 1 tablet by mouth daily.     folic acid 1 MG tablet  Commonly known as:  FOLVITE  Take 1 tablet (1 mg total) by mouth daily.  niacin 500 MG CR tablet  Commonly known as:  NIASPAN  Take 500 mg by mouth daily.     olmesartan 40 MG tablet  Commonly known as:  BENICAR  Take 40 mg by mouth daily.     oxyCODONE-acetaminophen 5-325 MG per tablet  Commonly known as:  ROXICET  Take 1-2 tablets by mouth every 4 (four) hours as needed.     triamterene-hydrochlorothiazide 37.5-25 MG per capsule  Commonly known as:  DYAZIDE  Take 1 capsule by mouth every morning.     Vitamin D (Ergocalciferol) 50000 UNITS Caps capsule  Commonly known as:  DRISDOL  Take 50,000 Units by mouth every 30 (thirty) days. First of the month        Diagnostic Studies: Dg Chest 2 View  10/30/2013   CLINICAL DATA:  Fall, preop  EXAM: CHEST  2 VIEW  COMPARISON:  None.  FINDINGS: Lungs are clear.  No pleural effusion or pneumothorax.  The heart is normal in size.  Mild elevation of the left hemidiaphragm.  Mild degenerative changes of the visualized thoracolumbar  spine.  IMPRESSION: No evidence of acute cardiopulmonary disease.   Electronically Signed   By: Charline Bills M.D.   On: 10/30/2013 14:09   Dg Knee Left Port  10/30/2013   CLINICAL DATA:  Left knee pain status post fall.  EXAM: PORTABLE LEFT KNEE - 1-2 VIEW  COMPARISON:  October 12, 2012.  FINDINGS: An AP and cross-table lateral view of the left knee reveal the bones to be adequately mineralized. There is a new bony density along the anterior aspect of the distal femoral metaphysis seen on the lateral film. It is not clearly evident on the frontal film. This may reflect avulsion of an area of calcification previously demonstrated along the anterior superior margin of the patella which is not evident today. There is no evidence of a femoral condylar fracture nor of a tibial plateau fracture. The proximal fibula appears intact. There is no definite joint effusion though there may be mild soft tissue swelling in the suprapatellar region.  IMPRESSION: 1. Bony density previously demonstrated associated with the superior margin of the patella is not evident today. However, more proximally there is calcification demonstrated along the anterior aspect of the distal femoral metaphysis on the lateral film. There is an associated overlying soft tissue swelling may reflect quadriceps tendon injury with avulsion of a area of calcification at the tendinous insertion along the superior margin of the patella. 2. There is no evidence of a femoral condylar fracture nor of a tibial plateau or proximal fibular fracture or dislocation.   Electronically Signed   By: David  [place]   On: 10/30/2013 12:54    Disposition: 01-Home or Self Care   Future Appointments Provider Department Dept Phone   04/03/2014 8:00 AM Tresa Garter, MD [facility] [phone]     DISCHARGE INSTRUCTIONS: Knee immobilizer at all times.  Keep dressing dry and clean.  No motion of knee. Non weight bearing on left  leg.  Use walker for ambulation.   Follow-up Information   Follow up with Eldred Manges, MD. Schedule an appointment as soon as possible for a visit in 2 weeks.   Specialty:  Orthopedic Surgery   Contact information:   [address] [place] [address] [phone]        Signed: Wende Neighbors 11/12/2013, 3:42 PM

## 2013-11-13 DIAGNOSIS — Z9181 History of falling: Secondary | ICD-10-CM | POA: Diagnosis not present

## 2013-11-13 DIAGNOSIS — Z4889 Encounter for other specified surgical aftercare: Secondary | ICD-10-CM | POA: Diagnosis not present

## 2013-11-13 DIAGNOSIS — IMO0002 Reserved for concepts with insufficient information to code with codable children: Secondary | ICD-10-CM | POA: Diagnosis not present

## 2013-11-13 DIAGNOSIS — IMO0001 Reserved for inherently not codable concepts without codable children: Secondary | ICD-10-CM | POA: Diagnosis not present

## 2013-11-17 DIAGNOSIS — IMO0001 Reserved for inherently not codable concepts without codable children: Secondary | ICD-10-CM | POA: Diagnosis not present

## 2013-11-17 DIAGNOSIS — Z9181 History of falling: Secondary | ICD-10-CM | POA: Diagnosis not present

## 2013-11-17 DIAGNOSIS — IMO0002 Reserved for concepts with insufficient information to code with codable children: Secondary | ICD-10-CM | POA: Diagnosis not present

## 2013-11-17 DIAGNOSIS — Z4889 Encounter for other specified surgical aftercare: Secondary | ICD-10-CM | POA: Diagnosis not present

## 2013-11-20 DIAGNOSIS — Z9181 History of falling: Secondary | ICD-10-CM | POA: Diagnosis not present

## 2013-11-20 DIAGNOSIS — IMO0002 Reserved for concepts with insufficient information to code with codable children: Secondary | ICD-10-CM | POA: Diagnosis not present

## 2013-11-20 DIAGNOSIS — Z4889 Encounter for other specified surgical aftercare: Secondary | ICD-10-CM | POA: Diagnosis not present

## 2013-11-20 DIAGNOSIS — IMO0001 Reserved for inherently not codable concepts without codable children: Secondary | ICD-10-CM | POA: Diagnosis not present

## 2013-11-22 DIAGNOSIS — IMO0002 Reserved for concepts with insufficient information to code with codable children: Secondary | ICD-10-CM | POA: Diagnosis not present

## 2013-11-22 DIAGNOSIS — IMO0001 Reserved for inherently not codable concepts without codable children: Secondary | ICD-10-CM | POA: Diagnosis not present

## 2013-11-22 DIAGNOSIS — Z9181 History of falling: Secondary | ICD-10-CM | POA: Diagnosis not present

## 2013-11-22 DIAGNOSIS — Z4889 Encounter for other specified surgical aftercare: Secondary | ICD-10-CM | POA: Diagnosis not present

## 2013-11-23 DIAGNOSIS — IMO0001 Reserved for inherently not codable concepts without codable children: Secondary | ICD-10-CM | POA: Diagnosis not present

## 2013-11-23 DIAGNOSIS — Z4889 Encounter for other specified surgical aftercare: Secondary | ICD-10-CM | POA: Diagnosis not present

## 2013-11-23 DIAGNOSIS — Z9181 History of falling: Secondary | ICD-10-CM | POA: Diagnosis not present

## 2013-11-23 DIAGNOSIS — IMO0002 Reserved for concepts with insufficient information to code with codable children: Secondary | ICD-10-CM | POA: Diagnosis not present

## 2013-11-27 DIAGNOSIS — Z4889 Encounter for other specified surgical aftercare: Secondary | ICD-10-CM | POA: Diagnosis not present

## 2013-11-27 DIAGNOSIS — IMO0001 Reserved for inherently not codable concepts without codable children: Secondary | ICD-10-CM | POA: Diagnosis not present

## 2013-11-27 DIAGNOSIS — Z9181 History of falling: Secondary | ICD-10-CM | POA: Diagnosis not present

## 2013-11-27 DIAGNOSIS — IMO0002 Reserved for concepts with insufficient information to code with codable children: Secondary | ICD-10-CM | POA: Diagnosis not present

## 2013-11-29 DIAGNOSIS — Z4889 Encounter for other specified surgical aftercare: Secondary | ICD-10-CM | POA: Diagnosis not present

## 2013-11-29 DIAGNOSIS — IMO0001 Reserved for inherently not codable concepts without codable children: Secondary | ICD-10-CM | POA: Diagnosis not present

## 2013-11-29 DIAGNOSIS — Z9181 History of falling: Secondary | ICD-10-CM | POA: Diagnosis not present

## 2013-11-29 DIAGNOSIS — IMO0002 Reserved for concepts with insufficient information to code with codable children: Secondary | ICD-10-CM | POA: Diagnosis not present

## 2013-11-30 DIAGNOSIS — IMO0001 Reserved for inherently not codable concepts without codable children: Secondary | ICD-10-CM | POA: Diagnosis not present

## 2013-11-30 DIAGNOSIS — Z9181 History of falling: Secondary | ICD-10-CM | POA: Diagnosis not present

## 2013-11-30 DIAGNOSIS — IMO0002 Reserved for concepts with insufficient information to code with codable children: Secondary | ICD-10-CM | POA: Diagnosis not present

## 2013-11-30 DIAGNOSIS — Z4889 Encounter for other specified surgical aftercare: Secondary | ICD-10-CM | POA: Diagnosis not present

## 2013-12-25 DIAGNOSIS — R269 Unspecified abnormalities of gait and mobility: Secondary | ICD-10-CM | POA: Diagnosis not present

## 2013-12-25 DIAGNOSIS — M25569 Pain in unspecified knee: Secondary | ICD-10-CM | POA: Diagnosis not present

## 2013-12-25 DIAGNOSIS — M6281 Muscle weakness (generalized): Secondary | ICD-10-CM | POA: Diagnosis not present

## 2013-12-27 DIAGNOSIS — R269 Unspecified abnormalities of gait and mobility: Secondary | ICD-10-CM | POA: Diagnosis not present

## 2013-12-27 DIAGNOSIS — M25569 Pain in unspecified knee: Secondary | ICD-10-CM | POA: Diagnosis not present

## 2013-12-27 DIAGNOSIS — M6281 Muscle weakness (generalized): Secondary | ICD-10-CM | POA: Diagnosis not present

## 2014-01-03 DIAGNOSIS — R269 Unspecified abnormalities of gait and mobility: Secondary | ICD-10-CM | POA: Diagnosis not present

## 2014-01-03 DIAGNOSIS — M25569 Pain in unspecified knee: Secondary | ICD-10-CM | POA: Diagnosis not present

## 2014-01-03 DIAGNOSIS — M6281 Muscle weakness (generalized): Secondary | ICD-10-CM | POA: Diagnosis not present

## 2014-01-08 DIAGNOSIS — R269 Unspecified abnormalities of gait and mobility: Secondary | ICD-10-CM | POA: Diagnosis not present

## 2014-01-08 DIAGNOSIS — M6281 Muscle weakness (generalized): Secondary | ICD-10-CM | POA: Diagnosis not present

## 2014-01-08 DIAGNOSIS — M25569 Pain in unspecified knee: Secondary | ICD-10-CM | POA: Diagnosis not present

## 2014-01-10 DIAGNOSIS — R269 Unspecified abnormalities of gait and mobility: Secondary | ICD-10-CM | POA: Diagnosis not present

## 2014-01-10 DIAGNOSIS — M6281 Muscle weakness (generalized): Secondary | ICD-10-CM | POA: Diagnosis not present

## 2014-01-10 DIAGNOSIS — M25569 Pain in unspecified knee: Secondary | ICD-10-CM | POA: Diagnosis not present

## 2014-01-15 DIAGNOSIS — M25569 Pain in unspecified knee: Secondary | ICD-10-CM | POA: Diagnosis not present

## 2014-01-15 DIAGNOSIS — M6281 Muscle weakness (generalized): Secondary | ICD-10-CM | POA: Diagnosis not present

## 2014-01-15 DIAGNOSIS — R269 Unspecified abnormalities of gait and mobility: Secondary | ICD-10-CM | POA: Diagnosis not present

## 2014-01-17 DIAGNOSIS — M25569 Pain in unspecified knee: Secondary | ICD-10-CM | POA: Diagnosis not present

## 2014-01-17 DIAGNOSIS — M6281 Muscle weakness (generalized): Secondary | ICD-10-CM | POA: Diagnosis not present

## 2014-01-17 DIAGNOSIS — R269 Unspecified abnormalities of gait and mobility: Secondary | ICD-10-CM | POA: Diagnosis not present

## 2014-01-21 DIAGNOSIS — C61 Malignant neoplasm of prostate: Secondary | ICD-10-CM | POA: Diagnosis not present

## 2014-01-24 DIAGNOSIS — M6281 Muscle weakness (generalized): Secondary | ICD-10-CM | POA: Diagnosis not present

## 2014-01-24 DIAGNOSIS — R269 Unspecified abnormalities of gait and mobility: Secondary | ICD-10-CM | POA: Diagnosis not present

## 2014-01-24 DIAGNOSIS — M25569 Pain in unspecified knee: Secondary | ICD-10-CM | POA: Diagnosis not present

## 2014-01-28 DIAGNOSIS — C61 Malignant neoplasm of prostate: Secondary | ICD-10-CM | POA: Diagnosis not present

## 2014-01-31 DIAGNOSIS — R269 Unspecified abnormalities of gait and mobility: Secondary | ICD-10-CM | POA: Diagnosis not present

## 2014-01-31 DIAGNOSIS — M6281 Muscle weakness (generalized): Secondary | ICD-10-CM | POA: Diagnosis not present

## 2014-01-31 DIAGNOSIS — M25569 Pain in unspecified knee: Secondary | ICD-10-CM | POA: Diagnosis not present

## 2014-02-19 DIAGNOSIS — R269 Unspecified abnormalities of gait and mobility: Secondary | ICD-10-CM | POA: Diagnosis not present

## 2014-02-19 DIAGNOSIS — M6281 Muscle weakness (generalized): Secondary | ICD-10-CM | POA: Diagnosis not present

## 2014-02-19 DIAGNOSIS — M25569 Pain in unspecified knee: Secondary | ICD-10-CM | POA: Diagnosis not present

## 2014-04-03 ENCOUNTER — Encounter: Payer: Self-pay | Admitting: Internal Medicine

## 2014-04-03 ENCOUNTER — Other Ambulatory Visit (INDEPENDENT_AMBULATORY_CARE_PROVIDER_SITE_OTHER): Payer: Medicare Other

## 2014-04-03 ENCOUNTER — Ambulatory Visit (INDEPENDENT_AMBULATORY_CARE_PROVIDER_SITE_OTHER): Payer: Medicare Other | Admitting: Internal Medicine

## 2014-04-03 VITALS — BP 118/74 | HR 76 | Temp 98.3°F | Resp 16 | Wt 211.0 lb

## 2014-04-03 DIAGNOSIS — R739 Hyperglycemia, unspecified: Secondary | ICD-10-CM

## 2014-04-03 DIAGNOSIS — S838X9A Sprain of other specified parts of unspecified knee, initial encounter: Secondary | ICD-10-CM

## 2014-04-03 DIAGNOSIS — R7309 Other abnormal glucose: Secondary | ICD-10-CM

## 2014-04-03 DIAGNOSIS — M545 Low back pain, unspecified: Secondary | ICD-10-CM | POA: Diagnosis not present

## 2014-04-03 DIAGNOSIS — R609 Edema, unspecified: Secondary | ICD-10-CM

## 2014-04-03 DIAGNOSIS — I10 Essential (primary) hypertension: Secondary | ICD-10-CM

## 2014-04-03 DIAGNOSIS — S86819A Strain of other muscle(s) and tendon(s) at lower leg level, unspecified leg, initial encounter: Secondary | ICD-10-CM

## 2014-04-03 DIAGNOSIS — S76119A Strain of unspecified quadriceps muscle, fascia and tendon, initial encounter: Secondary | ICD-10-CM

## 2014-04-03 DIAGNOSIS — C61 Malignant neoplasm of prostate: Secondary | ICD-10-CM

## 2014-04-03 LAB — BASIC METABOLIC PANEL
BUN: 25 mg/dL — AB (ref 6–23)
CHLORIDE: 103 meq/L (ref 96–112)
CO2: 29 mEq/L (ref 19–32)
Calcium: 9.1 mg/dL (ref 8.4–10.5)
Creatinine, Ser: 1.5 mg/dL (ref 0.4–1.5)
GFR: 58.53 mL/min — ABNORMAL LOW (ref >60.00)
Glucose, Bld: 131 mg/dL — ABNORMAL HIGH (ref 70–99)
Potassium: 4.9 mEq/L (ref 3.5–5.1)
Sodium: 140 mEq/L (ref 135–145)

## 2014-04-03 LAB — HEMOGLOBIN A1C: Hgb A1c MFr Bld: 6.8 % — ABNORMAL HIGH (ref 4.6–6.5)

## 2014-04-03 MED ORDER — FUROSEMIDE 20 MG PO TABS: 20.0000 mg | ORAL_TABLET | Freq: Every day | ORAL | Status: DC | PRN

## 2014-04-03 NOTE — Assessment & Plan Note (Signed)
Per Urol

## 2014-04-03 NOTE — Assessment & Plan Note (Signed)
Injured L knee: Left Quadriceps Tendon Repair on 10/30/2013 Sports med ref: he may need a brace

## 2014-04-03 NOTE — Progress Notes (Signed)
   Subjective:     HPI  The patient has been doing well overall without major physical or psychological issues going on lately, except for the prostate ca return - he had a prostate cryo in Dec 2013... PSA went up 2/15 a little. Injured L knee: Left Quadriceps Tendon Repair on 10/30/2013 - limp, can't walk well   The patient needs to address  chronic hypertension that has been well controlled with medicines; to address chronic  hyperlipidemia controlled with medicines as well  BP Readings from Last 3 Encounters:  04/03/14 118/74  11/02/13 140/58  11/02/13 140/58   Wt Readings from Last 3 Encounters:  04/03/14 211 lb (95.709 kg)  10/03/13 213 lb (96.616 kg)  04/03/13 211 lb (95.709 kg)       Review of Systems  Constitutional: Negative for appetite change, fatigue and unexpected weight change.  HENT: Negative for congestion, nosebleeds, sneezing, sore throat and trouble swallowing.   Eyes: Negative for itching and visual disturbance.  Respiratory: Negative for cough.   Cardiovascular: Negative for chest pain, palpitations and leg swelling.  Gastrointestinal: Negative for nausea, diarrhea, blood in stool and abdominal distention.  Genitourinary: Negative for frequency and hematuria.  Musculoskeletal: Negative for back pain, gait problem, joint swelling and neck pain.  Skin: Negative for rash.  Neurological: Negative for dizziness, tremors, speech difficulty and weakness.  Psychiatric/Behavioral: Negative for sleep disturbance, dysphoric mood and agitation. The patient is not nervous/anxious.        Objective:   Physical Exam  Constitutional: He is oriented to person, place, and time. He appears well-developed.  HENT:  Mouth/Throat: Oropharynx is clear and moist.  Eyes: Conjunctivae are normal. Pupils are equal, round, and reactive to light.  Neck: Normal range of motion. No JVD present. No thyromegaly present.  Cardiovascular: Normal rate, regular rhythm, normal heart  sounds and intact distal pulses.  Exam reveals no gallop and no friction rub.   No murmur heard. Pulmonary/Chest: Effort normal and breath sounds normal. No respiratory distress. He has no wheezes. He has no rales. He exhibits no tenderness.  Abdominal: Soft. Bowel sounds are normal. He exhibits no distension and no mass. There is no tenderness. There is no rebound and no guarding.  Musculoskeletal: Normal range of motion. He exhibits no edema and no tenderness.  Lymphadenopathy:    He has no cervical adenopathy.  Neurological: He is alert and oriented to person, place, and time. He has normal reflexes. No cranial nerve deficit. He exhibits normal muscle tone. Coordination normal.  Skin: Skin is warm and dry. No rash noted.  Psychiatric: He has a normal mood and affect. His behavior is normal. Judgment and thought content normal.  L knee is restr w/ROM and w/swelling L ankle 1+ swelling Limping   Lab Results  Component Value Date   WBC 12.4* 10/30/2013   HGB 15.7 10/30/2013   HCT 45.2 10/30/2013   PLT 152 10/30/2013   GLUCOSE 166* 10/30/2013   CHOL 131 10/03/2013   TRIG 148.0 10/03/2013   HDL 32.90* 10/03/2013   LDLCALC 69 10/03/2013   ALT 28 10/30/2013   AST 25 10/30/2013   NA 140 10/30/2013   K 4.3 10/30/2013   CL 103 10/30/2013   CREATININE 1.57* 10/30/2013   BUN 24* 10/30/2013   CO2 26 10/30/2013   TSH 1.99 10/03/2013   PSA 15.38* 09/29/2012   HGBA1C 6.9* 10/03/2013        Assessment & Plan:

## 2014-04-03 NOTE — Assessment & Plan Note (Signed)
Doing well now 

## 2014-04-03 NOTE — Progress Notes (Signed)
Pre visit review using our clinic review tool, if applicable. No additional management support is needed unless otherwise documented below in the visit note. 

## 2014-04-03 NOTE — Assessment & Plan Note (Signed)
Continue with current prescription therapy as reflected on the Med list.  

## 2014-04-03 NOTE — Assessment & Plan Note (Signed)
L>>R leg post-injury ( Injured L knee: Left Quadriceps Tendon Repair on 10/30/2013) Lasix prn Compr sock

## 2014-04-03 NOTE — Assessment & Plan Note (Signed)
labs

## 2014-04-04 ENCOUNTER — Encounter: Payer: Self-pay | Admitting: *Deleted

## 2014-04-15 ENCOUNTER — Ambulatory Visit (INDEPENDENT_AMBULATORY_CARE_PROVIDER_SITE_OTHER): Payer: Medicare Other | Admitting: Family Medicine

## 2014-04-15 ENCOUNTER — Encounter: Payer: Self-pay | Admitting: Family Medicine

## 2014-04-15 ENCOUNTER — Other Ambulatory Visit (INDEPENDENT_AMBULATORY_CARE_PROVIDER_SITE_OTHER): Payer: Medicare Other

## 2014-04-15 VITALS — BP 112/70 | HR 75 | Wt 211.0 lb

## 2014-04-15 DIAGNOSIS — M25562 Pain in left knee: Secondary | ICD-10-CM

## 2014-04-15 DIAGNOSIS — M25569 Pain in unspecified knee: Secondary | ICD-10-CM | POA: Diagnosis not present

## 2014-04-15 DIAGNOSIS — S76119A Strain of unspecified quadriceps muscle, fascia and tendon, initial encounter: Secondary | ICD-10-CM

## 2014-04-15 DIAGNOSIS — S838X9A Sprain of other specified parts of unspecified knee, initial encounter: Secondary | ICD-10-CM | POA: Diagnosis not present

## 2014-04-15 DIAGNOSIS — S86819A Strain of other muscle(s) and tendon(s) at lower leg level, unspecified leg, initial encounter: Secondary | ICD-10-CM

## 2014-04-15 NOTE — Patient Instructions (Signed)
Very nice to meet you Ice 20 minutes 2 times a day especially after activity Wear brace with activity  Exercises 3 times a week Vitamin D 1000 IU daily in addition to your 50000 once monthly.  Come back in 4 weeks to make sure you are better

## 2014-04-15 NOTE — Assessment & Plan Note (Signed)
Patient is still has some weakness of the him on that is likely contributing to his pain. In addition this patient does have calcific changes within the tendon itself. Patient told to increase his vitamin D 2000 mg daily and continue with the vitamin D 50,000 units monthly. Patient will also do a home exercise program that was given to him today we discussed an icing protocol. In addition this patient also given a brace that was fitted by me that he will wear when doing significant activity. Patient and will come back again in 4 weeks for further evaluation. Patient continues have problems we may need to consider formal physical therapy.

## 2014-04-15 NOTE — Progress Notes (Signed)
Edward [name], Edward [name] Phone: [phone] Subjective:    I'm seeing this patient by the request  of:  Walker Kehr, MD   CC: Left knee pain  Edward [name] is a 80 y.o. male coming in with complaint of left knee pain. Patient did have a quadriceps tendon repair done November of 2014 by Dr. Lorin Mercy. Patient states since that time she continues to have difficulty with walking. Patient states it is mostly tightness. Patient is never any true injury. Patient did do all formal physical therapy without any significant setbacks. Patient has tried multiple different things for pain and states that is not as much the pain that gives him trouble as much as tightness after inactivity. Patient is still fairly active doing a lot of outdoor activity. Patient is not doing any of the exercises on a regular basis. Patient has not seen orthopedic surgeon in multiple months.     Past medical history, social, surgical and family history all reviewed in electronic medical record.   Review of Systems: No headache, visual changes, nausea, vomiting, diarrhea, constipation, dizziness, abdominal pain, skin rash, fevers, chills, night sweats, weight loss, swollen lymph nodes, body aches, joint swelling, muscle aches, chest pain, shortness of breath, mood changes.   Objective Blood pressure 112/70, pulse 75, weight 211 lb (95.709 kg), SpO2 95.00%.  General: No apparent distress alert and oriented x3 mood and affect normal, dressed appropriately.  HEENT: Pupils equal, extraocular movements intact  Respiratory: Patient's speak in full sentences and does not appear short of breath  Cardiovascular: No lower extremity edema, non tender, no erythema  Skin: Warm dry intact with no signs of infection or rash on extremities or on axial skeleton.  Abdomen: Soft nontender  Neuro: Cranial nerves II through XII are intact, neurovascularly intact in all  extremities with 2+ DTRs and 2+ pulses.  Lymph: No lymphadenopathy of posterior or anterior cervical chain or axillae bilaterally.  Gait normal with good balance and coordination.  MSK:  Non tender with full range of motion and good stability and symmetric strength and tone of shoulders, elbows, wrist, hip,  and ankles bilaterally.  Knee: Left Healed incision from previous quadriceps tendon repair Palpatio shows mild medial and lateral joint line tenderness with mild warmth compared to the contralateral side and trace effusion. No patellar tenderness, or condyle tenderness. ROM full in flexion and extension and lower leg rotation. Ligaments with solid consistent endpoints including ACL, PCL, LCL, MCL. Negative Mcmurray's, Apley's, and Thessalonian tests. Mild painful patellar compression. Patellar glide mild with crepitus. Patellar and quadriceps tendons unremarkable. Hamstring and quadriceps strength is 4-5 compared to 5 out of 5 on the contralateral side   MSK US performed of: Left knee This study was ordered, performed, and interpreted by Charlann Boxer D.O.  Knee: All structures visualized. Anteromedial, anterolateral, posteromedial, and posterolateral menisci unremarkable without tearing, fraying, effusion, or displacement. Patient though does have narrowing of the joint lines medial and lateral Patellar Tendon unremarkable on long and transverse views without effusion. Quadriceps tendon show that patient does have calcific changes are likely from the suture material. No repeat tear appreciated. No abnormality of prepatellar bursa. LCL and MCL unremarkable on long and transverse views. No abnormality of origin of medial or lateral head of the gastrocnemius.  IMPRESSION:  Calcific changes status post quadricep tendon repair     Impression and Recommendations:     This case required medical decision making of moderate  complexity.

## 2014-04-22 DIAGNOSIS — C61 Malignant neoplasm of prostate: Secondary | ICD-10-CM | POA: Diagnosis not present

## 2014-04-30 DIAGNOSIS — C61 Malignant neoplasm of prostate: Secondary | ICD-10-CM | POA: Diagnosis not present

## 2014-05-01 ENCOUNTER — Other Ambulatory Visit (HOSPITAL_COMMUNITY): Payer: Self-pay | Admitting: Urology

## 2014-05-01 DIAGNOSIS — C61 Malignant neoplasm of prostate: Secondary | ICD-10-CM

## 2014-05-15 DIAGNOSIS — N289 Disorder of kidney and ureter, unspecified: Secondary | ICD-10-CM | POA: Diagnosis not present

## 2014-05-16 ENCOUNTER — Ambulatory Visit (HOSPITAL_COMMUNITY)
Admission: RE | Admit: 2014-05-16 | Discharge: 2014-05-16 | Disposition: A | Discharge status: Home / Self Care | Payer: Medicare Other | Source: Ambulatory Visit | Attending: Urology | Admitting: Urology

## 2014-05-16 DIAGNOSIS — C61 Malignant neoplasm of prostate: Secondary | ICD-10-CM | POA: Diagnosis not present

## 2014-05-16 DIAGNOSIS — M19029 Primary osteoarthritis, unspecified elbow: Secondary | ICD-10-CM | POA: Insufficient documentation

## 2014-05-16 DIAGNOSIS — J323 Chronic sphenoidal sinusitis: Secondary | ICD-10-CM | POA: Insufficient documentation

## 2014-05-16 DIAGNOSIS — J984 Other disorders of lung: Secondary | ICD-10-CM | POA: Diagnosis not present

## 2014-05-16 DIAGNOSIS — I700 Atherosclerosis of aorta: Secondary | ICD-10-CM | POA: Insufficient documentation

## 2014-05-16 DIAGNOSIS — J9819 Other pulmonary collapse: Secondary | ICD-10-CM | POA: Diagnosis not present

## 2014-05-16 DIAGNOSIS — M25469 Effusion, unspecified knee: Secondary | ICD-10-CM | POA: Insufficient documentation

## 2014-05-16 DIAGNOSIS — N200 Calculus of kidney: Secondary | ICD-10-CM | POA: Diagnosis not present

## 2014-05-16 MED ORDER — FLUDEOXYGLUCOSE F - 18 (FDG) INJECTION
10.0000 | Freq: Once | INTRAVENOUS | Status: AC | PRN
  Administered 2014-05-16: 10 via INTRAVENOUS

## 2014-05-17 ENCOUNTER — Other Ambulatory Visit: Payer: Self-pay | Admitting: Nephrology

## 2014-05-17 DIAGNOSIS — N289 Disorder of kidney and ureter, unspecified: Secondary | ICD-10-CM

## 2014-05-17 DIAGNOSIS — Q619 Cystic kidney disease, unspecified: Secondary | ICD-10-CM

## 2014-05-20 ENCOUNTER — Encounter: Payer: Self-pay | Admitting: Family Medicine

## 2014-05-20 ENCOUNTER — Ambulatory Visit (INDEPENDENT_AMBULATORY_CARE_PROVIDER_SITE_OTHER): Payer: Medicare Other | Admitting: Family Medicine

## 2014-05-20 VITALS — BP 124/72 | HR 70 | Ht 73.0 in | Wt 208.0 lb

## 2014-05-20 DIAGNOSIS — S838X9A Sprain of other specified parts of unspecified knee, initial encounter: Secondary | ICD-10-CM

## 2014-05-20 DIAGNOSIS — S86819A Strain of other muscle(s) and tendon(s) at lower leg level, unspecified leg, initial encounter: Secondary | ICD-10-CM

## 2014-05-20 DIAGNOSIS — S76119A Strain of unspecified quadriceps muscle, fascia and tendon, initial encounter: Secondary | ICD-10-CM

## 2014-05-20 NOTE — Progress Notes (Signed)
  [name] [place] [place] [address], [place] [address] Phone: [phone] Subjective:    CC: Left knee pain followup  [id number] Edward [name] is a 80 y.o. male coming in with complaint of left knee pain. Patient was seen previously and was having continued swelling and pain after quadricep tendon repair that occurred in November of 2014. Patient was given home exercise program, bracing, icing protocol as well as vitamin D. Patient states that he is approximately 80% better. Patient is able to do all activities including playing in the yard which is very happy about. Denies any radiation of pain or any new symptoms.     Past medical history, social, surgical and family history all reviewed in electronic medical record.   Review of Systems: No headache, visual changes, nausea, vomiting, diarrhea, constipation, dizziness, abdominal pain, skin rash, fevers, chills, night sweats, weight loss, swollen lymph nodes, body aches, joint swelling, muscle aches, chest pain, shortness of breath, mood changes.   Objective Blood pressure 124/72, pulse 70, height 6\' 1"  (1.854 m), weight 208 lb (94.348 kg), SpO2 95.00%.  General: No apparent distress alert and oriented x3 mood and affect normal, dressed appropriately.  HEENT: Pupils equal, extraocular movements intact  Respiratory: Patient's speak in full sentences and does not appear short of breath  Cardiovascular: No lower extremity edema, non tender, no erythema  Skin: Warm dry intact with no signs of infection or rash on extremities or on axial skeleton.  Abdomen: Soft nontender  Neuro: Cranial nerves II through XII are intact, neurovascularly intact in all extremities with 2+ DTRs and 2+ pulses.  Lymph: No lymphadenopathy of posterior or anterior cervical chain or axillae bilaterally.  Gait normal with good balance and coordination.  MSK:  Non tender with full range of motion and good stability and symmetric  strength and tone of shoulders, elbows, wrist, hip,  and ankles bilaterally.  Knee: Left Healed incision from previous quadriceps tendon repair Palpation reveals no area of tenderness specifically.  No patellar tenderness, or condyle tenderness. ROM full in flexion and extension and lower leg rotation. Ligaments with solid consistent endpoints including ACL, PCL, LCL, MCL. Negative Mcmurray's, Apley's, and Thessalonian tests. Mild painful patellar compression. Patellar glide mild with crepitus. Patellar and quadriceps tendons unremarkable. Hamstring and quadriceps strength is 4-5 compared to 5 out of 5 on the contralateral side   MSK US performed of: Left knee This study was ordered, performed, and interpreted by Charlann Boxer D.O.  Knee: All structures visualized. Anteromedial, anterolateral, posteromedial, and posterolateral menisci unremarkable without tearing, fraying, effusion, or displacement. Patient though does have narrowing of the joint lines medial and lateral Patellar Tendon unremarkable on long and transverse views without effusion. Quadriceps tendon show that patient does have calcific changes are likely from the suture material with mild resorption compared to previous study No repeat tear appreciated. No abnormality of prepatellar bursa. LCL and MCL unremarkable on long and transverse views. No abnormality of origin of medial or lateral head of the gastrocnemius.  IMPRESSION:  Calcific changes status post quadricep tendon repair     Impression and Recommendations:     This case required medical decision making of moderate complexity.

## 2014-05-20 NOTE — Patient Instructions (Signed)
Good to see you Ice at the end of activity Wear compression and brace with a lot of activity.  Continue exercises 3 times a week.  Come back again in 4 weeks if not perfect.

## 2014-05-20 NOTE — Assessment & Plan Note (Signed)
Patient is now 7 months out from surgery and is doing relatively well. Patient will continue the exercises as well as the icing. Patient can continue the bracing with significant amount of activity. Patient will follow up again in 4 weeks for further evaluation. The discussed with patient he likely still have some soreness as well as tightness of his quadriceps tendon will never be quite the same since contralateral side.  Spent greater than 25 minutes with patient face-to-face and had greater than 50% of counseling including as described above in assessment and plan.

## 2014-05-22 DIAGNOSIS — N182 Chronic kidney disease, stage 2 (mild): Secondary | ICD-10-CM | POA: Diagnosis not present

## 2014-05-24 ENCOUNTER — Ambulatory Visit
Admission: RE | Admit: 2014-05-24 | Discharge: 2014-05-24 | Disposition: A | Discharge status: Home / Self Care | Payer: Medicare Other | Source: Ambulatory Visit | Attending: Nephrology | Admitting: Nephrology

## 2014-05-24 DIAGNOSIS — Q619 Cystic kidney disease, unspecified: Secondary | ICD-10-CM

## 2014-05-24 DIAGNOSIS — N281 Cyst of kidney, acquired: Secondary | ICD-10-CM | POA: Diagnosis not present

## 2014-05-24 DIAGNOSIS — N289 Disorder of kidney and ureter, unspecified: Secondary | ICD-10-CM

## 2014-05-24 MED ORDER — IOHEXOL 300 MG/ML  SOLN
80.0000 mL | Freq: Once | INTRAMUSCULAR | Status: AC | PRN
  Administered 2014-05-24: 80 mL via INTRAVENOUS

## 2014-05-29 DIAGNOSIS — C61 Malignant neoplasm of prostate: Secondary | ICD-10-CM | POA: Diagnosis not present

## 2014-05-29 DIAGNOSIS — N281 Cyst of kidney, acquired: Secondary | ICD-10-CM | POA: Diagnosis not present

## 2014-06-26 ENCOUNTER — Ambulatory Visit: Payer: Medicare Other | Admitting: Family Medicine

## 2014-08-02 ENCOUNTER — Encounter: Payer: Self-pay | Admitting: Internal Medicine

## 2014-08-27 DIAGNOSIS — C61 Malignant neoplasm of prostate: Secondary | ICD-10-CM | POA: Diagnosis not present

## 2014-09-05 DIAGNOSIS — C61 Malignant neoplasm of prostate: Secondary | ICD-10-CM | POA: Diagnosis not present

## 2014-10-03 ENCOUNTER — Encounter: Payer: Self-pay | Admitting: Internal Medicine

## 2014-10-03 ENCOUNTER — Other Ambulatory Visit (INDEPENDENT_AMBULATORY_CARE_PROVIDER_SITE_OTHER): Payer: Medicare Other

## 2014-10-03 ENCOUNTER — Ambulatory Visit (INDEPENDENT_AMBULATORY_CARE_PROVIDER_SITE_OTHER): Payer: Medicare Other | Admitting: Internal Medicine

## 2014-10-03 VITALS — BP 110/72 | HR 72 | Temp 97.9°F | Resp 16 | Wt 209.0 lb

## 2014-10-03 DIAGNOSIS — N183 Chronic kidney disease, stage 3 unspecified: Secondary | ICD-10-CM

## 2014-10-03 DIAGNOSIS — C61 Malignant neoplasm of prostate: Secondary | ICD-10-CM

## 2014-10-03 DIAGNOSIS — I10 Essential (primary) hypertension: Secondary | ICD-10-CM

## 2014-10-03 DIAGNOSIS — E119 Type 2 diabetes mellitus without complications: Secondary | ICD-10-CM

## 2014-10-03 DIAGNOSIS — R739 Hyperglycemia, unspecified: Secondary | ICD-10-CM | POA: Diagnosis not present

## 2014-10-03 DIAGNOSIS — S76112S Strain of left quadriceps muscle, fascia and tendon, sequela: Secondary | ICD-10-CM

## 2014-10-03 LAB — HEPATIC FUNCTION PANEL
ALT: 27 U/L (ref 0–53)
AST: 24 U/L (ref 0–37)
Albumin: 3.7 g/dL (ref 3.5–5.2)
Alkaline Phosphatase: 36 U/L — ABNORMAL LOW (ref 39–117)
BILIRUBIN DIRECT: 0.1 mg/dL (ref 0.0–0.3)
BILIRUBIN TOTAL: 0.8 mg/dL (ref 0.2–1.2)
Total Protein: 7.4 g/dL (ref 6.0–8.3)

## 2014-10-03 LAB — BASIC METABOLIC PANEL
BUN: 31 mg/dL — ABNORMAL HIGH (ref 6–23)
CHLORIDE: 101 meq/L (ref 96–112)
CO2: 30 mEq/L (ref 19–32)
Calcium: 8.9 mg/dL (ref 8.4–10.5)
Creatinine, Ser: 1.9 mg/dL — ABNORMAL HIGH (ref 0.4–1.5)
GFR: 44.7 mL/min — ABNORMAL LOW (ref >60.00)
GLUCOSE: 128 mg/dL — AB (ref 70–99)
Potassium: 4.6 mEq/L (ref 3.5–5.1)
Sodium: 139 mEq/L (ref 135–145)

## 2014-10-03 LAB — HEMOGLOBIN A1C: HEMOGLOBIN A1C: 6.6 % — AB (ref 4.6–6.5)

## 2014-10-03 MED ORDER — VITAMIN D (ERGOCALCIFEROL) 1.25 MG (50000 UNIT) PO CAPS: 50000.0000 [IU] | ORAL_CAPSULE | ORAL | Status: DC

## 2014-10-03 MED ORDER — FOLIC ACID 1 MG PO TABS: 1.0000 mg | ORAL_TABLET | Freq: Every day | ORAL | Status: DC

## 2014-10-03 MED ORDER — TRIAMTERENE-HCTZ 37.5-25 MG PO CAPS: 1.0000 | ORAL_CAPSULE | Freq: Every morning | ORAL | Status: DC

## 2014-10-03 MED ORDER — NIACIN ER (ANTIHYPERLIPIDEMIC) 500 MG PO TBCR: 500.0000 mg | EXTENDED_RELEASE_TABLET | Freq: Every day | ORAL | Status: DC

## 2014-10-03 MED ORDER — FUROSEMIDE 20 MG PO TABS: 20.0000 mg | ORAL_TABLET | Freq: Every day | ORAL | Status: DC | PRN

## 2014-10-03 MED ORDER — OLMESARTAN MEDOXOMIL 40 MG PO TABS: 40.0000 mg | ORAL_TABLET | Freq: Every day | ORAL | Status: DC

## 2014-10-03 NOTE — Assessment & Plan Note (Signed)
Labs

## 2014-10-03 NOTE — Progress Notes (Deleted)
Pre visit review using our clinic review tool, if applicable. No additional management support is needed unless otherwise documented below in the visit note. 

## 2014-10-03 NOTE — Progress Notes (Signed)
   Subjective:     HPI  The patient has been doing well overall without major physical or psychological issues going on lately, except for the prostate ca return - he had a prostate cryo in Dec 2013... PSA went up to 5 in 9/15. Injured L knee: Left Quadriceps Tendon Repair on 10/30/2013 - limp, better   The patient needs to address  chronic hypertension that has been well controlled with medicines; to address chronic  hyperlipidemia controlled with medicines as well  BP Readings from Last 3 Encounters:  10/03/14 110/72  05/20/14 124/72  04/15/14 112/70   Wt Readings from Last 3 Encounters:  10/03/14 209 lb (94.802 kg)  05/20/14 208 lb (94.348 kg)  04/15/14 211 lb (95.709 kg)       Review of Systems  Constitutional: Negative for appetite change, fatigue and unexpected weight change.  HENT: Negative for congestion, nosebleeds, sneezing, sore throat and trouble swallowing.   Eyes: Negative for itching and visual disturbance.  Respiratory: Negative for cough.   Cardiovascular: Negative for chest pain, palpitations and leg swelling.  Gastrointestinal: Negative for nausea, diarrhea, blood in stool and abdominal distention.  Genitourinary: Negative for frequency and hematuria.  Musculoskeletal: Negative for back pain, gait problem, joint swelling and neck pain.  Skin: Negative for rash.  Neurological: Negative for dizziness, tremors, speech difficulty and weakness.  Psychiatric/Behavioral: Negative for sleep disturbance, dysphoric mood and agitation. The patient is not nervous/anxious.        Objective:   Physical Exam  Constitutional: He is oriented to person, place, and time. He appears well-developed.  HENT:  Mouth/Throat: Oropharynx is clear and moist.  Eyes: Conjunctivae are normal. Pupils are equal, round, and reactive to light.  Neck: Normal range of motion. No JVD present. No thyromegaly present.  Cardiovascular: Normal rate, regular rhythm, normal heart sounds and  intact distal pulses.  Exam reveals no gallop and no friction rub.   No murmur heard. Pulmonary/Chest: Effort normal and breath sounds normal. No respiratory distress. He has no wheezes. He has no rales. He exhibits no tenderness.  Abdominal: Soft. Bowel sounds are normal. He exhibits no distension and no mass. There is no tenderness. There is no rebound and no guarding.  Musculoskeletal: Normal range of motion. He exhibits no edema and no tenderness.  Lymphadenopathy:    He has no cervical adenopathy.  Neurological: He is alert and oriented to person, place, and time. He has normal reflexes. No cranial nerve deficit. He exhibits normal muscle tone. Coordination normal.  Skin: Skin is warm and dry. No rash noted.  Psychiatric: He has a normal mood and affect. His behavior is normal. Judgment and thought content normal.  L knee is restr w/ROM and w/swelling L ankle 1+ swelling Limping less   Lab Results  Component Value Date   WBC 12.4* 10/30/2013   HGB 15.7 10/30/2013   HCT 45.2 10/30/2013   PLT 152 10/30/2013   GLUCOSE 131* 04/03/2014   CHOL 131 10/03/2013   TRIG 148.0 10/03/2013   HDL 32.90* 10/03/2013   LDLCALC 69 10/03/2013   ALT 28 10/30/2013   AST 25 10/30/2013   NA 140 04/03/2014   K 4.9 04/03/2014   CL 103 04/03/2014   CREATININE 1.5 04/03/2014   BUN 25* 04/03/2014   CO2 29 04/03/2014   TSH 1.99 10/03/2013   PSA 15.38* 09/29/2012   HGBA1C 6.8* 04/03/2014        Assessment & Plan:

## 2014-10-03 NOTE — Assessment & Plan Note (Signed)
1998 10/13 - relapsed -- s/p bx 12/13 Cryo Dr Janice Norrie

## 2014-10-03 NOTE — Assessment & Plan Note (Signed)
Better  

## 2014-10-03 NOTE — Assessment & Plan Note (Signed)
Continue with current prescription therapy as reflected on the Med list.  

## 2014-10-06 IMAGING — CR DG KNEE 1-2V PORT*L*
2 series · 2 of 2 positions shown · non-contrast
Comparison: October 12, 2012.

CLINICAL DATA: Left knee pain status post fall.

EXAM:
PORTABLE LEFT KNEE - 1-2 VIEW

[AP]
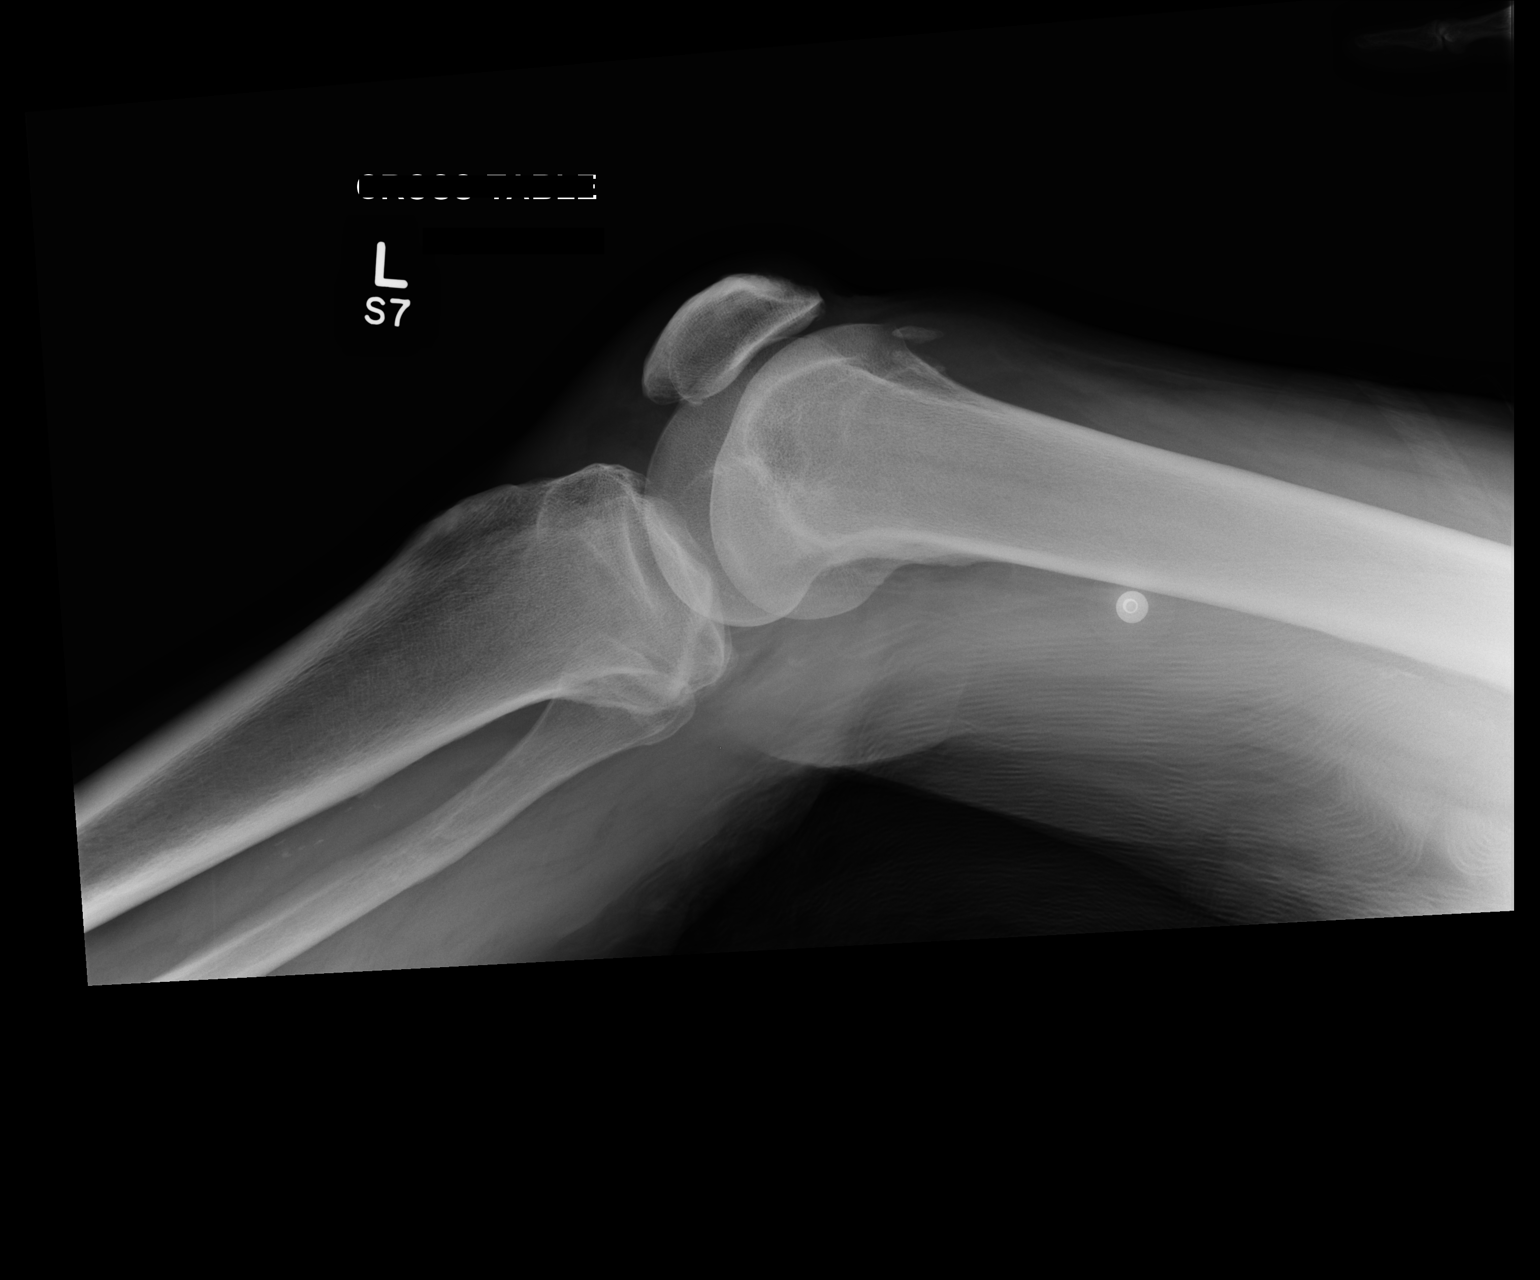

[xtable lateral]
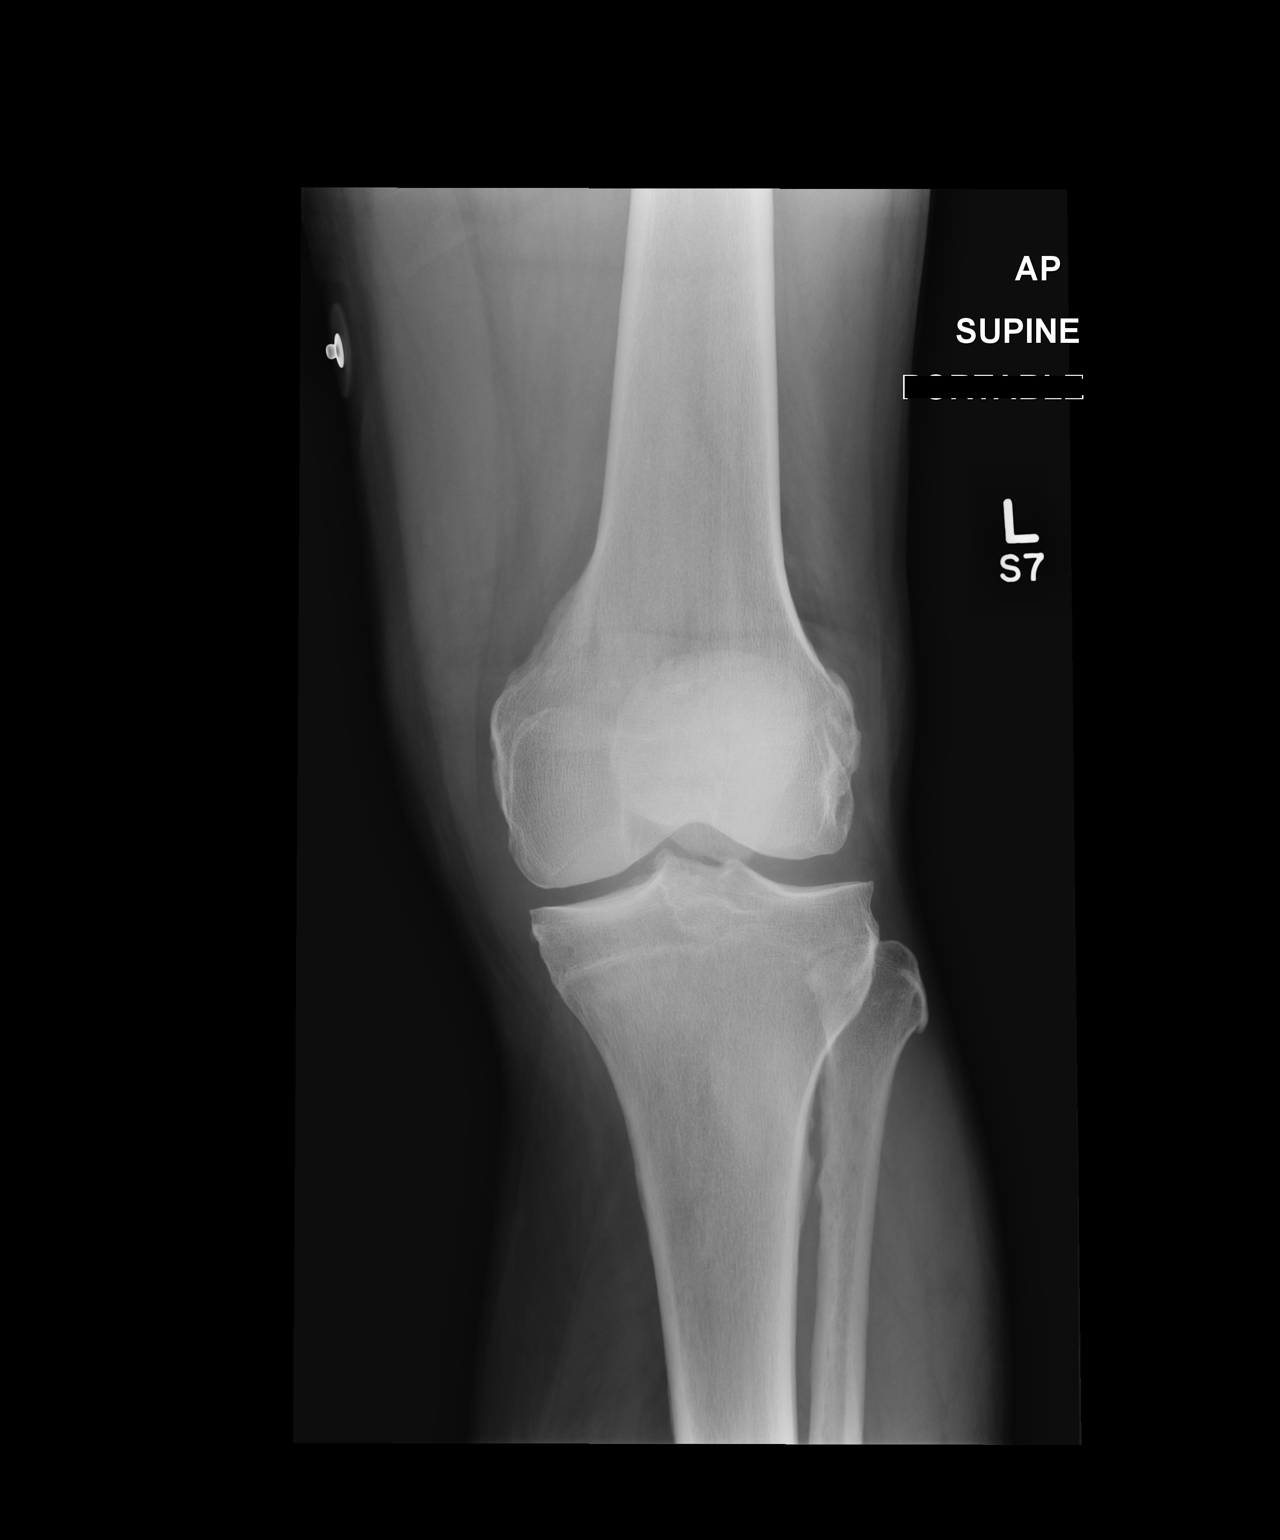

[2 of 2 positions shown; findings below may reference images not displayed]

FINDINGS: An AP and cross-table lateral view of the left knee reveal the bones
to be adequately mineralized. There is a new bony density along the
anterior aspect of the distal femoral metaphysis seen on the lateral
film. It is not clearly evident on the frontal film. This may
reflect avulsion of an area of calcification previously demonstrated
along the anterior superior margin of the patella which is not
evident today. There is no evidence of a femoral condylar fracture
nor of a tibial plateau fracture. The proximal fibula appears
intact. There is no definite joint effusion though there may be mild
soft tissue swelling in the suprapatellar region.
IMPRESSION: 1. Bony density previously demonstrated associated with the superior
margin of the patella is not evident today. However, more proximally
there is calcification demonstrated along the anterior aspect of the
distal femoral metaphysis on the lateral film. There is an
associated overlying soft tissue swelling may reflect quadriceps
tendon injury with avulsion of a area of calcification at the
tendinous insertion along the superior margin of the patella.
2. There is no evidence of a femoral condylar fracture nor of a
tibial plateau or proximal fibular fracture or dislocation.

## 2014-10-07 ENCOUNTER — Other Ambulatory Visit: Payer: Self-pay | Admitting: Internal Medicine

## 2014-12-02 DIAGNOSIS — C61 Malignant neoplasm of prostate: Secondary | ICD-10-CM | POA: Diagnosis not present

## 2014-12-09 ENCOUNTER — Other Ambulatory Visit (HOSPITAL_COMMUNITY): Payer: Self-pay | Admitting: Urology

## 2014-12-09 DIAGNOSIS — C61 Malignant neoplasm of prostate: Secondary | ICD-10-CM | POA: Diagnosis not present

## 2014-12-23 ENCOUNTER — Encounter (HOSPITAL_COMMUNITY)
Admission: RE | Admit: 2014-12-23 | Discharge: 2014-12-23 | Disposition: A | Discharge status: Home / Self Care | Payer: Medicare Other | Source: Ambulatory Visit | Attending: Urology | Admitting: Urology

## 2014-12-23 DIAGNOSIS — C61 Malignant neoplasm of prostate: Secondary | ICD-10-CM | POA: Diagnosis not present

## 2014-12-23 DIAGNOSIS — R948 Abnormal results of function studies of other organs and systems: Secondary | ICD-10-CM | POA: Diagnosis not present

## 2014-12-23 MED ORDER — TECHNETIUM TC 99M MEDRONATE IV KIT
24.4000 | PACK | Freq: Once | INTRAVENOUS | Status: AC | PRN
  Administered 2014-12-23: 24.4 via INTRAVENOUS

## 2015-01-13 DIAGNOSIS — C61 Malignant neoplasm of prostate: Secondary | ICD-10-CM | POA: Diagnosis not present

## 2015-02-25 ENCOUNTER — Telehealth: Payer: Self-pay | Admitting: Medical Oncology

## 2015-02-25 NOTE — Telephone Encounter (Signed)
I called pt to introduce myself as the Prostate Nurse Navigator and the Coordinator of the Prostate [place].  1. I confirmed with the patient he is aware of his referral to the clinic on 03/11/15 to arrive at 12:15pm.  2. I discussed the format of the clinic and the physicians he will be seeing that day.  3. I discussed where the clinic is located and how to contact me. I also asked him to have some lunch before he come due to the length of the clinic.  4. I confirmed his address and informed him I would be mailing a packet of information and forms to be completed. I asked him to bring them with him the day of his appointment.   He voiced understanding of the above. I asked him to call me if he has any questions or concerns regarding his appointments or the forms he needs to complete.    Cira Rue, RN, BSN, [place]  [phone]  Fax [phone]

## 2015-03-09 ENCOUNTER — Encounter: Payer: Self-pay | Admitting: Radiation Oncology

## 2015-03-09 NOTE — Progress Notes (Signed)
GU Location of Tumor / Histology:Addenocarcinoma of the Prostate   Biopsies of (if applicable) revealed: No Reported Biopsy Epic    Edward [name] presented with elevation of his PSA to 6.23 presently. PET Scan in may showed no bony metastases.   In 2013 his Gleason Score was  (4+ 3) and 4+5) IN 2013 and had Cyroblation of the prostate.   Last Note per Dr. Lowella Bandy on 01/13/15 stated that he reviewed treatment options:repeat biopsy and if positive consider repeat salvage cryblation or observation with delayed androgen deprivation.  He questioned whether [name] is a candidate for the Mutidiscoplinary clinic.  .  Past/Anticipated interventions by urology, if any: Consideration repeat salvage cryblation or observation with delayed androgen deprivation.  He questioned whether [name] is a candidate for the Mutidisciplinary clinic.   Past/Anticipated interventions by medical oncology, if any:????  Weight changes, if any: None  Bowel/Bladder complaints, if any:His I PSS score is 2. He has been impotent since his cryotherapy in 2013  Nausea/Vomiting, if any: None  Pain issues, if any:  None  SAFETY ISSUES:  Prior radiation? No  Pacemaker/ICD? No  Possible current pregnancy? N/A  Is the patient on methotrexate? No  Current Complaints / other details:

## 2015-03-10 ENCOUNTER — Telehealth: Payer: Self-pay | Admitting: Medical Oncology

## 2015-03-10 NOTE — Telephone Encounter (Signed)
Called pt to remind him of his appointment tomorrow for the [facility]state [place]. He was not available but his wife stated they would be here at 12:15pm. I reminded her to bring the completed medical forms and to have some lunch before they come due to the length of the clinic. She voiced understanding.     Cira Rue, RN, BSN, [place]  [phone] Fax [phone]

## 2015-03-11 ENCOUNTER — Encounter: Payer: Self-pay | Admitting: Medical Oncology

## 2015-03-11 ENCOUNTER — Encounter: Payer: Self-pay | Admitting: Radiation Oncology

## 2015-03-11 ENCOUNTER — Ambulatory Visit (HOSPITAL_BASED_OUTPATIENT_CLINIC_OR_DEPARTMENT_OTHER): Payer: Medicare Other | Admitting: Oncology

## 2015-03-11 ENCOUNTER — Ambulatory Visit
Admission: RE | Admit: 2015-03-11 | Discharge: 2015-03-11 | Disposition: A | Discharge status: Home / Self Care | Payer: Medicare Other | Source: Ambulatory Visit | Attending: Radiation Oncology | Admitting: Radiation Oncology

## 2015-03-11 VITALS — BP 115/67 | HR 65 | Temp 97.6°F | Ht 73.0 in | Wt 201.6 lb

## 2015-03-11 DIAGNOSIS — C61 Malignant neoplasm of prostate: Secondary | ICD-10-CM | POA: Insufficient documentation

## 2015-03-11 HISTORY — DX: Dorsalgia, unspecified: M54.9

## 2015-03-11 HISTORY — DX: Crushing injury of abdomen, lower back, and pelvis, initial encounter: S38.1XXA

## 2015-03-11 HISTORY — DX: Personal history of other diseases of the musculoskeletal system and connective tissue: Z87.39

## 2015-03-11 HISTORY — DX: Unspecified hearing loss, unspecified ear: H91.90

## 2015-03-11 HISTORY — DX: Unspecified injury of right lower leg, initial encounter: S89.91XA

## 2015-03-11 HISTORY — DX: Presence of dental prosthetic device (complete) (partial): Z97.2

## 2015-03-11 HISTORY — DX: Presence of spectacles and contact lenses: Z97.3

## 2015-03-11 NOTE — Consult Note (Signed)
Chief Complaint  Prostate Cancer   Reason For Visit  Reason for consult: To discuss treatment for biochemically recurrent prostate cancer after primary cryoablation. Physician requesting consult: Dr. Lowella Bandy PCP:  Location of consult: [facility]   History of Present Illness  Edward [name] is a 81 year old who was originally seen by Dr. Risa Grill in 2010 for an elevated PSA and underwent a prostate biopsy on 05/20/09 due to an elevated PSA of 6.24.  This demonstrated 1 out o 12 cores positive for Gleason 3+3=6 adenocarcinoma (20%). After discussing management options, the patient elected to proceed with active surveillance.  He was managed on active surveillance and during this period underwent a prostate biopsy in July 2011 that was negative for malignancy.  His PSA slowly increased to 9.68 in May 2013 and he underwent another biopsy at that time that demonstrated Gleason 4+5=9 adenocarcinoma with 2 out of 13 biopsy cores positive.  Staging studies in October 2013 were performed including a bone scan and CT scan and these were negative for metastatic disease.  The patient was counseled and recommended to undergo treatment.  After considering his options, he elected primary cryotherapy and this was performed under the care of Dr. Janice Norrie in December 2013. He had an initial decrease in his PSA to 0.79 in March 2014.  However, his PSA has gradually increased rising to 2.08 in September 2014, 3.27 in February 2015, 3.96 in May 2015, 5.63 in September 2015, and 6.23 in December 2015.  He did have a sodium fluoride PET performed in May 2015 that was negative for metastatic disease.  He had a technetium bone scan in January 2016 that was also negative for metastatic disease.  He presents today for recommendations from the multidisciplinary clinic and to discuss his treatment options.  ** His PMH is significant only for hypertension. His mother lived to be  45.  He is asymptomatic and remains very active working outside in his yard and enjoying a very good quality of life.  He and his wife are interested in optimizing his quantity of life.   Past Medical History  1. History of hypertension (Z86.79)  Surgical History  1. History of Surgery Prostate Cryosurgical Ablation  Current Meds  1. Aspir-81 81 MG Oral Tablet Delayed Release; TAKE 1 TABLET DAILY;  Therapy: (Recorded:08May2012) to Recorded  2. Benicar 40 MG Oral Tablet; 1 per day;  Therapy: 03Oct2011 to Recorded  3. Folic Acid CAPS; 1mg ; 1 per day;  Therapy: (Recorded:08May2012) to Recorded  4. Furosemide 20 MG Oral Tablet;  Therapy: 28Apr2015 to Recorded  5. Multi-Day Vitamins Oral Tablet; TAKE 1 TABLET DAILY;  Therapy: (Recorded:08May2012) to Recorded  6. Niaspan 500 MG Oral Tablet Extended Release; 1 per day;  Therapy: (Recorded:08May2012) to Recorded  7. Triamterene-HCTZ 37.5-25 MG Oral Capsule; TAKE 1 CAPSULE DAILY;  Therapy: (Recorded:08May2012) to Recorded  8. Vitamin D (Ergocalciferol) 50000 UNIT Oral Capsule; TAKE 1 CAPULE ONCE MONTHLY;  Therapy: 28Oct2010 to Recorded  9. Vitamin D TABS;  Therapy: (Recorded:12May2015) to Recorded  Allergies  1. No Known Drug Allergies  Family History  1. Family history of Family Health Status Number Of Children  2. Family history of Nephrolithiasis : Mother  3. Family history of Nephrolithiasis : Brother  4. Family history of Tuberculosis : Father  Social History   Marital History - Currently Married   Never A Smoker   Occupation:   Denied: Tobacco Use  Review of Systems AU Complete-Male:  Genitourinary, constitutional, skin, eye, otolaryngeal, hematologic/lymphatic, cardiovascular, pulmonary, endocrine, musculoskeletal, gastrointestinal, neurological and psychiatric system(s) were reviewed and pertinent findings if present are noted and are otherwise negative.    Physical Exam Constitutional: Well nourished and well  developed . No acute distress.    Results/Data  I have reviewed his medical records, PSA results, imaging studies and pathology slides in [facility] conference today.     Assessment  1. Prostate cancer (C61)  Discussion/Summary  1. Prostate cancer: I had a detailed discussion with Mr. Gascoigne and his wife today.  He is certainly an exception with regard to his health at age 62.  Considering his minimal medical comorbidities and the longevity in his family, his life expectancy may very well be 10-15 years or longer.  Considering this fact, we discussed proceeding with further evaluation to exclude metastatic disease with pelvic imaging and, if negative, to consider a prostate biopsy to see if he has evidence of locally recurrent disease.  If so, and assuming his PSADT is not too short, he can be considered for salvage local therapy with either repeat cryoablation or radiation therapy. He is interested in salvage radiation therapy after discussion with Dr. Valere Dross today.  He does understand that if he does not have evidence of locally recurrent disease, that he would be best served by ongoing surveillance with systemic ADT should his PSADT shorten or if he develops measurable metastatic disease.  cc: Dr. Arloa Koh Dr. Zola Button Dr. Lowella Bandy   A total of 35 minutes were spent in the overall care of the patient today with 35 minutes in direct face to face consultation.    Signatures Electronically signed by : Raynelle Bring, M.D.; Mar 11 2015 10:34PM EST Electronically signed by : Raynelle Bring, M.D.; Mar 11 2015 10:35PM EST

## 2015-03-11 NOTE — Progress Notes (Signed)
[facility] NEW PATIENT EVALUATION  Name: Edward [name] MRN: [id number]  Date:   03/11/2015           DOB: 26-Oct-1933  Status: outpatient   CC: Walker Kehr, MD  Raynelle Bring, MD  Dr. Lowella Bandy   REFERRING PHYSICIAN: Raynelle Bring, MD   DIAGNOSIS: Clinical stage T1c high risk adenocarcinoma prostate.  HISTORY OF PRESENT ILLNESS:  Edward [name] is a 81 y.o. male who is seen today at the prostate multidisciplinary clinic through the courtesy of Dr. Alinda Money of for discussion of radiation therapy in the management of his clinical stage T1c high risk adenocarcinoma prostate.  He presented in 2010 with an elevated PSA of 6.24.  Prostate biopsies at that time revealed low volume Gleason 6 (3+3).  He elected for close surveillance.  His first follow-up biopsy in 2011 was benign and at the time reassuring.  Follow-up biopsies in December 2013 revealed the both Gleason 9 (4+5) and also Gleason 7 (4+3) disease, both from the left gland.  He elected for cryotherapy.  His PSA dropped to 0.79 by 2014.  Since then it has been trending upward, and most recently was 6.23 on 12/02/2014.  He is doing reasonably well from a GU and GI standpoint.  His I PSS score is 2.  He has been impotent since his cryotherapy in 2013.  His staging workup included a negative bone scan on 12/23/2014 and negative CT scan of the abdomen and pelvis on 05/24/2014.  He was seen to have a complex cyst involving the upper pole of the right kidney.  PREVIOUS RADIATION THERAPY: No   PAST MEDICAL HISTORY:  has a past medical history of Hyperlipidemia; Hypertension; Elevated glucose; Venous insufficiency of leg; Drug-induced low platelet count; Prostate cancer; Complex renal cyst (RIGHT--  MONITORED BY DR Risa Grill); Chronic renal insufficiency; Nocturia; Right knee injury; Crush injury, back; Back pain; Wears glasses; Hearing loss; History of swelling of feet; and Wears dentures.     PAST SURGICAL  HISTORY:  Past Surgical History  Procedure Laterality Date  . Cryoablation  12/01/2012    Procedure: CRYO ABLATION PROSTATE;  Surgeon: Hanley Ben, MD;  Location: [facility];  Service: Urology;  Laterality: N/A;  . Quadriceps tendon repair Left 10/31/2013    Procedure: Left Quadriceps Tendon Repair;  Surgeon: Marybelle Killings, MD;  Location: [place];  Service: Orthopedics;  Laterality: Left;  Left Quadriceps Tendon Repair  . Repair knee ligament Left 2014  . Colonoscopy  2/77/11     FAMILY HISTORY: family history includes Dementia in his mother; Hypertension in his father; Nephrolithiasis in his brother and mother; Prostate cancer in his brother; Tuberculosis in his father.  His father died of TB at 27.  His mother died from congestive heart failure 27.  Family history positive for prostate cancer in one of his 3 brothers.  He apparently underwent seed implantation in his 54s.   SOCIAL HISTORY:  reports that he has never smoked. He has never used smokeless tobacco. He reports that he does not drink alcohol or use illicit drugs.  Married, 2 children.  Retired Artist.   ALLERGIES: Naproxen sodium   MEDICATIONS:  Current Outpatient Prescriptions  Medication Sig Dispense Refill  . aspirin 81 MG chewable tablet Chew 81 mg by mouth daily.    . Cholecalciferol (VITAMIN D-3) 1000 UNITS CAPS Take 1 capsule by mouth every morning.    . folic acid (FOLVITE) 1 MG tablet Take 1  tablet (1 mg total) by mouth daily. 90 tablet 3  . furosemide (LASIX) 20 MG tablet Take 1-2 tablets (20-40 mg total) by mouth daily as needed for edema. 90 tablet 3  . Multiple Vitamins-Minerals (CENTRUM SILVER) tablet Take 1 tablet by mouth daily.     . niacin (NIASPAN) 500 MG CR tablet Take 1 tablet (500 mg total) by mouth daily. 90 tablet 3  . olmesartan (BENICAR) 40 MG tablet Take 1 tablet (40 mg total) by mouth daily. 90 tablet 3  . triamterene-hydrochlorothiazide (DYAZIDE) 37.5-25 MG per  capsule Take 1 each (1 capsule total) by mouth every morning. 90 capsule 3  . Vitamin D, Ergocalciferol, (DRISDOL) 50000 UNITS CAPS capsule Take 1 capsule (50,000 Units total) by mouth every 30 (thirty) days. First of the month 3 capsule 3   No current facility-administered medications for this encounter.     REVIEW OF SYSTEMS:  Pertinent items are noted in HPI.    PHYSICAL EXAM:  height is 6\' 1"  (1.854 m) and weight is 201 lb 9.6 oz (91.445 kg). His temperature is 97.6 F (36.4 C). His blood pressure is 115/67 and his pulse is 65.   Alert and oriented.  Rectal examination: The prostate gland is small and is flat without nodularity.  There is no palpable periprostatic tumor extension.   LABORATORY DATA:  Lab Results  Component Value Date   WBC 12.4* 10/30/2013   HGB 15.7 10/30/2013   HCT 45.2 10/30/2013   MCV 93.0 10/30/2013   PLT 152 10/30/2013   Lab Results  Component Value Date   NA 139 10/03/2014   K 4.6 10/03/2014   CL 101 10/03/2014   CO2 30 10/03/2014   Lab Results  Component Value Date   ALT 27 10/03/2014   AST 24 10/03/2014   ALKPHOS 36* 10/03/2014   BILITOT 0.8 10/03/2014   PSA 6.23 from 12/02/2014   IMPRESSION: Stage T1c high risk adenocarcinoma the prostate.  We feel that he should be restaged with a CT scan of the abdomen/pelvis and also repeat his PSA.  He has a history of high-risk disease based on his Gleason score of 9.  If his staging workup is negative then we feel that he should undergo repeat prostate biopsies.  If biopsies are positive then he could consider further cryotherapy or external beam/IMRT with consideration of androgen deprivation therapy.  We discussed the potential acute and late toxicities of radiation therapy and also androgen deprivation therapy.  We also discussed the placement of gold 3 markers for image guidance.  Once he has completed his staging workup, I would be more than happy see him for a follow-up visit upon referral from Dr.  Janice Norrie if he is interested in radiation therapy rather than cryotherapy. PLAN:  As discussed above.  I spent 45  minutes face to face with the patient and more than 50% of that time was spent in counseling and/or coordination of care.

## 2015-03-11 NOTE — Progress Notes (Signed)
Please see consult note.  

## 2015-03-11 NOTE — Progress Notes (Unsigned)
                               Care Plan Summary  Name: Edward [name] DOB: 20-Feb-1933   Your Medical Team:   Urologist -  Dr. Raynelle Bring, [facility]  Radiation Oncologist - Dr. Arloa Koh, [name]   Medical Oncologist - Dr. Zola Button, [place]  Recommendations: 1) CT scan and PSA 2) if results ok repeat biopsy  3) if show cancer receive radiation  * These recommendations are based on information available as of today's consult.      Recommendations may change depending on the results of further tests or exams.  Next Steps: 1) Dr. Sammie Bench office will call you to schedule CT and PSA   When appointments need to be scheduled, you will be contacted by [facility] and/or [facility].  Questions?  Please do not hesitate to call Cira Rue, RN, BSN, CRNI at [phone] any questions or concerns.  Shirlean Mylar is your Oncology Nurse Navigator and is available to assist you while you're receiving your medical care at [facility].

## 2015-03-11 NOTE — Consult Note (Signed)
Reason for Referral: Prostate cancer.   HPI: 81 year old gentleman who is in excellent health and shape with history of mild hypertension but for the most part no other comorbid conditions. He was found to have an elevated PSA dating back to 2010 and at that time he had a Gleason score of 3+3 = 6 prostate cancer with a PSA of 6.24. He underwent active surveillance to December 2013 and at that time his biopsy showed a Gleason score 4+5 = 9 and an elevated PSA of 9.68. At that time he underwent cryoablation with an excellent PSA nadir down to 0.79. Most recently his PSA was up to 6.11 December 2014. He is otherwise asymptomatic from a urinary standpoint. He does not report any frequency, urgency, hesitancy, hematuria or nocturia. He does not report any back pain or shoulder pain or abdominal pain. His appetite is excellent is excellent performance status. The remaining review of systems unremarkable. He was referred today for the prostate cancer multidisciplinary clinic for a discussion regarding treatment options.   Past Medical History  Diagnosis Date  . Hyperlipidemia   . Hypertension   . Elevated glucose   . Venous insufficiency of leg   . Drug-induced low platelet count     from NSAIDS  . Prostate cancer   . Complex renal cyst RIGHT--  MONITORED BY DR Risa Grill  . Chronic renal insufficiency     DR WEBB  . Nocturia   . Right knee injury     In past  . Crush injury, back   . Back pain     Difficulty walking- left leg pain  . Wears glasses   . Hearing loss   . History of swelling of feet   . Wears dentures     partial  :  Past Surgical History  Procedure Laterality Date  . Cryoablation  12/01/2012    Procedure: CRYO ABLATION PROSTATE;  Surgeon: Hanley Ben, MD;  Location: [facility];  Service: Urology;  Laterality: N/A;  . Quadriceps tendon repair Left 10/31/2013    Procedure: Left Quadriceps Tendon Repair;  Surgeon: Marybelle Killings, MD;  Location: [place];  Service:  Orthopedics;  Laterality: Left;  Left Quadriceps Tendon Repair  . Repair knee ligament Left 2014  . Colonoscopy  2/77/11  :   Current outpatient prescriptions:  .  aspirin 81 MG chewable tablet, Chew 81 mg by mouth daily., Disp: , Rfl:  .  Cholecalciferol (VITAMIN D-3) 1000 UNITS CAPS, Take 1 capsule by mouth every morning., Disp: , Rfl:  .  folic acid (FOLVITE) 1 MG tablet, Take 1 tablet (1 mg total) by mouth daily., Disp: 90 tablet, Rfl: 3 .  furosemide (LASIX) 20 MG tablet, Take 1-2 tablets (20-40 mg total) by mouth daily as needed for edema., Disp: 90 tablet, Rfl: 3 .  Multiple Vitamins-Minerals (CENTRUM SILVER) tablet, Take 1 tablet by mouth daily. , Disp: , Rfl:  .  niacin (NIASPAN) 500 MG CR tablet, Take 1 tablet (500 mg total) by mouth daily., Disp: 90 tablet, Rfl: 3 .  olmesartan (BENICAR) 40 MG tablet, Take 1 tablet (40 mg total) by mouth daily., Disp: 90 tablet, Rfl: 3 .  triamterene-hydrochlorothiazide (DYAZIDE) 37.5-25 MG per capsule, Take 1 each (1 capsule total) by mouth every morning., Disp: 90 capsule, Rfl: 3 .  Vitamin D, Ergocalciferol, (DRISDOL) 50000 UNITS CAPS capsule, Take 1 capsule (50,000 Units total) by mouth every 30 (thirty) days. First of the month, Disp: 3 capsule, Rfl: 3:  Allergies  Allergen Reactions  . Naproxen Sodium     REACTION: low plt  :  Family History  Problem Relation Age of Onset  . Dementia Mother   . Tuberculosis Father   . Hypertension Father   . Nephrolithiasis Mother   . Nephrolithiasis Brother   . Prostate cancer Brother   :  History   Social History  . Marital Status: Married    Spouse Name: N/A  . Number of Children: 2  . Years of Education: N/A   Occupational History  . retired     Office manager   Social History Main Topics  . Smoking status: Never Smoker   . Smokeless tobacco: Never Used  . Alcohol Use: No  . Drug Use: No  . Sexual Activity: Yes   Other Topics Concern  . Not on file   Social History  Narrative  :  Pertinent items are noted in HPI.  Exam: ECOG 0 There were no vitals taken for this visit. General appearance: alert and cooperative Head: Normocephalic, without obvious abnormality Neck: no adenopathy Back: negative Resp: clear to auscultation bilaterally Chest wall: no tenderness Cardio: regular rate and rhythm, S1, S2 normal, no murmur, click, rub or gallop GI: soft, non-tender; bowel sounds normal; no masses,  no organomegaly Pulses: 2+ and symmetric Skin: Skin color, texture, turgor normal. No rashes or lesions Lymph nodes: Cervical, supraclavicular, and axillary nodes normal. Neurologic: Grossly normal   Assessment and Plan:    81 year old gentleman with diagnosis of prostate cancer dates back to 2010. At that time he had a Gleason score 3+3 = 6 and a PSA of 6.24. After a period of observation, he underwent cryoablation in 2013 for a Gleason score 4+5 = 9 prostate cancer. His most recent PSA was up to 6.23. His case was discussed today in the prostate cancer multidisciplinary clinic including previous imaging studies as well as multiple biopsies. Recommendation at this time is to restage him with a CT scan of the abdomen and pelvis as well as recommended repeat biopsy. He is currently desiring definitive curative treatment and he would be a reasonable candidate for radiation therapy with androgen deprivation. If he does have metastatic disease then androgen deprivation only would be the way to go. I discussed the rationale associated with androgen deprivation as well as the duration in different settings. Complications include fatigue, tiredness, weight gain, hot flashes among others were discussed. He is motivated to proceed with definitive treatment at this time I think he'll be a reasonable candidate for it if he does not have advanced disease.

## 2015-03-11 NOTE — Addendum Note (Signed)
Encounter addended by: Raynelle Bring, MD on: 03/11/2015 10:42 PM<BR>     Documentation filed: Clinical Notes

## 2015-03-20 DIAGNOSIS — C61 Malignant neoplasm of prostate: Secondary | ICD-10-CM | POA: Diagnosis not present

## 2015-03-26 DIAGNOSIS — C61 Malignant neoplasm of prostate: Secondary | ICD-10-CM | POA: Diagnosis not present

## 2015-04-01 DIAGNOSIS — C61 Malignant neoplasm of prostate: Secondary | ICD-10-CM | POA: Diagnosis not present

## 2015-04-07 ENCOUNTER — Encounter: Payer: Medicare Other | Admitting: Internal Medicine

## 2015-04-08 ENCOUNTER — Telehealth: Payer: Self-pay | Admitting: *Deleted

## 2015-04-08 ENCOUNTER — Ambulatory Visit
Admission: RE | Admit: 2015-04-08 | Discharge: 2015-04-08 | Disposition: A | Discharge status: Home / Self Care | Payer: Medicare Other | Source: Ambulatory Visit | Attending: Radiation Oncology | Admitting: Radiation Oncology

## 2015-04-08 VITALS — BP 122/65 | HR 79 | Temp 98.0°F | Wt 210.1 lb

## 2015-04-08 DIAGNOSIS — C61 Malignant neoplasm of prostate: Secondary | ICD-10-CM

## 2015-04-08 NOTE — Addendum Note (Signed)
Encounter addended by: Benn Moulder, RN on: 04/08/2015  5:31 PM<BR>     Documentation filed: Charges VN

## 2015-04-08 NOTE — Addendum Note (Signed)
Encounter addended by: Benn Moulder, RN on: 04/08/2015  3:28 PM<BR>     Documentation filed: Charges VN

## 2015-04-08 NOTE — Progress Notes (Signed)
Please see the Nurse Progress Note in the MD Initial Consult Encounter for this patient. 

## 2015-04-08 NOTE — Telephone Encounter (Signed)
Called patient to inform of gold seed placement on 05/22/15 - arrival time - 11 am @ Dr. Janice Norrie' Office and his [facility] visit on 06/10/15 @ 9 am with Dr. Valere Dross, lvm for a return call

## 2015-04-08 NOTE — Progress Notes (Signed)
CC: Dr. Lowella Bandy, Dr. Cristie Hem Plotnikov  Follow-up note:  Diagnosis: Clinical stage TIc high-risk adenocarcinoma prostate  History: Edward [name] is a pleasant 81 year old male who is seen today for review and discussion of radiation therapy in the management of Edward stage TIc high-risk adenocarcinoma prostate.  I saw him at the multidisciplinary prostate clinic on 03/11/2015. He presented in 2010 with an elevated PSA of 6.24. Prostate biopsies at that time revealed low volume Gleason 6 (3+3). He elected for close surveillance. Edward first follow-up biopsy in 2011 was benign and at the time reassuring. Follow-up biopsies in December 2013 revealed the both Gleason 9 (4+5) and also Gleason 7 (4+3) disease, both from the left gland. He elected for cryotherapy. Edward PSA dropped to 0.79 by 2014. Since then it has been trending upward, and most recently was 6.23 on 12/02/2014. He is doing reasonably well from a GU and GI standpoint. Edward I PSS score is 2. He has been impotent since Edward cryotherapy in 2013. Edward staging workup included a negative bone scan on 12/23/2014 and negative CT scan of the abdomen and pelvis on 05/24/2014.We felt that he should be restaged with a more recent CT scan, repeat Edward PSA, and undergo repeat biopsies.  Edward repeat PSA on 03/20/2015 was 6.76.  Edward CT scan of the pelvis on 03/20/2015 was without evidence for metastatic disease.  Repeat biopsies by Dr. Janice Norrie on 04/01/2015 showed Gleason 8 (4+3) involving 20% of one core from the right mid gland and 20% of one core from right lateral apex.  He also had Gleason 7 (4+3) involving 20% of one core from the left lateral apex and 25% of one core from left apex.  Edward gland volume was 24.6 mL.  He is interested in androgen deprivation therapy along with external beam/IMRT.  He is doing well from a GU and GI standpoint.  He's had erectile dysfunction since Edward cryotherapy.  Physical examination: Alert and oriented 81 year old male appearing  younger than Edward stated age. Filed Vitals:   04/08/15 0822  BP: 122/65  Pulse: 79  Temp: 98 F (36.7 C)   Rectal examination not repeated today.  Edward examination on 03/11/2015 was essentially normal and without focal induration or nodularity.  Laboratory data: Repeat PSA on 03/20/2015 was 6.76.  Impression: Clinical stage TIc high-risk adenocarcinoma prostate.  We again discussed management options.  After lengthy discussion he is most interested in radiation therapy/IMRT along with androgen deprivation therapy.  We discussed the potential acute and late toxicities of radiation therapy and also the side effects and toxicities of androgen deprivation therapy.  We discussed bladder filling to minimize urinary toxicity.  Consent is signed today.  We will get him started on androgen deprivation therapy through Dr. Janice Norrie.  We will kindly ask Dr. Janice Norrie to placed 3 gold seed markers for image guidance.  I will then see the patient back for a follow-up visit in approximately 2 months at which time we will discuss radiation therapy planning and get him scheduled for Edward CT simulation and treatment.  Plan: As above.  30 minutes was spent face-to-face with the patient and Edward [name], primarily counseling patient and coordinating Edward care.

## 2015-04-11 DIAGNOSIS — C61 Malignant neoplasm of prostate: Secondary | ICD-10-CM | POA: Diagnosis not present

## 2015-04-30 IMAGING — CT CT ABD-PELV W/ CM
3 of 5 series · 12 of 36 positions shown, 18 images · IV contrast (READICAT/WATER & 80CC OMNI 300)
Comparison: PET-CT 05/16/2014.  MRI of the abdomen 05/10/2012.

CLINICAL DATA: Evaluate renal cysts. Weight loss. History prostate
cancer.

EXAM:
CT ABDOMEN AND PELVIS WITH CONTRAST
TECHNIQUE: Multidetector CT imaging of the abdomen and pelvis was performed
using the standard protocol following bolus administration of
intravenous contrast.
CONTRAST:  80mL OMNIPAQUE IOHEXOL 300 MG/ML  SOLN

[Series 3: abd/pelvis with · axial · 0.78mm/px · z∈[-375,+5]mm · 8 of 98 slices shown, 13 images]
[im 11/98  soft-tissue]
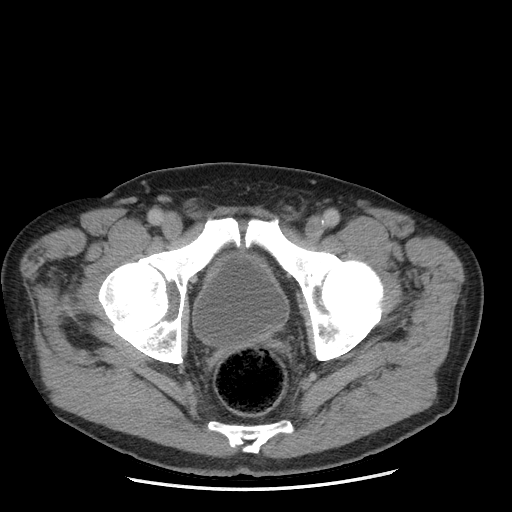
[im 11/98  bone]
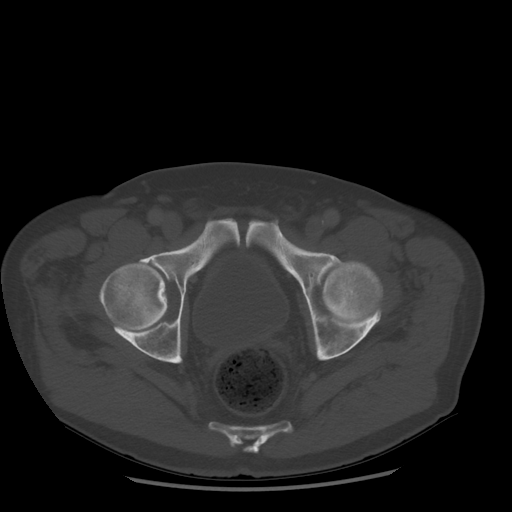
[im 22/98  soft-tissue]
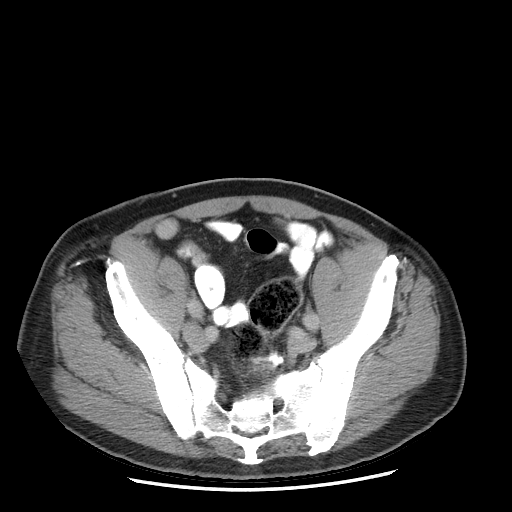
[im 33/98  soft-tissue]
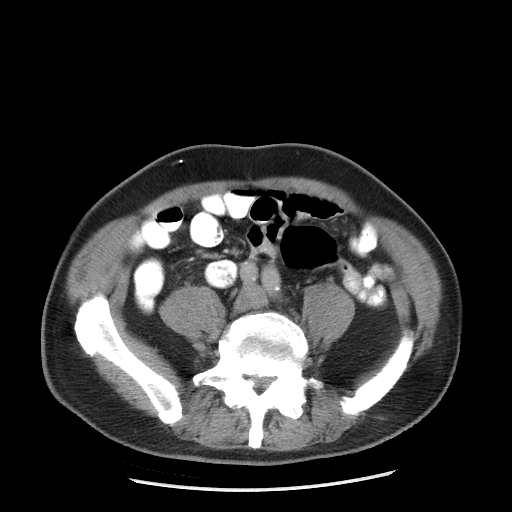
[im 44/98  soft-tissue]
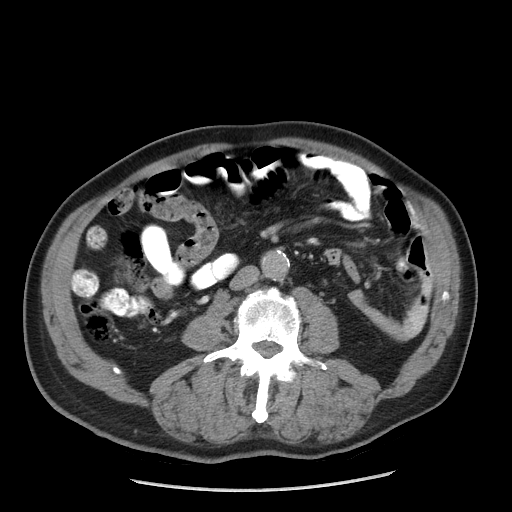
[im 54/98  soft-tissue]
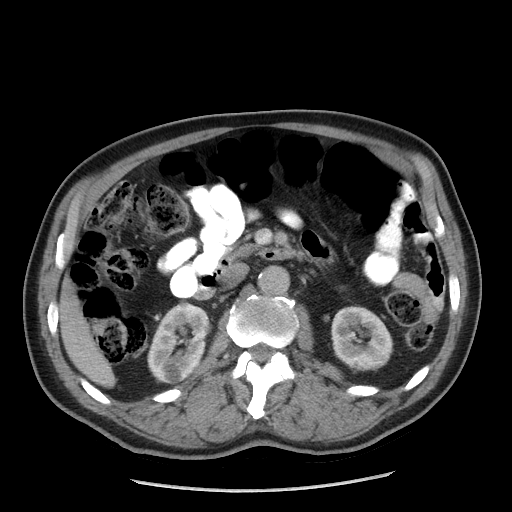
[im 54/98  lung]
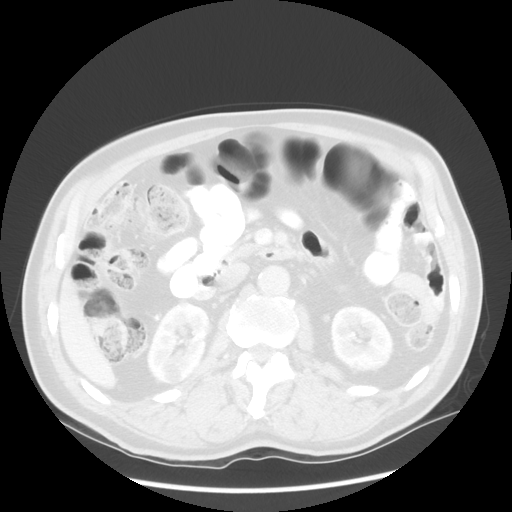
[im 65/98  soft-tissue]
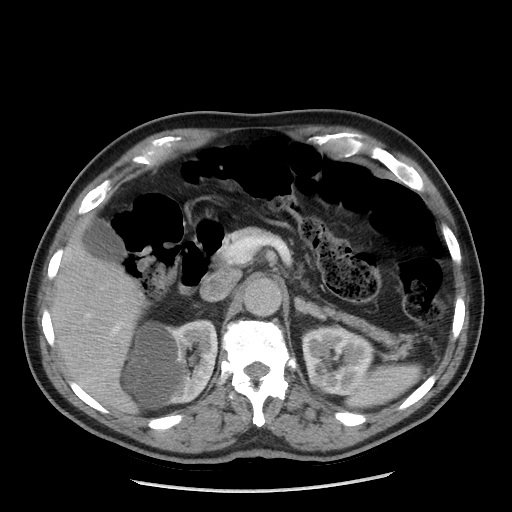
[im 65/98  lung]
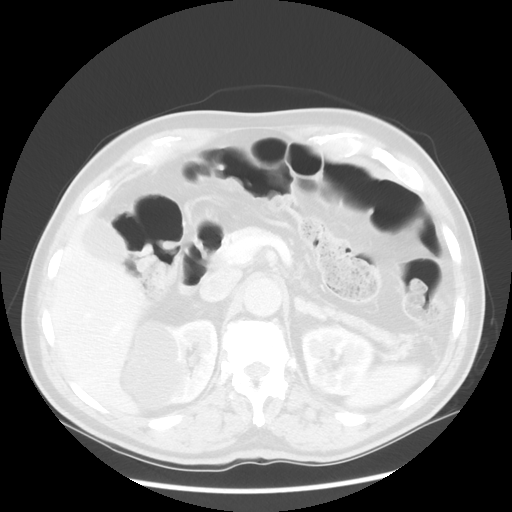
[im 76/98  soft-tissue]
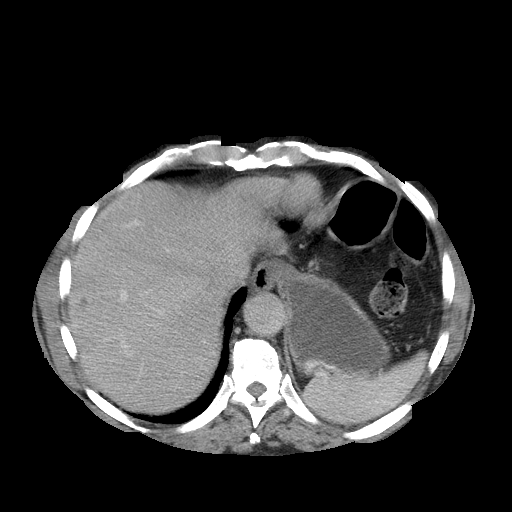
[im 76/98  lung]
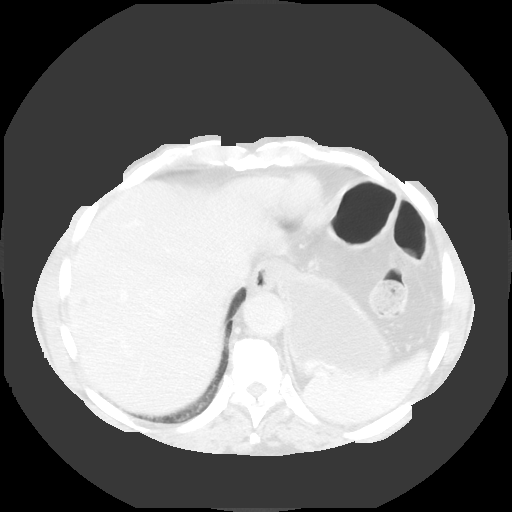
[im 87/98  soft-tissue]
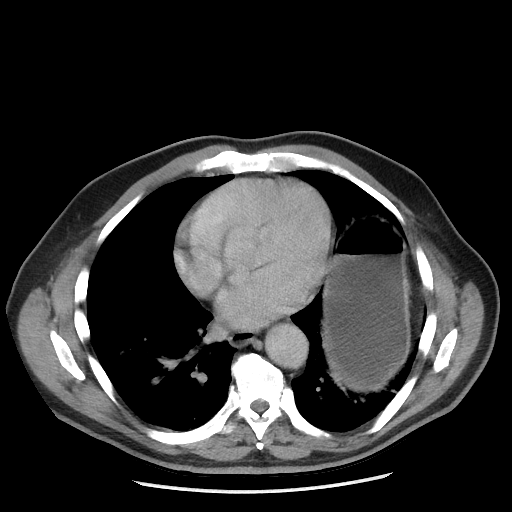
[im 87/98  lung]
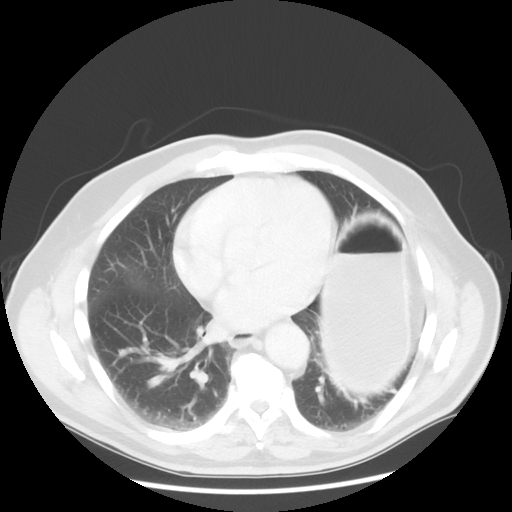

[Series 601: coronal body · coronal · 0.96mm/px · 1 of 122 slices shown, 2 images]
[im 41/122  soft-tissue]
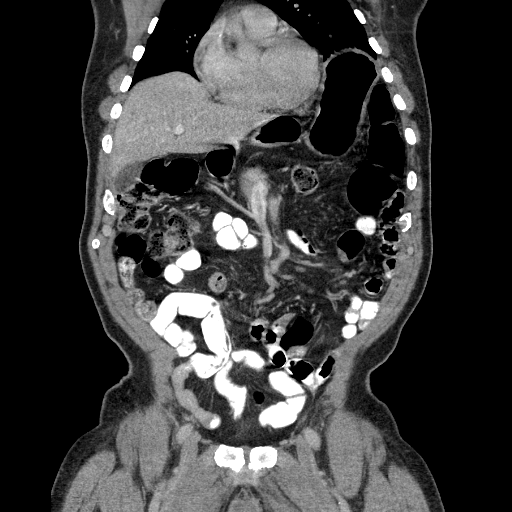
[im 41/122  bone]
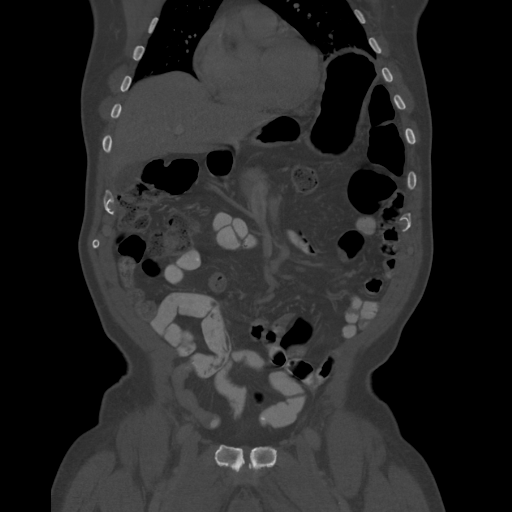

[Series 602: sagittal body · sagittal · 0.96mm/px · 3 of 161 slices shown]
[im 11/161  soft-tissue]
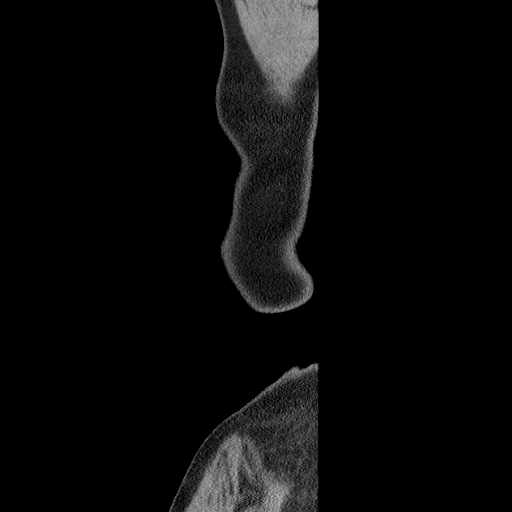
[im 33/161  soft-tissue]
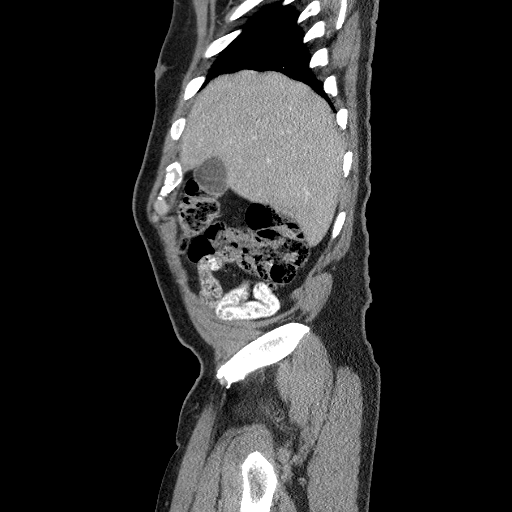
[im 54/161  soft-tissue]
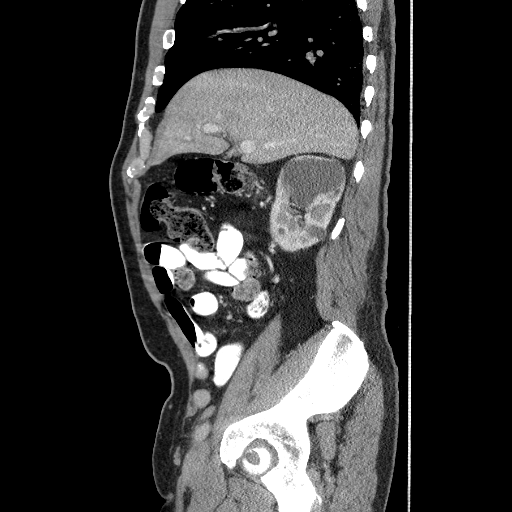

[12 of 36 positions shown; findings below may reference images not displayed]

FINDINGS: Lung Bases: Elevation of the left hemidiaphragm is unchanged.
Patulous esophagus. Atherosclerotic calcifications in the right
coronary artery.

Abdomen/Pelvis: Again noted is a large complex cystic lesion with
multiple thin internal septations and multiple loculations in the
upper pole of the right kidney which currently measures up to 7.1 x
5.3 cm (image 35 of series 3). This has grown slightly compared to
prior study from 05/10/2012, at which point it measured
approximately 6.2 x 5.1 cm. There is also a 2.3 x 2.7 cm
low-attenuation lesion in the medial aspect of the upper pole of the
left kidney which appears simple on today's study, low of was
previously demonstrated to have internal thin septations on prior
MRI. This finding is essentially unchanged. Several other smaller
low-attenuation lesions are noted throughout the kidneys
bilaterally, which are too small to definitively characterize, but
statistically favored to represent tiny cysts, also similar to prior
examinations.

Several low-attenuation lesions in the liver remain too small to
characterize, but are similar to the prior MRI, at which point they
were compatible with small cysts. The appearance of the gallbladder,
pancreas, spleen and bilateral adrenal glands is unremarkable.
Extensive atherosclerosis throughout the abdominal and pelvic
vasculature, without evidence of aneurysm. No significant volume of
ascites. No pneumoperitoneum. No pathologic distention of small
bowel. No lymphadenopathy identified within the abdomen or pelvis on
today's examination. Prostate gland and urinary bladder are
unremarkable in appearance.

Musculoskeletal: There are no aggressive appearing lytic or blastic
lesions noted in the visualized portions of the skeleton.
IMPRESSION: 1. Interval growth of what is now a 7.1 x 5.3 cm complex
multilocular cyst with thin internal septations (Bosniak class II-F)
in the lateral aspect of the upper pole of the right kidney.
Although this still has a relatively benign appearance, with only
thin internal septations, given the large number of septations and
the interval growth compared to the prior study, the possibility of
neoplasm is difficult to exclude. No overt neoplastic changes are
otherwise noted (i.e., no enhancing mural nodularity is confidently
identified). Urologic consultation is recommended.
2. Other cystic lesions in the kidneys bilaterally appear similar to
the prior study.
3. Atherosclerosis including right coronary artery disease.
4. Additional incidental findings, as above.

## 2015-05-22 DIAGNOSIS — C61 Malignant neoplasm of prostate: Secondary | ICD-10-CM | POA: Diagnosis not present

## 2015-06-10 ENCOUNTER — Ambulatory Visit
Admission: RE | Admit: 2015-06-10 | Discharge: 2015-06-10 | Disposition: A | Discharge status: Home / Self Care | Payer: Medicare Other | Source: Ambulatory Visit | Attending: Radiation Oncology | Admitting: Radiation Oncology

## 2015-06-10 ENCOUNTER — Encounter: Payer: Self-pay | Admitting: Radiation Oncology

## 2015-06-10 VITALS — BP 134/66 | HR 64 | Temp 97.5°F | Resp 20 | Wt 209.4 lb

## 2015-06-10 DIAGNOSIS — C61 Malignant neoplasm of prostate: Secondary | ICD-10-CM | POA: Diagnosis not present

## 2015-06-10 NOTE — Progress Notes (Signed)
FUNC prostate cancer, had 3 gold seed markers implanted 05/22/15 by Dr. Lowella Bandy,  No hematuria,dysuria, hesitancy,urgency or frequency, good strema only gets up at night 1x if any stated, regular bowel movemnts daily, only pain in left knee, chronic , stifness when standing and starting to walk, appetite good, no fatigue stated 8:35 AM BP 134/66 mmHg  Pulse 64  Temp(Src) 97.5 F (36.4 C) (Oral)  Resp 20  Wt 209 lb 6.4 oz (94.983 kg)  Wt Readings from Last 3 Encounters:  06/10/15 209 lb 6.4 oz (94.983 kg)  04/08/15 210 lb 1.6 oz (95.301 kg)  03/11/15 201 lb 9.6 oz (91.445 kg)

## 2015-06-10 NOTE — Progress Notes (Signed)
CC: Dr. Lowella Bandy, Dr. Walker Kehr  Follow-up note:  Diagnosis: Clinical stage TIc high-risk adenocarcinoma prostate  Edward [name] returns today for review and scheduling of his external beam/IMRT in the management of his stage TIc high-risk adenocarcinoma prostate. I saw him at the multidisciplinary prostate clinic on 03/11/2015. He presented in 2010 with an elevated PSA of 6.24. Prostate biopsies at that time revealed low volume Gleason 6 (3+3). He elected for close surveillance. His first follow-up biopsy in 2011 was benign and at the time reassuring. Follow-up biopsies in December 2013 revealed the both Gleason 9 (4+5) and also Gleason 7 (4+3) disease, both from the left gland. He elected for cryotherapy. His PSA dropped to 0.79 by 2014. Since then it had been trending upward. He was doing reasonably well from a GU and GI standpoint. His I PSS score is 2. He has been impotent since his cryotherapy in 2013. His staging workup included a negative bone scan on 12/23/2014 and negative CT scan of the abdomen and pelvis on 05/24/2014.We felt that he should be restaged with a more recent CT scan, repeat his PSA, and undergo repeat biopsies. His repeat PSA on 03/20/2015 was 6.76. His CT scan of the pelvis on 03/20/2015 was without evidence for metastatic disease. Repeat biopsies by Dr. Janice Norrie on 04/01/2015 showed Gleason 8 (4+3) involving 20% of one core from the right mid gland and 20% of one core from right lateral apex. He also had Gleason 7 (4+3) involving 20% of one core from the left lateral apex and 25% of one core from left apex. His gland volume was 24.6 mL.he elected for external beam/IMRT along with androgen deprivation therapy.  He began his androgen deprivation therapy on April 22 and 3 gold seed markers were placed on June 2 by Dr. Janice Norrie.  He does admit to hot flashes and some fatigue.  He is otherwise without complaints today.  Physical examination: Alert and oriented. Filed  Vitals:   06/10/15 0831  BP: 134/66  Pulse: 64  Temp: 97.5 F (36.4 C)  Resp: 20   Rectal examination not performed today.  Impression: Stage TIc high-risk adenocarcinoma prostate.  We again discussed the potential acute and late toxicities of radiation therapy.  We also spent time discussing bladder filling to minimize urinary toxicity.  He was shown a video of IMRT treatment showing the benefit of bladder filling.  Consent was previously signed.  I will have him return for CT simulation within the next week.  Plan: As discussed above.  30 minutes was spent face-to-face with the patient, primarily counseling patient and coordinating his care.

## 2015-06-11 NOTE — Addendum Note (Signed)
Encounter addended by: Doreen Beam, RN on: 06/11/2015 10:10 AM<BR>     Documentation filed: Charges VN

## 2015-06-11 NOTE — Addendum Note (Signed)
Encounter addended by: Doreen Beam, RN on: 06/11/2015 10:09 AM<BR>     Documentation filed: Charges VN

## 2015-06-12 ENCOUNTER — Ambulatory Visit
Admission: RE | Admit: 2015-06-12 | Discharge: 2015-06-12 | Disposition: A | Discharge status: Home / Self Care | Payer: Medicare Other | Source: Ambulatory Visit | Attending: Radiation Oncology | Admitting: Radiation Oncology

## 2015-06-12 DIAGNOSIS — C61 Malignant neoplasm of prostate: Secondary | ICD-10-CM | POA: Diagnosis not present

## 2015-06-12 NOTE — Progress Notes (Signed)
Complex simulation/treatment planning note: The patient was taken to the CT simulator.  A Vac lock immobilization device was constructed.  A red rubber catheter was placed within the rectal vault.  He was then catheterized and contrast instilled into the bladder/urethra.  He was then scanned.  He was noted to have a fair amount of stool within the rectal vault.  I questioned the patient about his rectal filling, and he tells me that he just moved his bowels and he does not feels if he needs to move his bowels at this time.  Therefore, I made the assumption that this was his baseline rectal anatomy.  I chose and isocenter.  The CT data set was sent to the  MIM planning system where contoured his prostate, seminal vesicles, bladder, rectum, and lower rectosigmoid colon.  He is now ready for IMRT simulation/treatment planning.  I prescribing 7800 cGy to his prostate PTV which represents the prostate +0.8 cm except for 0.5 cm along the rectum.  I am prescribing 5600 cGy in 40 sessions to his seminal vesicle PTV which represents the seminal vesicles +0.5 cm.

## 2015-06-16 ENCOUNTER — Encounter: Payer: Self-pay | Admitting: Radiation Oncology

## 2015-06-16 DIAGNOSIS — C61 Malignant neoplasm of prostate: Secondary | ICD-10-CM | POA: Diagnosis not present

## 2015-06-16 NOTE — Progress Notes (Signed)
IMRT simulation/treatment planning note: The patient completed his IMRT treatment planning for his cancer the prostate.  IMRT was chosen to decrease the risk for both acute and late bladder and rectal toxicity.  Dose volume histograms were obtained for the bladder, rectum, and femoral heads and we met our departmental guidelines.  Dose volume histograms were obtained for the target structures including the prostate PTV and the seminal vesicle PTV and we met our departmental guidelines.  I'm prescribing 7800 cGy in 40 sessions to his prostate PTV and 5600 cGy in 40 sessions to his seminal vesicle PTV.  He is treated with 6 MV photons, dual ARC VMAT IMRT.

## 2015-06-19 DIAGNOSIS — N2581 Secondary hyperparathyroidism of renal origin: Secondary | ICD-10-CM | POA: Diagnosis not present

## 2015-06-19 DIAGNOSIS — N2889 Other specified disorders of kidney and ureter: Secondary | ICD-10-CM | POA: Diagnosis not present

## 2015-06-19 DIAGNOSIS — N183 Chronic kidney disease, stage 3 (moderate): Secondary | ICD-10-CM | POA: Diagnosis not present

## 2015-06-25 ENCOUNTER — Ambulatory Visit
Admission: RE | Admit: 2015-06-25 | Discharge: 2015-06-25 | Disposition: A | Discharge status: Home / Self Care | Payer: Medicare Other | Source: Ambulatory Visit | Attending: Radiation Oncology | Admitting: Radiation Oncology

## 2015-06-25 DIAGNOSIS — C61 Malignant neoplasm of prostate: Secondary | ICD-10-CM | POA: Diagnosis not present

## 2015-06-26 ENCOUNTER — Ambulatory Visit
Admission: RE | Admit: 2015-06-26 | Discharge: 2015-06-26 | Disposition: A | Discharge status: Home / Self Care | Payer: Medicare Other | Source: Ambulatory Visit | Attending: Radiation Oncology | Admitting: Radiation Oncology

## 2015-06-26 ENCOUNTER — Encounter: Payer: Self-pay | Admitting: Medical Oncology

## 2015-06-26 DIAGNOSIS — C61 Malignant neoplasm of prostate: Secondary | ICD-10-CM | POA: Diagnosis not present

## 2015-06-26 NOTE — Progress Notes (Signed)
Oncology Nurse Navigator Documentation  Oncology Nurse Navigator Flowsheets 06/10/2015  Navigator Encounter Type Initial RadOnc- I met Edward [name] in the Prostate [facility] 03/11/2015. He is doing well and is here today to discuss starting his radiation treatments. He and has wife know how to contact me and know they can contact me with any questions or concerns.  Patient Visit Type Radonc  Time Spent with Patient 30

## 2015-06-27 ENCOUNTER — Ambulatory Visit
Admission: RE | Admit: 2015-06-27 | Discharge: 2015-06-27 | Disposition: A | Discharge status: Home / Self Care | Payer: Medicare Other | Source: Ambulatory Visit | Attending: Radiation Oncology | Admitting: Radiation Oncology

## 2015-06-27 DIAGNOSIS — C61 Malignant neoplasm of prostate: Secondary | ICD-10-CM | POA: Diagnosis not present

## 2015-06-30 ENCOUNTER — Encounter: Payer: Self-pay | Admitting: Medical Oncology

## 2015-06-30 ENCOUNTER — Ambulatory Visit
Admission: RE | Admit: 2015-06-30 | Discharge: 2015-06-30 | Disposition: A | Discharge status: Home / Self Care | Payer: Medicare Other | Source: Ambulatory Visit | Attending: Radiation Oncology | Admitting: Radiation Oncology

## 2015-06-30 DIAGNOSIS — C61 Malignant neoplasm of prostate: Secondary | ICD-10-CM

## 2015-06-30 NOTE — Progress Notes (Signed)
   Weekly Management Note:  outpatient    ICD-9-CM ICD-10-CM   1. Prostate cancer 185 C61     Current Dose:  7.8 Gy  Projected Dose: 78 Gy   Narrative:  The patient presents for routine under treatment assessment.  CBCT/MVCT images/Port film x-rays were reviewed.  The chart was checked. NAD, nocturia twice a night, not bothersome  Physical Findings:  vitals were not taken for this visit.  Wt Readings from Last 3 Encounters:  06/10/15 209 lb 6.4 oz (94.983 kg)  04/08/15 210 lb 1.6 oz (95.301 kg)  03/11/15 201 lb 9.6 oz (91.445 kg)   NAD  Impression:  The patient is tolerating radiotherapy.  Plan:  Continue radiotherapy as planned.    ________________________________   Eppie Gibson, M.D.

## 2015-06-30 NOTE — Progress Notes (Signed)
Oncology Nurse Navigator Documentation  Oncology Nurse Navigator Flowsheets 06/10/2015 06/30/2015  Navigator Encounter Type Initial RadOnc -  Patient Visit Type Radonc I saw Mr. Bohnet and his wife this morning prior to his radiation treatment. He is doing well with no complaints. His wife states she is encouraging him to eat a healthy diet and to drink plenty of fluids. I asked them to call me with any concerns and they voiced understanding.  Barriers/Navigation Needs - No barriers at this time  Support Groups/Services - Friends and Family  Time Spent with Patient (Retired) 23 -

## 2015-06-30 NOTE — Progress Notes (Addendum)
Mr. Nawrot has received 4 fractions to his pelvis for prostate cancer.  He already notes that he is experiencing nocturia x 2 since starting treatment.  No other concerns at this time.  Normally has a slow urinary stream.  Hot Flashes since start of antiandrogen therapy.  Denies any muscle aches.  Pt here for patient teaching.  Pt given Radiation and You booklet and skin care instructions. Reviewed areas of pertinence such as diarrhea, fatigue, hair loss, sexual and fertility changes, skin changes, urinary and bladder changes, blurry vision, breast tenderness, breast swelling, cough, shortness of breath, earaches and taste changes . Pt able to give teach back of to pat skin, use unscented/gentle soap, use baby wipes, have Imodium on hand, drink plenty of water and sitz bath,avoid applying anything to skin within 4 hours of treatment. Pt demonstrated understanding of information given and will contact nursing with any questions or concerns. Informed that he will be assessed by Dr. Valere Dross every Monday after treatment, but if this changes he will be notified in advance.  Teach back

## 2015-06-30 NOTE — Addendum Note (Signed)
Encounter addended by: Benn Moulder, RN on: 06/30/2015  3:24 PM<BR>     Documentation filed: Notes Section

## 2015-07-01 ENCOUNTER — Ambulatory Visit
Admission: RE | Admit: 2015-07-01 | Discharge: 2015-07-01 | Disposition: A | Discharge status: Home / Self Care | Payer: Medicare Other | Source: Ambulatory Visit | Attending: Radiation Oncology | Admitting: Radiation Oncology

## 2015-07-01 DIAGNOSIS — C61 Malignant neoplasm of prostate: Secondary | ICD-10-CM | POA: Diagnosis not present

## 2015-07-02 ENCOUNTER — Ambulatory Visit
Admission: RE | Admit: 2015-07-02 | Discharge: 2015-07-02 | Disposition: A | Discharge status: Home / Self Care | Payer: Medicare Other | Source: Ambulatory Visit | Attending: Radiation Oncology | Admitting: Radiation Oncology

## 2015-07-02 DIAGNOSIS — C61 Malignant neoplasm of prostate: Secondary | ICD-10-CM | POA: Diagnosis not present

## 2015-07-03 ENCOUNTER — Ambulatory Visit
Admission: RE | Admit: 2015-07-03 | Discharge: 2015-07-03 | Disposition: A | Discharge status: Home / Self Care | Payer: Medicare Other | Source: Ambulatory Visit | Attending: Radiation Oncology | Admitting: Radiation Oncology

## 2015-07-03 DIAGNOSIS — C61 Malignant neoplasm of prostate: Secondary | ICD-10-CM | POA: Diagnosis not present

## 2015-07-04 ENCOUNTER — Ambulatory Visit
Admission: RE | Admit: 2015-07-04 | Discharge: 2015-07-04 | Disposition: A | Discharge status: Home / Self Care | Payer: Medicare Other | Source: Ambulatory Visit | Attending: Radiation Oncology | Admitting: Radiation Oncology

## 2015-07-04 DIAGNOSIS — C61 Malignant neoplasm of prostate: Secondary | ICD-10-CM | POA: Diagnosis not present

## 2015-07-07 ENCOUNTER — Encounter: Payer: Self-pay | Admitting: Medical Oncology

## 2015-07-07 ENCOUNTER — Ambulatory Visit
Admission: RE | Admit: 2015-07-07 | Discharge: 2015-07-07 | Disposition: A | Discharge status: Home / Self Care | Payer: Medicare Other | Source: Ambulatory Visit | Attending: Radiation Oncology | Admitting: Radiation Oncology

## 2015-07-07 VITALS — BP 136/69 | HR 64 | Temp 97.6°F | Resp 12 | Wt 210.6 lb

## 2015-07-07 DIAGNOSIS — C61 Malignant neoplasm of prostate: Secondary | ICD-10-CM | POA: Insufficient documentation

## 2015-07-07 DIAGNOSIS — Z510 Encounter for antineoplastic radiation therapy: Secondary | ICD-10-CM | POA: Insufficient documentation

## 2015-07-07 NOTE — Progress Notes (Signed)
Weekly Management Note:  Site: Prostate Current Dose:  1755  cGy Projected Dose: 7800  cGy  Narrative: The patient is seen today for routine under treatment assessment. CBCT/MVCT images/port films were reviewed. The chart was reviewed.   Bladder filling is satisfactory.  No new GU or GI difficulties.  Physical Examination:  Filed Vitals:   07/07/15 1027  BP: 136/69  Pulse: 64  Temp: 97.6 F (36.4 C)  Resp: 12  .  Weight: 210 lb 9.6 oz (95.528 kg).  No change.  Impression: Tolerating radiation therapy well.  Plan: Continue radiation therapy as planned.

## 2015-07-07 NOTE — Progress Notes (Signed)
PAIN: He is currently in no pain. URINARY: Pt reports urinary hot flashes. Pt states they urinate 2 - 3 times per night.  BOWEL: Pt reports a soft bowel movement everyday/everyother day. OTHER: Pt complains of fatigue BP 136/69 mmHg  Pulse 64  Temp(Src) 97.6 F (36.4 C) (Oral)  Resp 12  Wt 210 lb 9.6 oz (95.528 kg)  SpO2 100% Wt Readings from Last 3 Encounters:  07/07/15 210 lb 9.6 oz (95.528 kg)  06/10/15 209 lb 6.4 oz (94.983 kg)  04/08/15 210 lb 1.6 oz (95.301 kg)

## 2015-07-08 ENCOUNTER — Ambulatory Visit
Admission: RE | Admit: 2015-07-08 | Discharge: 2015-07-08 | Disposition: A | Discharge status: Home / Self Care | Payer: Medicare Other | Source: Ambulatory Visit | Attending: Radiation Oncology | Admitting: Radiation Oncology

## 2015-07-08 DIAGNOSIS — Z510 Encounter for antineoplastic radiation therapy: Secondary | ICD-10-CM | POA: Diagnosis not present

## 2015-07-08 DIAGNOSIS — C61 Malignant neoplasm of prostate: Secondary | ICD-10-CM | POA: Diagnosis not present

## 2015-07-09 ENCOUNTER — Ambulatory Visit
Admission: RE | Admit: 2015-07-09 | Discharge: 2015-07-09 | Disposition: A | Discharge status: Home / Self Care | Payer: Medicare Other | Source: Ambulatory Visit | Attending: Radiation Oncology | Admitting: Radiation Oncology

## 2015-07-09 DIAGNOSIS — C61 Malignant neoplasm of prostate: Secondary | ICD-10-CM | POA: Diagnosis not present

## 2015-07-09 DIAGNOSIS — Z510 Encounter for antineoplastic radiation therapy: Secondary | ICD-10-CM | POA: Diagnosis not present

## 2015-07-10 ENCOUNTER — Ambulatory Visit
Admission: RE | Admit: 2015-07-10 | Discharge: 2015-07-10 | Disposition: A | Discharge status: Home / Self Care | Payer: Medicare Other | Source: Ambulatory Visit | Attending: Radiation Oncology | Admitting: Radiation Oncology

## 2015-07-10 DIAGNOSIS — C61 Malignant neoplasm of prostate: Secondary | ICD-10-CM | POA: Diagnosis not present

## 2015-07-10 DIAGNOSIS — Z510 Encounter for antineoplastic radiation therapy: Secondary | ICD-10-CM | POA: Diagnosis not present

## 2015-07-11 ENCOUNTER — Ambulatory Visit
Admission: RE | Admit: 2015-07-11 | Discharge: 2015-07-11 | Disposition: A | Discharge status: Home / Self Care | Payer: Medicare Other | Source: Ambulatory Visit | Attending: Radiation Oncology | Admitting: Radiation Oncology

## 2015-07-11 DIAGNOSIS — C61 Malignant neoplasm of prostate: Secondary | ICD-10-CM | POA: Diagnosis not present

## 2015-07-11 DIAGNOSIS — Z510 Encounter for antineoplastic radiation therapy: Secondary | ICD-10-CM | POA: Diagnosis not present

## 2015-07-14 ENCOUNTER — Encounter: Payer: Self-pay | Admitting: Medical Oncology

## 2015-07-14 ENCOUNTER — Ambulatory Visit
Admission: RE | Admit: 2015-07-14 | Discharge: 2015-07-14 | Disposition: A | Discharge status: Home / Self Care | Payer: Medicare Other | Source: Ambulatory Visit | Attending: Radiation Oncology | Admitting: Radiation Oncology

## 2015-07-14 DIAGNOSIS — Z510 Encounter for antineoplastic radiation therapy: Secondary | ICD-10-CM | POA: Diagnosis not present

## 2015-07-14 DIAGNOSIS — C61 Malignant neoplasm of prostate: Secondary | ICD-10-CM

## 2015-07-14 NOTE — Progress Notes (Signed)
Oncology Nurse Navigator Documentation  Oncology Nurse Navigator Flowsheets 06/30/2015 07/07/2015 07/14/2015  Referral date to RadOnc/MedOnc - - 07/14/2015  Navigator Encounter Type - - -  Patient Visit Type Radonc Radonc Radonc- Mr. Engelmann states his radiation is going well. He states he has nocturia about 3-4 times a night. I informed him this is a side effect of the radiation. He voiced understanding. No other concerns at this time.  Treatment Phase Treatment Treatment Treatment  Barriers/Navigation Needs No barriers at this time - No barriers at this time  Support Groups/Services Friends and Family - -  Time Spent with Patient 15 15 15

## 2015-07-14 NOTE — Progress Notes (Signed)
Weekly Management Note:  Site:  prostate Current Dose:   2730  cGy Projected Dose:  7800  cGy  Narrative: The patient is seen today for routine under treatment assessment. CBCT/MVCT images/port films were reviewed. The chart was reviewed.    Bladder filling is satisfactory.  No new GU or GI difficulties except for slight hesitancy.  Physical Examination: There were no vitals filed for this visit..  Weight:  .  No change.  Impression: Tolerating radiation therapy well.  Plan: Continue radiation therapy as planned.

## 2015-07-14 NOTE — Progress Notes (Signed)
[name] reports nocturia 3-4 times.  Denies any straining to void, slow stream, nor dribbling.  Does report stop and start pattern to voiding.  No proctitis nor diarrhea

## 2015-07-15 ENCOUNTER — Ambulatory Visit
Admission: RE | Admit: 2015-07-15 | Discharge: 2015-07-15 | Disposition: A | Discharge status: Home / Self Care | Payer: Medicare Other | Source: Ambulatory Visit | Attending: Radiation Oncology | Admitting: Radiation Oncology

## 2015-07-15 DIAGNOSIS — C61 Malignant neoplasm of prostate: Secondary | ICD-10-CM | POA: Diagnosis not present

## 2015-07-15 DIAGNOSIS — Z510 Encounter for antineoplastic radiation therapy: Secondary | ICD-10-CM | POA: Diagnosis not present

## 2015-07-16 ENCOUNTER — Ambulatory Visit
Admission: RE | Admit: 2015-07-16 | Discharge: 2015-07-16 | Disposition: A | Discharge status: Home / Self Care | Payer: Medicare Other | Source: Ambulatory Visit | Attending: Radiation Oncology | Admitting: Radiation Oncology

## 2015-07-16 DIAGNOSIS — Z510 Encounter for antineoplastic radiation therapy: Secondary | ICD-10-CM | POA: Diagnosis not present

## 2015-07-16 DIAGNOSIS — C61 Malignant neoplasm of prostate: Secondary | ICD-10-CM | POA: Diagnosis not present

## 2015-07-17 ENCOUNTER — Ambulatory Visit
Admission: RE | Admit: 2015-07-17 | Discharge: 2015-07-17 | Disposition: A | Discharge status: Home / Self Care | Payer: Medicare Other | Source: Ambulatory Visit | Attending: Radiation Oncology | Admitting: Radiation Oncology

## 2015-07-17 ENCOUNTER — Encounter: Payer: Self-pay | Admitting: Medical Oncology

## 2015-07-17 DIAGNOSIS — Z510 Encounter for antineoplastic radiation therapy: Secondary | ICD-10-CM | POA: Diagnosis not present

## 2015-07-17 DIAGNOSIS — C61 Malignant neoplasm of prostate: Secondary | ICD-10-CM | POA: Diagnosis not present

## 2015-07-18 ENCOUNTER — Ambulatory Visit
Admission: RE | Admit: 2015-07-18 | Discharge: 2015-07-18 | Disposition: A | Discharge status: Home / Self Care | Payer: Medicare Other | Source: Ambulatory Visit | Attending: Radiation Oncology | Admitting: Radiation Oncology

## 2015-07-18 DIAGNOSIS — C61 Malignant neoplasm of prostate: Secondary | ICD-10-CM | POA: Diagnosis not present

## 2015-07-18 DIAGNOSIS — Z510 Encounter for antineoplastic radiation therapy: Secondary | ICD-10-CM | POA: Diagnosis not present

## 2015-07-21 ENCOUNTER — Ambulatory Visit
Admission: RE | Admit: 2015-07-21 | Discharge: 2015-07-21 | Disposition: A | Discharge status: Home / Self Care | Payer: Medicare Other | Source: Ambulatory Visit | Attending: Radiation Oncology | Admitting: Radiation Oncology

## 2015-07-21 DIAGNOSIS — Z510 Encounter for antineoplastic radiation therapy: Secondary | ICD-10-CM | POA: Diagnosis not present

## 2015-07-21 DIAGNOSIS — C61 Malignant neoplasm of prostate: Secondary | ICD-10-CM | POA: Diagnosis not present

## 2015-07-22 ENCOUNTER — Ambulatory Visit
Admission: RE | Admit: 2015-07-22 | Discharge: 2015-07-22 | Disposition: A | Discharge status: Home / Self Care | Payer: Medicare Other | Source: Ambulatory Visit | Attending: Radiation Oncology | Admitting: Radiation Oncology

## 2015-07-22 ENCOUNTER — Encounter: Payer: Self-pay | Admitting: Radiation Oncology

## 2015-07-22 VITALS — BP 105/66 | HR 69 | Temp 98.3°F | Ht 73.0 in | Wt 208.2 lb

## 2015-07-22 DIAGNOSIS — Z510 Encounter for antineoplastic radiation therapy: Secondary | ICD-10-CM | POA: Diagnosis not present

## 2015-07-22 DIAGNOSIS — C61 Malignant neoplasm of prostate: Secondary | ICD-10-CM

## 2015-07-22 NOTE — Progress Notes (Addendum)
PAIN: He is currently in no pain. No dysuria over N/A. URINARY: Pt reports urinary No urgency or frquency, frequency, urgency, retention, hesistency, pain with urination, hot flashes, flank pain, blood in urine, incontinence and dribbling. Pt states they urinate 3 - 4 times per night.  BOWEL: No Change in bowel habits Pt reports a soft bowel movement everyday/everyother day. OTHER: Pt complains of No fatigue BP 105/66 mmHg  Pulse 69  Temp(Src) 98.3 F (36.8 C)  Ht 6\' 1"  (1.854 m)  Wt 208 lb 3.2 oz (94.439 kg)  BMI 27.47 kg/m2 Wt Readings from Last 3 Encounters:  07/22/15 208 lb 3.2 oz (94.439 kg)  07/07/15 210 lb 9.6 oz (95.528 kg)  06/10/15 209 lb 6.4 oz (94.983 kg)

## 2015-07-22 NOTE — Progress Notes (Signed)
Weekly Management Note:  Site: Prostate Current Dose:  3900  cGy Projected Dose: 7800  cGy  Narrative: The patient is seen today for routine under treatment assessment. CBCT/MVCT images/port films were reviewed. The chart was reviewed.   Bladder filling today was suboptimal since he had to urinate before leaving home to come in for his treatment today.  No new GU or GI difficulties.  Physical Examination:  Filed Vitals:   07/22/15 1035  BP: 105/66  Pulse: 69  Temp: 98.3 F (36.8 C)  .  Weight: 208 lb 3.2 oz (94.439 kg).  No change.  Impression: Tolerating radiation therapy well.  Plan: Continue radiation therapy as planned.

## 2015-07-23 ENCOUNTER — Ambulatory Visit
Admission: RE | Admit: 2015-07-23 | Discharge: 2015-07-23 | Disposition: A | Discharge status: Home / Self Care | Payer: Medicare Other | Source: Ambulatory Visit | Attending: Radiation Oncology | Admitting: Radiation Oncology

## 2015-07-23 DIAGNOSIS — C61 Malignant neoplasm of prostate: Secondary | ICD-10-CM | POA: Diagnosis not present

## 2015-07-23 DIAGNOSIS — Z510 Encounter for antineoplastic radiation therapy: Secondary | ICD-10-CM | POA: Diagnosis not present

## 2015-07-24 ENCOUNTER — Encounter: Payer: Self-pay | Admitting: Medical Oncology

## 2015-07-24 ENCOUNTER — Ambulatory Visit
Admission: RE | Admit: 2015-07-24 | Discharge: 2015-07-24 | Disposition: A | Discharge status: Home / Self Care | Payer: Medicare Other | Source: Ambulatory Visit | Attending: Radiation Oncology | Admitting: Radiation Oncology

## 2015-07-24 DIAGNOSIS — Z510 Encounter for antineoplastic radiation therapy: Secondary | ICD-10-CM | POA: Diagnosis not present

## 2015-07-24 DIAGNOSIS — C61 Malignant neoplasm of prostate: Secondary | ICD-10-CM | POA: Diagnosis not present

## 2015-07-24 NOTE — Progress Notes (Signed)
Oncology Nurse Navigator Documentation  Oncology Nurse Navigator Flowsheets 07/14/2015 07/17/2015 07/24/2015  Referral date to RadOnc/MedOnc 07/14/2015 - -  Navigator Encounter Type - Treatment Treatment- Edward [name] is doing well with his radiation treatments. No complaints or issues.   Patient Visit Type Radonc Radonc Radonc  Treatment Phase Treatment Treatment Treatment  Barriers/Navigation Needs No barriers at this time No barriers at this time No barriers at this time  Support Groups/Services - Friends and Family -  Time Spent with Patient 15 15 15

## 2015-07-25 ENCOUNTER — Ambulatory Visit
Admission: RE | Admit: 2015-07-25 | Discharge: 2015-07-25 | Disposition: A | Discharge status: Home / Self Care | Payer: Medicare Other | Source: Ambulatory Visit | Attending: Radiation Oncology | Admitting: Radiation Oncology

## 2015-07-25 DIAGNOSIS — C61 Malignant neoplasm of prostate: Secondary | ICD-10-CM | POA: Diagnosis not present

## 2015-07-25 DIAGNOSIS — Z510 Encounter for antineoplastic radiation therapy: Secondary | ICD-10-CM | POA: Diagnosis not present

## 2015-07-28 ENCOUNTER — Ambulatory Visit
Admission: RE | Admit: 2015-07-28 | Discharge: 2015-07-28 | Disposition: A | Discharge status: Home / Self Care | Payer: Medicare Other | Source: Ambulatory Visit | Attending: Radiation Oncology | Admitting: Radiation Oncology

## 2015-07-28 ENCOUNTER — Encounter: Payer: Self-pay | Admitting: Radiation Oncology

## 2015-07-28 VITALS — BP 129/69 | HR 69 | Temp 97.9°F | Resp 20 | Wt 208.6 lb

## 2015-07-28 DIAGNOSIS — C61 Malignant neoplasm of prostate: Secondary | ICD-10-CM | POA: Diagnosis not present

## 2015-07-28 DIAGNOSIS — Z510 Encounter for antineoplastic radiation therapy: Secondary | ICD-10-CM | POA: Diagnosis not present

## 2015-07-28 NOTE — Progress Notes (Signed)
Weekly rad txs  Prostate 24/40  Slight dysuria when first starting voiding, has frequency every 3 hours, and nocturia x3, no hematuria, regular bowel movements, legs hurting  From knee fx 2-014  Appetite good , tires easily  10:27 AM BP 129/69 mmHg  Pulse 69  Temp(Src) 97.9 F (36.6 C) (Oral)  Resp 20  Wt 208 lb 9.6 oz (94.62 kg)  Wt Readings from Last 3 Encounters:  07/28/15 208 lb 9.6 oz (94.62 kg)  07/22/15 208 lb 3.2 oz (94.439 kg)  07/07/15 210 lb 9.6 oz (95.528 kg)

## 2015-07-28 NOTE — Progress Notes (Signed)
   Weekly Management Note:  outpatient    ICD-9-CM ICD-10-CM   1. Prostate cancer 185 C61     Current Dose: 46.8 Gy  Projected Dose: 78 Gy   Narrative:  The patient presents for routine under treatment assessment.  CBCT/MVCT images/Port film x-rays were reviewed.  The chart was checked. NAD, denies new issues, continues to have stable nocturia  Physical Findings:  weight is 208 lb 9.6 oz (94.62 kg). His oral temperature is 97.9 F (36.6 C). His blood pressure is 129/69 and his pulse is 69. His respiration is 20.   Wt Readings from Last 3 Encounters:  07/28/15 208 lb 9.6 oz (94.62 kg)  07/22/15 208 lb 3.2 oz (94.439 kg)  07/07/15 210 lb 9.6 oz (95.528 kg)   NAD  Impression:  The patient is tolerating radiotherapy.  Plan:  Continue radiotherapy as planned.    ________________________________   Eppie Gibson, M.D.

## 2015-07-29 ENCOUNTER — Encounter: Payer: Self-pay | Admitting: Medical Oncology

## 2015-07-29 ENCOUNTER — Ambulatory Visit
Admission: RE | Admit: 2015-07-29 | Discharge: 2015-07-29 | Disposition: A | Discharge status: Home / Self Care | Payer: Medicare Other | Source: Ambulatory Visit | Attending: Radiation Oncology | Admitting: Radiation Oncology

## 2015-07-29 DIAGNOSIS — C61 Malignant neoplasm of prostate: Secondary | ICD-10-CM | POA: Diagnosis not present

## 2015-07-29 DIAGNOSIS — Z510 Encounter for antineoplastic radiation therapy: Secondary | ICD-10-CM | POA: Diagnosis not present

## 2015-07-30 ENCOUNTER — Ambulatory Visit
Admission: RE | Admit: 2015-07-30 | Discharge: 2015-07-30 | Disposition: A | Discharge status: Home / Self Care | Payer: Medicare Other | Source: Ambulatory Visit | Attending: Radiation Oncology | Admitting: Radiation Oncology

## 2015-07-30 DIAGNOSIS — C61 Malignant neoplasm of prostate: Secondary | ICD-10-CM | POA: Diagnosis not present

## 2015-07-30 DIAGNOSIS — Z510 Encounter for antineoplastic radiation therapy: Secondary | ICD-10-CM | POA: Diagnosis not present

## 2015-07-31 ENCOUNTER — Ambulatory Visit
Admission: RE | Admit: 2015-07-31 | Discharge: 2015-07-31 | Disposition: A | Discharge status: Home / Self Care | Payer: Medicare Other | Source: Ambulatory Visit | Attending: Radiation Oncology | Admitting: Radiation Oncology

## 2015-07-31 DIAGNOSIS — Z510 Encounter for antineoplastic radiation therapy: Secondary | ICD-10-CM | POA: Diagnosis not present

## 2015-07-31 DIAGNOSIS — C61 Malignant neoplasm of prostate: Secondary | ICD-10-CM | POA: Diagnosis not present

## 2015-08-01 ENCOUNTER — Ambulatory Visit
Admission: RE | Admit: 2015-08-01 | Discharge: 2015-08-01 | Disposition: A | Discharge status: Home / Self Care | Payer: Medicare Other | Source: Ambulatory Visit | Attending: Radiation Oncology | Admitting: Radiation Oncology

## 2015-08-01 DIAGNOSIS — Z510 Encounter for antineoplastic radiation therapy: Secondary | ICD-10-CM | POA: Diagnosis not present

## 2015-08-01 DIAGNOSIS — C61 Malignant neoplasm of prostate: Secondary | ICD-10-CM | POA: Diagnosis not present

## 2015-08-04 ENCOUNTER — Ambulatory Visit: Payer: Medicare Other

## 2015-08-04 ENCOUNTER — Ambulatory Visit
Admission: RE | Admit: 2015-08-04 | Discharge: 2015-08-04 | Disposition: A | Discharge status: Home / Self Care | Payer: Medicare Other | Source: Ambulatory Visit | Attending: Radiation Oncology | Admitting: Radiation Oncology

## 2015-08-04 ENCOUNTER — Encounter: Payer: Self-pay | Admitting: Radiation Oncology

## 2015-08-04 VITALS — BP 105/64 | HR 66 | Temp 97.9°F | Ht 73.0 in | Wt 208.3 lb

## 2015-08-04 DIAGNOSIS — C61 Malignant neoplasm of prostate: Secondary | ICD-10-CM | POA: Diagnosis not present

## 2015-08-04 DIAGNOSIS — Z510 Encounter for antineoplastic radiation therapy: Secondary | ICD-10-CM | POA: Diagnosis not present

## 2015-08-04 NOTE — Progress Notes (Signed)
[name] has received 29 fractions to his pelvis for prostate cancer.  He reports good flow, No dribbling, nocturia 3-4, no pain on urination, but reports intermittent "strange" sensation when voiding.  Denies any diarrhea nor proctitis.

## 2015-08-04 NOTE — Progress Notes (Signed)
Weekly Management Note:  Site: Prostate Current Dose:  5655  cGy Projected Dose: 7800  cGy  Narrative: The patient is seen today for routine under treatment assessment. CBCT/MVCT images/port films were reviewed. The chart was reviewed.   Bladder filling satisfactory.  He does have nocturia 3-4 with some slowing of his stream towards the end of urination.  He believes that he is able to empty his bladder.  He does have a good force of stream during most of his urination.  No GI difficulties.  Physical Examination:  Filed Vitals:   08/04/15 1034  BP: 105/64  Pulse: 66  Temp: 97.9 F (36.6 C)  .  Weight: 208 lb 4.8 oz (94.484 kg).  No change.  Impression: Tolerating radiation therapy well.  Plan: Continue radiation therapy as planned.

## 2015-08-05 ENCOUNTER — Ambulatory Visit
Admission: RE | Admit: 2015-08-05 | Discharge: 2015-08-05 | Disposition: A | Discharge status: Home / Self Care | Payer: Medicare Other | Source: Ambulatory Visit | Attending: Radiation Oncology | Admitting: Radiation Oncology

## 2015-08-05 ENCOUNTER — Ambulatory Visit: Payer: Medicare Other

## 2015-08-05 DIAGNOSIS — Z510 Encounter for antineoplastic radiation therapy: Secondary | ICD-10-CM | POA: Diagnosis not present

## 2015-08-05 DIAGNOSIS — C61 Malignant neoplasm of prostate: Secondary | ICD-10-CM | POA: Diagnosis not present

## 2015-08-06 ENCOUNTER — Ambulatory Visit
Admission: RE | Admit: 2015-08-06 | Discharge: 2015-08-06 | Disposition: A | Discharge status: Home / Self Care | Payer: Medicare Other | Source: Ambulatory Visit | Attending: Radiation Oncology | Admitting: Radiation Oncology

## 2015-08-06 ENCOUNTER — Ambulatory Visit: Payer: Medicare Other

## 2015-08-06 DIAGNOSIS — Z510 Encounter for antineoplastic radiation therapy: Secondary | ICD-10-CM | POA: Diagnosis not present

## 2015-08-06 DIAGNOSIS — C61 Malignant neoplasm of prostate: Secondary | ICD-10-CM | POA: Diagnosis not present

## 2015-08-07 ENCOUNTER — Ambulatory Visit
Admission: RE | Admit: 2015-08-07 | Discharge: 2015-08-07 | Disposition: A | Discharge status: Home / Self Care | Payer: Medicare Other | Source: Ambulatory Visit | Attending: Radiation Oncology | Admitting: Radiation Oncology

## 2015-08-07 ENCOUNTER — Ambulatory Visit: Payer: Medicare Other

## 2015-08-07 DIAGNOSIS — Z510 Encounter for antineoplastic radiation therapy: Secondary | ICD-10-CM | POA: Diagnosis not present

## 2015-08-07 DIAGNOSIS — C61 Malignant neoplasm of prostate: Secondary | ICD-10-CM | POA: Diagnosis not present

## 2015-08-08 ENCOUNTER — Ambulatory Visit: Payer: Medicare Other

## 2015-08-08 ENCOUNTER — Ambulatory Visit
Admission: RE | Admit: 2015-08-08 | Discharge: 2015-08-08 | Disposition: A | Discharge status: Home / Self Care | Payer: Medicare Other | Source: Ambulatory Visit | Attending: Radiation Oncology | Admitting: Radiation Oncology

## 2015-08-08 ENCOUNTER — Ambulatory Visit: Payer: Medicare Other | Attending: Radiation Oncology | Admitting: Radiation Oncology

## 2015-08-08 DIAGNOSIS — Z510 Encounter for antineoplastic radiation therapy: Secondary | ICD-10-CM | POA: Diagnosis not present

## 2015-08-08 DIAGNOSIS — C61 Malignant neoplasm of prostate: Secondary | ICD-10-CM | POA: Diagnosis not present

## 2015-08-11 ENCOUNTER — Ambulatory Visit
Admission: RE | Admit: 2015-08-11 | Discharge: 2015-08-11 | Disposition: A | Discharge status: Home / Self Care | Payer: Medicare Other | Source: Ambulatory Visit | Attending: Radiation Oncology | Admitting: Radiation Oncology

## 2015-08-11 ENCOUNTER — Encounter: Payer: Self-pay | Admitting: Radiation Oncology

## 2015-08-11 VITALS — BP 116/68 | HR 82 | Temp 98.2°F | Ht 73.0 in | Wt 206.7 lb

## 2015-08-11 DIAGNOSIS — C61 Malignant neoplasm of prostate: Secondary | ICD-10-CM | POA: Diagnosis not present

## 2015-08-11 DIAGNOSIS — Z510 Encounter for antineoplastic radiation therapy: Secondary | ICD-10-CM | POA: Diagnosis not present

## 2015-08-11 NOTE — Progress Notes (Signed)
Weekly Management Note:  Site: Prostate Current Dose:  6630  cGy Projected Dose: 7800  cGy  Narrative: The patient is seen today for routine under treatment assessment. CBCT/MVCT images/port films were reviewed. The chart was reviewed.   Bladder filling is satisfactory.  No new GU or GI difficulties.  He does have nocturia every 2 hours.  Physical Examination:  Filed Vitals:   08/11/15 1030  BP: 116/68  Pulse: 82  Temp:   .  Weight: 206 lb 11.2 oz (93.759 kg).  No change.  Impression: Tolerating radiation therapy well.  He'll finish his treatment next week.  Plan: Continue radiation therapy as planned.

## 2015-08-11 NOTE — Progress Notes (Addendum)
Edward [name] reports that when voiding he has an initial pressure before his stream starts and that it takes longer to empty bladder at this time, but does not need to strain.  Denies any rectal irritation, nor diarrhea.  Nocturia  Q 2 hours, but attributes this to "drinking liquids mostly at night".  Hypotension today.

## 2015-08-12 ENCOUNTER — Ambulatory Visit
Admission: RE | Admit: 2015-08-12 | Discharge: 2015-08-12 | Disposition: A | Discharge status: Home / Self Care | Payer: Medicare Other | Source: Ambulatory Visit | Attending: Radiation Oncology | Admitting: Radiation Oncology

## 2015-08-12 DIAGNOSIS — Z510 Encounter for antineoplastic radiation therapy: Secondary | ICD-10-CM | POA: Diagnosis not present

## 2015-08-12 DIAGNOSIS — C61 Malignant neoplasm of prostate: Secondary | ICD-10-CM | POA: Diagnosis not present

## 2015-08-13 ENCOUNTER — Ambulatory Visit
Admission: RE | Admit: 2015-08-13 | Discharge: 2015-08-13 | Disposition: A | Discharge status: Home / Self Care | Payer: Medicare Other | Source: Ambulatory Visit | Attending: Radiation Oncology | Admitting: Radiation Oncology

## 2015-08-13 DIAGNOSIS — C61 Malignant neoplasm of prostate: Secondary | ICD-10-CM | POA: Diagnosis not present

## 2015-08-13 DIAGNOSIS — Z510 Encounter for antineoplastic radiation therapy: Secondary | ICD-10-CM | POA: Diagnosis not present

## 2015-08-14 ENCOUNTER — Ambulatory Visit
Admission: RE | Admit: 2015-08-14 | Discharge: 2015-08-14 | Disposition: A | Discharge status: Home / Self Care | Payer: Medicare Other | Source: Ambulatory Visit | Attending: Radiation Oncology | Admitting: Radiation Oncology

## 2015-08-14 DIAGNOSIS — Z510 Encounter for antineoplastic radiation therapy: Secondary | ICD-10-CM | POA: Diagnosis not present

## 2015-08-14 DIAGNOSIS — C61 Malignant neoplasm of prostate: Secondary | ICD-10-CM | POA: Diagnosis not present

## 2015-08-15 ENCOUNTER — Ambulatory Visit
Admission: RE | Admit: 2015-08-15 | Discharge: 2015-08-15 | Disposition: A | Discharge status: Home / Self Care | Payer: Medicare Other | Source: Ambulatory Visit | Attending: Radiation Oncology | Admitting: Radiation Oncology

## 2015-08-15 DIAGNOSIS — Z510 Encounter for antineoplastic radiation therapy: Secondary | ICD-10-CM | POA: Diagnosis not present

## 2015-08-15 DIAGNOSIS — C61 Malignant neoplasm of prostate: Secondary | ICD-10-CM | POA: Diagnosis not present

## 2015-08-18 ENCOUNTER — Ambulatory Visit
Admission: RE | Admit: 2015-08-18 | Discharge: 2015-08-18 | Disposition: A | Discharge status: Home / Self Care | Payer: Medicare Other | Source: Ambulatory Visit | Attending: Radiation Oncology | Admitting: Radiation Oncology

## 2015-08-18 VITALS — BP 112/64 | HR 67 | Temp 98.0°F | Wt 206.4 lb

## 2015-08-18 DIAGNOSIS — C61 Malignant neoplasm of prostate: Secondary | ICD-10-CM | POA: Diagnosis not present

## 2015-08-18 DIAGNOSIS — Z510 Encounter for antineoplastic radiation therapy: Secondary | ICD-10-CM | POA: Diagnosis not present

## 2015-08-18 NOTE — Progress Notes (Signed)
Patient has completed 39 of 40 treatments to prostate.Has frequency and urgency of urination.No burning but does have some pressure.Denies pain.Increasee fatigue.d Discharge instructions provided and one month follow up. BP 112/64 mmHg  Pulse 67  Temp(Src) 98 F (36.7 C)  Wt 206 lb 6.4 oz (93.622 kg)

## 2015-08-18 NOTE — Progress Notes (Signed)
Weekly Management Note:  Site: Prostate Current Dose:  7605  cGy Projected Dose: 7800  cGy  Narrative: The patient is seen today for routine under treatment assessment. CBCT/MVCT images/port films were reviewed. The chart was reviewed.   Bladder filling is satisfactory.  He does have some urinary hesitancy and some slowing of his stream.  In addition, he does have some frequency and urgency but this is unchanged.  He will finish his radiation therapy tomorrow.  Physical Examination:  Filed Vitals:   08/18/15 1025  BP: 112/64  Pulse: 67  Temp: 98 F (36.7 C)  .  Weight: 206 lb 6.4 oz (93.622 kg).  No change.  Impression: Tolerating radiation therapy well.  Plan: Continue radiation therapy as planned.  One-month follow-up after completion of radiation therapy.

## 2015-08-19 ENCOUNTER — Encounter: Payer: Self-pay | Admitting: Radiation Oncology

## 2015-08-19 ENCOUNTER — Ambulatory Visit
Admission: RE | Admit: 2015-08-19 | Discharge: 2015-08-19 | Disposition: A | Discharge status: Home / Self Care | Payer: Medicare Other | Source: Ambulatory Visit | Attending: Radiation Oncology | Admitting: Radiation Oncology

## 2015-08-19 ENCOUNTER — Encounter: Payer: Self-pay | Admitting: Medical Oncology

## 2015-08-19 DIAGNOSIS — Z510 Encounter for antineoplastic radiation therapy: Secondary | ICD-10-CM | POA: Diagnosis not present

## 2015-08-19 DIAGNOSIS — C61 Malignant neoplasm of prostate: Secondary | ICD-10-CM | POA: Diagnosis not present

## 2015-08-19 NOTE — Progress Notes (Signed)
Oncology Nurse Navigator Documentation  Oncology Nurse Navigator Flowsheets 07/24/2015 07/29/2015 08/19/2015  Referral date to RadOnc/MedOnc - - -  Navigator Encounter Type Treatment Treatment Treatment- I celebrated  with Edward [name] and his wife as he rang the bell at the completion of his radiation treatments. He has done well except he does have some fatigue. We discussed this will begin to improve over time. I asked them to call me with any questions or concerns. He voiced understanding.  Patient Visit Type Radonc Radonc Radonc  Treatment Phase Treatment Treatment Final Radiation Tx  Barriers/Navigation Needs No barriers at this time No barriers at this time No barriers at this time  Support Groups/Services - Friends and Family Friends and Family  Time Spent with Patient 09 31 12

## 2015-08-19 NOTE — Progress Notes (Signed)
Chart note: [name] began his IMRT on 06/25/2015.  He was treated with 2 sets of dynamic MLCs corresponding to one set of IMRT treatment devices [phone]).

## 2015-08-19 NOTE — Progress Notes (Signed)
[facility] End of Treatment Note  Name:Edward [name] DEC  Date: 08/19/2015 KPV:[id number] DOB:Jun 08, 1933   Status:outpatient    CC: Walker Kehr, MD  Dr. Lowella Bandy  REFERRING PHYSICIAN:   Dr. Lowella Bandy    DIAGNOSIS: Clinical stage TIc high-risk adenocarcinoma prostate   INDICATION FOR TREATMENT: Curative   TREATMENT DATES: 06/25/2015 through 08/19/2015                          SITE/DOSE:   Prostate  7800 cGy in 40 sessions, seminal vesicles 5600 cGy in 40 sessions                       BEAMS/ENERGY: Dual ARC VMAT IMRT with 6 MV photons                  NARRATIVE:  [name] tolerated his treatment reasonably well although he did have worsening obstructive symptoms towards end of his treatment with worsening urinary frequency, hesitancy and nocturia.  He was not placed on an alpha blocker.  No significant GI toxicity.                          PLAN: Routine followup in one month. Patient instructed to call if questions or worsening complaints in interim.

## 2015-08-22 DIAGNOSIS — C61 Malignant neoplasm of prostate: Secondary | ICD-10-CM | POA: Diagnosis not present

## 2015-09-22 ENCOUNTER — Encounter: Payer: Self-pay | Admitting: Radiation Oncology

## 2015-09-23 ENCOUNTER — Encounter: Payer: Self-pay | Admitting: Medical Oncology

## 2015-09-23 ENCOUNTER — Encounter: Payer: Self-pay | Admitting: Radiation Oncology

## 2015-09-23 ENCOUNTER — Ambulatory Visit
Admission: RE | Admit: 2015-09-23 | Discharge: 2015-09-23 | Disposition: A | Discharge status: Home / Self Care | Payer: Medicare Other | Source: Ambulatory Visit | Attending: Radiation Oncology | Admitting: Radiation Oncology

## 2015-09-23 VITALS — BP 117/66 | HR 66 | Temp 97.8°F | Ht 73.0 in | Wt 203.0 lb

## 2015-09-23 DIAGNOSIS — C61 Malignant neoplasm of prostate: Secondary | ICD-10-CM

## 2015-09-23 HISTORY — DX: Personal history of irradiation: Z92.3

## 2015-09-23 NOTE — Progress Notes (Signed)
CC: Dr. Rana Snare, Dr. Walker Kehr  Follow-up note:  [name] returns today approximately 5 weeks following completion of external beam/IMRT in the management of his stage TIc high-risk adenocarcinoma prostate.  He is doing well from a GU and GI standpoint.  He does have nocturia 2-3.  His last four-month Depo-Lupron injection was in early September and he is due for another shot in early January.  Dr. Risa Grill will assume his care with Dr. Sammie Bench retirement.  He does not yet have an appointment to see Dr. Risa Grill.  Physical examination: Alert and oriented. Filed Vitals:   09/23/15 1136  BP: 117/66  Pulse: 66  Temp: 97.8 F (36.6 C)   Rectal examination not performed today.  Impression: Satisfactory progress.  I expect that Dr. Risa Grill will see him no later than early next year.  He is scheduled for his next Lupron injection in early January.  Dr. Risa Grill will obtain his follow-up PSA determinations.  Plan: As above.  I've not scheduled [name] for a formal follow-up visit, and we ask that Dr. Risa Grill keep [place] posted on his progress.

## 2015-09-23 NOTE — Progress Notes (Signed)
Edward [name] here for reassessment s/p radiation therapy to his pelvis for prostate cancer.  He denies any pain upon urination, and reports good stream, non burning, and complete emptying. Nocturia 3-4 times.  Denies any rectal irritation, nor any diarrhea.

## 2015-09-23 NOTE — Progress Notes (Signed)
Oncology Nurse Navigator Documentation  Oncology Nurse Navigator Flowsheets 07/29/2015 08/19/2015 09/23/2015  Referral date to RadOnc/MedOnc - - -  Navigator Encounter Type Treatment Treatment -  Patient Visit Type Radonc Radonc Radonc- Follow up appointment with Dr. Valere Dross. States he is doing well and his fatigue is much improved. Will continue to follow.  Treatment Phase Treatment Final Radiation Tx Other  Barriers/Navigation Needs No barriers at this time No barriers at this time No barriers at this time  Support Groups/Services Friends and Family Friends and Family Friends and Family  Time Spent with Patient 44 30 15

## 2015-09-24 ENCOUNTER — Other Ambulatory Visit (INDEPENDENT_AMBULATORY_CARE_PROVIDER_SITE_OTHER): Payer: Medicare Other

## 2015-09-24 ENCOUNTER — Ambulatory Visit (INDEPENDENT_AMBULATORY_CARE_PROVIDER_SITE_OTHER): Payer: Medicare Other | Admitting: Internal Medicine

## 2015-09-24 ENCOUNTER — Encounter: Payer: Self-pay | Admitting: Internal Medicine

## 2015-09-24 VITALS — BP 120/70 | HR 62 | Ht 73.0 in | Wt 202.0 lb

## 2015-09-24 DIAGNOSIS — Z0000 Encounter for general adult medical examination without abnormal findings: Secondary | ICD-10-CM

## 2015-09-24 DIAGNOSIS — C61 Malignant neoplasm of prostate: Secondary | ICD-10-CM

## 2015-09-24 DIAGNOSIS — R269 Unspecified abnormalities of gait and mobility: Secondary | ICD-10-CM

## 2015-09-24 DIAGNOSIS — R609 Edema, unspecified: Secondary | ICD-10-CM | POA: Diagnosis not present

## 2015-09-24 DIAGNOSIS — E119 Type 2 diabetes mellitus without complications: Secondary | ICD-10-CM

## 2015-09-24 DIAGNOSIS — I10 Essential (primary) hypertension: Secondary | ICD-10-CM

## 2015-09-24 DIAGNOSIS — N183 Chronic kidney disease, stage 3 unspecified: Secondary | ICD-10-CM

## 2015-09-24 DIAGNOSIS — R739 Hyperglycemia, unspecified: Secondary | ICD-10-CM

## 2015-09-24 DIAGNOSIS — E559 Vitamin D deficiency, unspecified: Secondary | ICD-10-CM

## 2015-09-24 DIAGNOSIS — R202 Paresthesia of skin: Secondary | ICD-10-CM

## 2015-09-24 LAB — BASIC METABOLIC PANEL
BUN: 31 mg/dL — ABNORMAL HIGH (ref 6–23)
CALCIUM: 9.3 mg/dL (ref 8.4–10.5)
CO2: 30 mEq/L (ref 19–32)
Chloride: 101 mEq/L (ref 96–112)
Creatinine, Ser: 1.92 mg/dL — ABNORMAL HIGH (ref 0.40–1.50)
GFR: 43.52 mL/min — AB (ref >60.00)
Glucose, Bld: 135 mg/dL — ABNORMAL HIGH (ref 70–99)
POTASSIUM: 4.6 meq/L (ref 3.5–5.1)
SODIUM: 140 meq/L (ref 135–145)

## 2015-09-24 LAB — LIPID PANEL
CHOL/HDL RATIO: 4
Cholesterol: 152 mg/dL (ref 0–200)
HDL: 34.1 mg/dL — AB (ref >39.00)
LDL Cholesterol: 88 mg/dL (ref 0–99)
NONHDL: 118.04
TRIGLYCERIDES: 151 mg/dL — AB (ref 0.0–149.0)
VLDL: 30.2 mg/dL (ref 0.0–40.0)

## 2015-09-24 LAB — HEPATIC FUNCTION PANEL
ALK PHOS: 35 U/L — AB (ref 39–117)
ALT: 23 U/L (ref 0–53)
AST: 19 U/L (ref 0–37)
Albumin: 4.2 g/dL (ref 3.5–5.2)
BILIRUBIN DIRECT: 0.1 mg/dL (ref 0.0–0.3)
Total Bilirubin: 0.6 mg/dL (ref 0.2–1.2)
Total Protein: 6.8 g/dL (ref 6.0–8.3)

## 2015-09-24 LAB — CBC WITH DIFFERENTIAL/PLATELET
BASOS PCT: 0.6 % (ref 0.0–3.0)
Basophils Absolute: 0 10*3/uL (ref 0.0–0.1)
EOS ABS: 0.1 10*3/uL (ref 0.0–0.7)
Eosinophils Relative: 2.2 % (ref 0.0–5.0)
HCT: 37.5 % — ABNORMAL LOW (ref 39.0–52.0)
Hemoglobin: 12.8 g/dL — ABNORMAL LOW (ref 13.0–17.0)
LYMPHS ABS: 0.9 10*3/uL (ref 0.7–4.0)
Lymphocytes Relative: 19.8 % (ref 12.0–46.0)
MCHC: 34 g/dL (ref 30.0–36.0)
MCV: 97.7 fl (ref 78.0–100.0)
MONO ABS: 0.4 10*3/uL (ref 0.1–1.0)
Monocytes Relative: 9.4 % (ref 3.0–12.0)
NEUTROS ABS: 3.2 10*3/uL (ref 1.4–7.7)
NEUTROS PCT: 68 % (ref 43.0–77.0)
PLATELETS: 178 10*3/uL (ref 150.0–400.0)
RBC: 3.84 Mil/uL — ABNORMAL LOW (ref 4.22–5.81)
RDW: 12.7 % (ref 11.5–15.5)
WBC: 4.7 10*3/uL (ref 4.0–10.5)

## 2015-09-24 LAB — HEMOGLOBIN A1C: HEMOGLOBIN A1C: 6.3 % (ref 4.6–6.5)

## 2015-09-24 LAB — TSH: TSH: 1.34 u[IU]/mL (ref 0.35–4.50)

## 2015-09-24 LAB — VITAMIN D 25 HYDROXY (VIT D DEFICIENCY, FRACTURES): VITD: 60.33 ng/mL (ref 30.00–100.00)

## 2015-09-24 LAB — VITAMIN B12: Vitamin B-12: 506 pg/mL (ref 211–911)

## 2015-09-24 MED ORDER — OLMESARTAN MEDOXOMIL 40 MG PO TABS: 40.0000 mg | ORAL_TABLET | Freq: Every day | ORAL | Status: DC

## 2015-09-24 MED ORDER — VITAMIN D (ERGOCALCIFEROL) 1.25 MG (50000 UNIT) PO CAPS: 50000.0000 [IU] | ORAL_CAPSULE | ORAL | Status: DC

## 2015-09-24 MED ORDER — TRIAMTERENE-HCTZ 37.5-25 MG PO CAPS: 1.0000 | ORAL_CAPSULE | Freq: Every morning | ORAL | Status: DC

## 2015-09-24 MED ORDER — FUROSEMIDE 20 MG PO TABS: 20.0000 mg | ORAL_TABLET | Freq: Every day | ORAL | Status: DC | PRN

## 2015-09-24 MED ORDER — FOLIC ACID 1 MG PO TABS: 1.0000 mg | ORAL_TABLET | Freq: Every day | ORAL | Status: DC

## 2015-09-24 MED ORDER — NIACIN ER (ANTIHYPERLIPIDEMIC) 500 MG PO TBCR: 500.0000 mg | EXTENDED_RELEASE_TABLET | Freq: Every day | ORAL | Status: DC

## 2015-09-24 NOTE — Progress Notes (Signed)
Subjective:  Patient ID: Edward [name], male    DOB: 1933-03-09  Age: 81 y.o. MRN: [id number]  CC: No chief complaint on file.   HPI Edward [name] presents for a well exam. C/o unsteady gait. Personality has changed per wife - apathy. Just had XRT to prostate  Outpatient Prescriptions Prior to Visit  Medication Sig Dispense Refill  . aspirin 81 MG chewable tablet Chew 81 mg by mouth daily.    . Cholecalciferol (VITAMIN D-3) 1000 UNITS CAPS Take 1 capsule by mouth every morning.    . Multiple Vitamins-Minerals (CENTRUM SILVER) tablet Take 1 tablet by mouth daily.     . folic acid (FOLVITE) 1 MG tablet Take 1 tablet (1 mg total) by mouth daily. 90 tablet 3  . furosemide (LASIX) 20 MG tablet Take 1-2 tablets (20-40 mg total) by mouth daily as needed for edema. 90 tablet 3  . niacin (NIASPAN) 500 MG CR tablet Take 1 tablet (500 mg total) by mouth daily. 90 tablet 3  . olmesartan (BENICAR) 40 MG tablet Take 1 tablet (40 mg total) by mouth daily. 90 tablet 3  . triamterene-hydrochlorothiazide (DYAZIDE) 37.5-25 MG per capsule Take 1 each (1 capsule total) by mouth every morning. 90 capsule 3  . Vitamin D, Ergocalciferol, (DRISDOL) 50000 UNITS CAPS capsule Take 1 capsule (50,000 Units total) by mouth every 30 (thirty) days. First of the month 3 capsule 3   No facility-administered medications prior to visit.    ROS Review of Systems  Constitutional: Negative for appetite change, fatigue and unexpected weight change.  HENT: Negative for congestion, nosebleeds, sneezing, sore throat and trouble swallowing.   Eyes: Negative for itching and visual disturbance.  Respiratory: Negative for cough.   Cardiovascular: Negative for chest pain, palpitations and leg swelling.  Gastrointestinal: Negative for nausea, diarrhea, blood in stool and abdominal distention.  Genitourinary: Positive for frequency. Negative for hematuria.  Musculoskeletal: Positive for gait problem. Negative for back pain,  joint swelling and neck pain.  Skin: Negative for rash.  Neurological: Negative for dizziness, tremors, speech difficulty and weakness.  Psychiatric/Behavioral: Positive for sleep disturbance. Negative for dysphoric mood and agitation. The patient is not nervous/anxious.     Objective:  BP 120/70 mmHg  Pulse 62  Ht 6\' 1"  (1.854 m)  Wt 202 lb (91.627 kg)  BMI 26.66 kg/m2  SpO2 97%  BP Readings from Last 3 Encounters:  09/24/15 120/70  09/23/15 117/66  08/18/15 112/64    Wt Readings from Last 3 Encounters:  09/24/15 202 lb (91.627 kg)  09/23/15 203 lb (92.08 kg)  08/18/15 206 lb 6.4 oz (93.622 kg)    Physical Exam  Constitutional: He is oriented to person, place, and time. He appears well-developed. No distress.  NAD  HENT:  Mouth/Throat: Oropharynx is clear and moist.  Eyes: Conjunctivae are normal. Pupils are equal, round, and reactive to light.  Neck: Normal range of motion. No JVD present. No thyromegaly present.  Cardiovascular: Normal rate, regular rhythm, normal heart sounds and intact distal pulses.  Exam reveals no gallop and no friction rub.   No murmur heard. Pulmonary/Chest: Effort normal and breath sounds normal. No respiratory distress. He has no wheezes. He has no rales. He exhibits no tenderness.  Abdominal: Soft. Bowel sounds are normal. He exhibits no distension and no mass. There is no tenderness. There is no rebound and no guarding.  Musculoskeletal: Normal range of motion. He exhibits edema. He exhibits no tenderness.  Lymphadenopathy:    He  has no cervical adenopathy.  Neurological: He is alert and oriented to person, place, and time. He has normal reflexes. No cranial nerve deficit. He exhibits normal muscle tone. He displays a negative Romberg sign. Coordination abnormal. Gait normal.  Skin: Skin is warm and dry. No rash noted.  Psychiatric: He has a normal mood and affect. His behavior is normal. Judgment and thought content normal.  trace ankle edema  B L knee w/a scar LLE limp - not putting any wt on a heel  Lab Results  Component Value Date   WBC 12.4* 10/30/2013   HGB 15.7 10/30/2013   HCT 45.2 10/30/2013   PLT 152 10/30/2013   GLUCOSE 128* 10/03/2014   CHOL 131 10/03/2013   TRIG 148.0 10/03/2013   HDL 32.90* 10/03/2013   LDLCALC 69 10/03/2013   ALT 27 10/03/2014   AST 24 10/03/2014   NA 139 10/03/2014   K 4.6 10/03/2014   CL 101 10/03/2014   CREATININE 1.9* 10/03/2014   BUN 31* 10/03/2014   CO2 30 10/03/2014   TSH 1.99 10/03/2013   PSA 15.38* 09/29/2012   HGBA1C 6.6* 10/03/2014    No results found.  Assessment & Plan:   Diagnoses and all orders for this visit:  Well adult exam -     TSH; Future -     CBC with Differential/Platelet; Future -     Basic metabolic panel; Future -     Hemoglobin A1c; Future -     Hepatic function panel; Future -     Lipid panel; Future -     Vit D  25 hydroxy (rtn osteoporosis monitoring); Future -     Vitamin B12; Future  CRF (chronic renal failure), stage 3 (moderate) -     TSH; Future -     CBC with Differential/Platelet; Future -     Basic metabolic panel; Future -     Hemoglobin A1c; Future -     Hepatic function panel; Future -     Lipid panel; Future -     Vit D  25 hydroxy (rtn osteoporosis monitoring); Future -     Vitamin B12; Future  Diet-controlled type 2 diabetes mellitus (HCC) -     TSH; Future -     CBC with Differential/Platelet; Future -     Basic metabolic panel; Future -     Hemoglobin A1c; Future -     Hepatic function panel; Future -     Lipid panel; Future -     Vit D  25 hydroxy (rtn osteoporosis monitoring); Future -     Vitamin B12; Future  Essential hypertension -     TSH; Future -     CBC with Differential/Platelet; Future -     Basic metabolic panel; Future -     Hemoglobin A1c; Future -     Hepatic function panel; Future -     Lipid panel; Future -     Vit D  25 hydroxy (rtn osteoporosis monitoring); Future -     Vitamin B12;  Future  Prostate cancer ([place]) -     TSH; Future -     CBC with Differential/Platelet; Future -     Basic metabolic panel; Future -     Hemoglobin A1c; Future -     Hepatic function panel; Future -     Lipid panel; Future -     Vit D  25 hydroxy (rtn osteoporosis monitoring); Future -     Vitamin B12; Future  Edema, unspecified type -     TSH; Future -     CBC with Differential/Platelet; Future -     Basic metabolic panel; Future -     Hemoglobin A1c; Future -     Hepatic function panel; Future -     Lipid panel; Future -     Vit D  25 hydroxy (rtn osteoporosis monitoring); Future -     Vitamin B12; Future  Gait disorder -     Ambulatory referral to Sports Medicine -     TSH; Future -     CBC with Differential/Platelet; Future -     Basic metabolic panel; Future -     Hemoglobin A1c; Future -     Hepatic function panel; Future -     Lipid panel; Future -     Vit D  25 hydroxy (rtn osteoporosis monitoring); Future -     Vitamin B12; Future  Vitamin D deficiency -     Vit D  25 hydroxy (rtn osteoporosis monitoring); Future  Paresthesia -     Vitamin B12; Future  Other orders -     Vitamin D, Ergocalciferol, (DRISDOL) 50000 UNITS CAPS capsule; Take 1 capsule (50,000 Units total) by mouth every 30 (thirty) days. First of the month -     olmesartan (BENICAR) 40 MG tablet; Take 1 tablet (40 mg total) by mouth daily. -     folic acid (FOLVITE) 1 MG tablet; Take 1 tablet (1 mg total) by mouth daily. -     furosemide (LASIX) 20 MG tablet; Take 1-2 tablets (20-40 mg total) by mouth daily as needed for edema. -     niacin (NIASPAN) 500 MG CR tablet; Take 1 tablet (500 mg total) by mouth daily. -     triamterene-hydrochlorothiazide (DYAZIDE) 37.5-25 MG capsule; Take 1 each (1 capsule total) by mouth every morning.   I have changed Mr. Merkin's triamterene-hydrochlorothiazide. I am also having him maintain his CENTRUM SILVER, aspirin, Vitamin D-3, Vitamin D (Ergocalciferol),  olmesartan, folic acid, furosemide, and niacin.  Meds ordered this encounter  Medications  . Vitamin D, Ergocalciferol, (DRISDOL) 50000 UNITS CAPS capsule    Sig: Take 1 capsule (50,000 Units total) by mouth every 30 (thirty) days. First of the month    Dispense:  3 capsule    Refill:  3  . olmesartan (BENICAR) 40 MG tablet    Sig: Take 1 tablet (40 mg total) by mouth daily.    Dispense:  90 tablet    Refill:  3  . folic acid (FOLVITE) 1 MG tablet    Sig: Take 1 tablet (1 mg total) by mouth daily.    Dispense:  90 tablet    Refill:  3  . furosemide (LASIX) 20 MG tablet    Sig: Take 1-2 tablets (20-40 mg total) by mouth daily as needed for edema.    Dispense:  90 tablet    Refill:  3  . niacin (NIASPAN) 500 MG CR tablet    Sig: Take 1 tablet (500 mg total) by mouth daily.    Dispense:  90 tablet    Refill:  3  . triamterene-hydrochlorothiazide (DYAZIDE) 37.5-25 MG capsule    Sig: Take 1 each (1 capsule total) by mouth every morning.    Dispense:  90 capsule    Refill:  3     Follow-up: Return in about 3 months (around 12/25/2015) for a follow-up visit.  Walker Kehr, MD

## 2015-09-24 NOTE — Assessment & Plan Note (Signed)
On Triamt-HCT

## 2015-09-24 NOTE — Progress Notes (Signed)
Pre visit review using our clinic review tool, if applicable. No additional management support is needed unless otherwise documented below in the visit note. 

## 2015-09-24 NOTE — Assessment & Plan Note (Signed)
Here for medicare wellness/physical  Diet: heart healthy  Physical activity: not sedentary  Depression/mood screen: negative  Hearing: intact to whispered voice  Visual acuity: grossly normal, performs annual eye exam  ADLs: capable  Fall risk:moderate - LLE gives out Home safety: good  Cognitive evaluation: intact to orientation, naming, recall and repetition  EOL planning: adv directives, full code/ I agree  I have personally reviewed and have noted  1. The patient's medical, surgical and social history  2. Their use of alcohol, tobacco or illicit drugs  3. Their current medications and supplements  4. The patient's functional ability including ADL's, fall risks, home safety risks and hearing or visual impairment.  5. Diet and physical activities  6. Evidence for depression or mood disorders 7. The roster of all physicians providing medical care to patient - is listed in the Snapshot section of the chart and reviewed today.    Today patient counseled on age appropriate routine health concerns for screening and prevention, each reviewed and up to date or declined. Immunizations reviewed and up to date or declined. Labs ordered and reviewed. Risk factors for depression reviewed and negative. Hearing function and visual acuity are intact. ADLs screened and addressed as needed. Functional ability and level of safety reviewed and appropriate. Education, counseling and referrals performed based on assessed risks today. Patient provided with a copy of personalized plan for preventive services.

## 2015-09-24 NOTE — Assessment & Plan Note (Signed)
Labs

## 2015-09-24 NOTE — Patient Instructions (Signed)

## 2015-09-24 NOTE — Assessment & Plan Note (Signed)
LLE weakness, s/p TKR 2014, L quad tear 2014 Sch appt Dr Martha Clan use suggesed

## 2015-09-24 NOTE — Assessment & Plan Note (Signed)
2016 XRT x40

## 2015-09-25 ENCOUNTER — Encounter: Payer: Self-pay | Admitting: Family Medicine

## 2015-09-25 ENCOUNTER — Other Ambulatory Visit (INDEPENDENT_AMBULATORY_CARE_PROVIDER_SITE_OTHER): Payer: Medicare Other

## 2015-09-25 ENCOUNTER — Ambulatory Visit (INDEPENDENT_AMBULATORY_CARE_PROVIDER_SITE_OTHER): Payer: Medicare Other | Admitting: Family Medicine

## 2015-09-25 VITALS — BP 120/70 | HR 83 | Ht 73.0 in | Wt 201.0 lb

## 2015-09-25 DIAGNOSIS — M79605 Pain in left leg: Secondary | ICD-10-CM

## 2015-09-25 DIAGNOSIS — IMO0002 Reserved for concepts with insufficient information to code with codable children: Secondary | ICD-10-CM

## 2015-09-25 DIAGNOSIS — M129 Arthropathy, unspecified: Secondary | ICD-10-CM

## 2015-09-25 NOTE — Progress Notes (Signed)
[name] Edward [name] Edward [name], Edward [name] [address] Phone: [phone] Subjective:    I'm seeing this patient by the request  of:  Edward Kehr, MD   CC: Left leg pain  Edward [name] is a 81 y.o. male coming in with complaint of left leg pain. Patient in 2013 did have a quadriceps tendon repair. Patient unfortunate since last time of seeing him in 2015 did have prostate cancer and has been going for treatment for radiation. Patient has just finished his last radiation. Patient states that unfortunately is having left leg pain. Seems to be worse with activity. Seems to be more on the medial aspect of his knee and does feel unstable. Patient states that it also feels like it can give out on him. When he is walking a lot he does use a cane. Patient has tried wearing the medial unloader brace that we've given him previously greater than year ago with no significant improvement. Patient rates the severity of pain a 7 out of 10. Patient is concerned because it does not seem to be improving.      Past Medical History  Diagnosis Date  . Hyperlipidemia   . Hypertension   . Elevated glucose   . Venous insufficiency of leg   . Drug-induced low platelet count     from NSAIDS  . Prostate cancer ([place])   . Complex renal cyst RIGHT--  MONITORED BY Edward Edward [name]  . Chronic renal insufficiency     Edward [name]  . Nocturia   . Right knee injury     In past  . Crush injury, back   . Back pain     Difficulty walking- left leg pain  . Wears glasses   . Hearing loss   . History of swelling of feet   . Wears dentures     partial  . S/P radiation therapy 06/25/2015 through 08/19/2015    Prostate 7800 cGy in 40 sessions, seminal vesicles 5600 cGy in 40 sessions    Past Surgical History  Procedure Laterality Date  . Cryoablation  12/01/2012    Procedure: CRYO ABLATION PROSTATE;  Surgeon: Edward Ben, MD;  Location: [facility];  Service: Urology;  Laterality: N/A;  . Quadriceps tendon repair Left 10/31/2013    Procedure: Left Quadriceps Tendon Repair;  Surgeon: Edward Killings, MD;  Location: [facility];  Service: Orthopedics;  Laterality: Left;  Left Quadriceps Tendon Repair  . Repair knee ligament Left 2014  . Colonoscopy  2/77/11   Social History  Substance Use Topics  . Smoking status: Never Smoker   . Smokeless tobacco: Never Used  . Alcohol Use: No   Allergies  Allergen Reactions  . Naproxen Sodium     REACTION: low plt     Past medical history, social, surgical and family history all reviewed in electronic medical record.   Review of Systems: No headache, visual changes, nausea, vomiting, diarrhea, constipation, dizziness, abdominal pain, skin rash, fevers, chills, night sweats, weight loss, swollen lymph nodes, body aches, joint swelling, muscle aches, chest pain, shortness of breath, mood changes.   Objective Blood pressure 120/70, pulse 83, height 6\' 1"  (1.854 m), weight 201 lb (91.173 kg), SpO2 97 %.  General: No apparent distress alert and oriented x3 mood and affect normal, dressed appropriately.  HEENT: Pupils equal, extraocular movements intact  Respiratory: Patient's speak in full sentences and does not appear short of breath  Cardiovascular: No lower extremity edema,  non tender, no erythema  Skin: Warm dry intact with no signs of infection or rash on extremities or on axial skeleton.  Abdomen: Soft nontender  Neuro: Cranial nerves II through XII are intact, neurovascularly intact in all extremities. Very mild peripheral neuropathy in all limbs. with 2+ DTRs and 2+ pulses.  Lymph: No lymphadenopathy of posterior or anterior cervical chain or axillae bilaterally.  Gait patient does have some laxity of the knee with varus deformity an antalgic gait MSK:  Non tender with full range of motion and good stability and symmetric strength and tone of shoulders, elbows, wrist, hip, and ankles  bilaterally.  Knee: Left Varus deformity of the knee noted Tender to palpation over the medial joint line Patient does have laxity with valgus force over the medial MCL. Positive Mcmurray's, Apley's, and Thessalonian tests. Non painful patellar compression. Patellar glide with mild crepitus. Patellar and quadriceps tendons unremarkable. Hamstring and quadriceps strength is normal.  Contralateral knee has mild varus deformity  MSK US performed of: Left knee This study was ordered, performed, and interpreted by Edward [name] D.O.  Knee: All structures visualized. Patient does have chronic meniscal injury that is approximate 65% of the meniscus with mild protrusion on the medial aspect. No loose body appreciated. Patellar Tendon unremarkable on long and transverse views without effusion. Quadriceps tendon postsurgical changes noted with mild hypoechoic changes noted. No abnormality of prepatellar bursa. Degenerative changes of the MCL noted  IMPRESSION:  Moderate osteophytic changes of the medial compartment with chronic meniscal tear  After informed written and verbal consent, patient was seated on exam table. Left knee was prepped with alcohol swab and utilizing anterolateral approach, patient's left knee space was injected with 4:1  marcaine 0.5%: Kenalog 40mg /dL. Patient tolerated the procedure well without immediate complications.  Procedure note 10071; 15 minutes spent for Therapeutic exercises as stated in above notes.  This included exercises focusing on stretching, strengthening, with significant focus on eccentric aspects. Patient given vastus medialis oblique strengthening and hip abductor strengthening. Discussed hamstring stretching as well.  Proper technique shown and discussed handout in great detail with ATC.  All questions were discussed and answered.     Impression and Recommendations:     This case required medical decision making of moderate complexity.

## 2015-09-25 NOTE — Progress Notes (Signed)
Pre visit review using our clinic review tool, if applicable. No additional management support is needed unless otherwise documented below in the visit note. 

## 2015-09-25 NOTE — Assessment & Plan Note (Signed)
Patient given an injection today and tolerated the procedure very well. I do think that patient may have some mild peripheral neuropathy that likely secondary to the radiation. Patient will try some supplementations and see if this will be beneficial. Patient does not have any weakness of the lower extremity. Patient will need a stability brace for this knee. If patient response to the injection we can repeat this every 3-4 months. Patient given home exercises and consulted athletic trainer. Patient has any worsening symptoms or no significant improvement within 3 months he would be a candidate for viscous supplementation. We will see patient back again in 3-4 weeks for further evaluation and treatment.

## 2015-09-25 NOTE — Patient Instructions (Addendum)
Good to see you.  Ice 20 minutes 2 times daily. Usually after activity and before bed. Exercises 3 times a week.  pennsaid pinkie amount topically 2 times daily as needed.  We will get you a brace Vitamin D 2000 IU daily B12 1041mcg daily B6 200mg  daily See me again in 3 weeks.

## 2015-10-16 ENCOUNTER — Encounter: Payer: Self-pay | Admitting: Family Medicine

## 2015-10-16 ENCOUNTER — Ambulatory Visit (INDEPENDENT_AMBULATORY_CARE_PROVIDER_SITE_OTHER): Payer: Medicare Other | Admitting: Family Medicine

## 2015-10-16 ENCOUNTER — Other Ambulatory Visit (INDEPENDENT_AMBULATORY_CARE_PROVIDER_SITE_OTHER): Payer: Medicare Other

## 2015-10-16 VITALS — BP 114/62 | HR 72 | Ht 73.0 in | Wt 201.0 lb

## 2015-10-16 DIAGNOSIS — M545 Low back pain: Secondary | ICD-10-CM

## 2015-10-16 DIAGNOSIS — G8929 Other chronic pain: Secondary | ICD-10-CM

## 2015-10-16 DIAGNOSIS — M129 Arthropathy, unspecified: Secondary | ICD-10-CM | POA: Diagnosis not present

## 2015-10-16 DIAGNOSIS — M25562 Pain in left knee: Secondary | ICD-10-CM | POA: Diagnosis not present

## 2015-10-16 DIAGNOSIS — IMO0002 Reserved for concepts with insufficient information to code with codable children: Secondary | ICD-10-CM

## 2015-10-16 MED ORDER — GABAPENTIN 100 MG PO CAPS: 100.0000 mg | ORAL_CAPSULE | Freq: Every day | ORAL | Status: DC

## 2015-10-16 NOTE — Assessment & Plan Note (Signed)
Patient does have chronic osteophytic changes. Was able to review his most recent bone scan from his prostate cancer. Severe arthritis at multiple levels. Would consider further workup if he continues to have trouble to rule out something such as spinal stenosis at be contributing to some of the weakness in the legs. We will monitor closely. Patient will start on a very low dose of gabapentin at night. Return to 6 weeks.

## 2015-10-16 NOTE — Assessment & Plan Note (Signed)
Patient is doing remarkably well after injection. We discussed with him that we can repeat this every 3-4 months. Patient is also a candidate for viscous supplementation if necessary. Patient is wearing the custom brace and is doing very well. I do feel that some of the low back pain is giving him some daily disorder. We discussed with patient about the possibility of this being more radicular symptoms and patient will be started on gabapentin. Patient and will follow-up with me again in 6 weeks for further evaluation and treatment.

## 2015-10-16 NOTE — Progress Notes (Signed)
[name] [address] [place], [place] [address] Phone: [phone] Subjective:    I'm seeing this patient by the request  of:  Walker Kehr, MD   CC: Left leg pain  [id number] Edward [name] is a 81 y.o. male coming in with complaint of left leg pain. Patient in 2013 did have a quadriceps tendon repair. Patient unfortunate since last time of seeing him in 2015 did have prostate cancer and has been going for treatment for radiation.  Patient saw me previously and was diagnosed with moderate osteophytic changes of the knee as well as a chronic meniscal tear. Patient elected do have an injection as well as try conservative therapy. Patient states he also got a custom brace and is wearing it daily. States that this has been helpful. Does note some mild increase in back pain. Patient denies though any radiation down the leg that he would state. States that if he stands too long ago his legs do feel weak.     Past Medical History  Diagnosis Date  . Hyperlipidemia   . Hypertension   . Elevated glucose   . Venous insufficiency of leg   . Drug-induced low platelet count     from NSAIDS  . Prostate cancer ([place])   . Complex renal cyst RIGHT--  MONITORED BY DR Risa Grill  . Chronic renal insufficiency     DR WEBB  . Nocturia   . Right knee injury     In past  . Crush injury, back   . Back pain     Difficulty walking- left leg pain  . Wears glasses   . Hearing loss   . History of swelling of feet   . Wears dentures     partial  . S/P radiation therapy 06/25/2015 through 08/19/2015    Prostate 7800 cGy in 40 sessions, seminal vesicles 5600 cGy in 40 sessions    Past Surgical History  Procedure Laterality Date  . Cryoablation  12/01/2012    Procedure: CRYO ABLATION PROSTATE;  Surgeon: Hanley Ben, MD;  Location: [facility];  Service: Urology;  Laterality: N/A;  . Quadriceps tendon repair Left 10/31/2013    Procedure: Left Quadriceps Tendon Repair;  Surgeon: Marybelle Killings, MD;  Location: [facility];  Service: Orthopedics;  Laterality: Left;  Left Quadriceps Tendon Repair  . Repair knee ligament Left 2014  . Colonoscopy  2/77/11   Social History  Substance Use Topics  . Smoking status: Never Smoker   . Smokeless tobacco: Never Used  . Alcohol Use: No   Allergies  Allergen Reactions  . Naproxen Sodium     REACTION: low plt     Past medical history, social, surgical and family history all reviewed in electronic medical record.   Review of Systems: No headache, visual changes, nausea, vomiting, diarrhea, constipation, dizziness, abdominal pain, skin rash, fevers, chills, night sweats, weight loss, swollen lymph nodes, body aches, joint swelling, muscle aches, chest pain, shortness of breath, mood changes.   Objective Blood pressure 114/62, pulse 72, height 6\' 1"  (1.854 m), weight 201 lb (91.173 kg), SpO2 98 %.  General: No apparent distress alert and oriented x3 mood and affect normal, dressed appropriately.  HEENT: Pupils equal, extraocular movements intact  Respiratory: Patient's speak in full sentences and does not appear short of breath  Cardiovascular: No lower extremity edema, non tender, no erythema  Skin: Warm dry intact with no signs of infection or rash on extremities or on  axial skeleton.  Abdomen: Soft nontender  Neuro: Cranial nerves II through XII are intact, neurovascularly intact in all extremities. Very mild peripheral neuropathy in all limbs. with 2+ DTRs and 2+ pulses.  Lymph: No lymphadenopathy of posterior or anterior cervical chain or axillae bilaterally.  Gait patient does have some laxity of the knee with varus deformity an antalgic gait MSK:  Non tender with full range of motion and good stability and symmetric strength and tone of shoulders, elbows, wrist, hip, and ankles bilaterally.  Knee: Left Varus deformity of the knee noted Less tenderness to palpation over the  medial joint line Patient does have laxity with valgus force over the medial MCL. Positive Mcmurray's, Apley's, and Thessalonian tests. Non painful patellar compression. Patellar glide with mild crepitus. Patellar and quadriceps tendons unremarkable. Hamstring and quadriceps strength is normal.  Contralateral knee has mild varus deformity       Impression and Recommendations:     This case required medical decision making of moderate complexity.

## 2015-10-16 NOTE — Patient Instructions (Addendum)
Good to see you Gabapentin 100mg  at night Ice is you rfriend Stay active pennsaid pinkie amount topically 2 times daily as needed.  Continue the vitamins See me again in 6 weeks.

## 2015-10-16 NOTE — Progress Notes (Signed)
Pre visit review using our clinic review tool, if applicable. No additional management support is needed unless otherwise documented below in the visit note. 

## 2015-11-19 ENCOUNTER — Telehealth: Payer: Self-pay | Admitting: Medical Oncology

## 2015-11-19 NOTE — Telephone Encounter (Signed)
Oncology Nurse Navigator Documentation  Oncology Nurse Navigator Flowsheets 08/19/2015 09/23/2015 11/19/2015  Referral date to RadOnc/MedOnc - - -  Navigator Encounter Type Treatment - Telephone;3 month-spoke with Mrs. Bussie and she states her husband is doing well. He has fully recovered from his radiation. He continues to have trouble with his knee but he is seeing the orthopedic physician. She thanked me for calling to check on him.  He will follow up with Dr. Risa Grill in January.I asked them to call me with any questions or concerns. She voiced understanding.  Patient Visit Type Radonc Radonc -  Treatment Phase Final Radiation Tx Other -  Barriers/Navigation Needs No barriers at this time No barriers at this time No barriers at this time  Support Groups/Services Friends and Family Friends and Family Friends and Family  Time Spent with Patient [id number]

## 2015-12-01 ENCOUNTER — Ambulatory Visit (INDEPENDENT_AMBULATORY_CARE_PROVIDER_SITE_OTHER)
Admission: RE | Admit: 2015-12-01 | Discharge: 2015-12-01 | Disposition: A | Discharge status: Home / Self Care | Payer: Medicare Other | Source: Ambulatory Visit | Attending: Family Medicine | Admitting: Family Medicine

## 2015-12-01 ENCOUNTER — Encounter: Payer: Self-pay | Admitting: Family Medicine

## 2015-12-01 ENCOUNTER — Ambulatory Visit (INDEPENDENT_AMBULATORY_CARE_PROVIDER_SITE_OTHER): Payer: Medicare Other | Admitting: Family Medicine

## 2015-12-01 VITALS — BP 128/60 | HR 75 | Ht 73.0 in | Wt 208.0 lb

## 2015-12-01 DIAGNOSIS — IMO0002 Reserved for concepts with insufficient information to code with codable children: Secondary | ICD-10-CM

## 2015-12-01 DIAGNOSIS — G8929 Other chronic pain: Secondary | ICD-10-CM

## 2015-12-01 DIAGNOSIS — M5136 Other intervertebral disc degeneration, lumbar region: Secondary | ICD-10-CM | POA: Diagnosis not present

## 2015-12-01 DIAGNOSIS — M129 Arthropathy, unspecified: Secondary | ICD-10-CM

## 2015-12-01 DIAGNOSIS — M545 Low back pain: Secondary | ICD-10-CM

## 2015-12-01 NOTE — Progress Notes (Signed)
Pre visit review using our clinic review tool, if applicable. No additional management support is needed unless otherwise documented below in the visit note. 

## 2015-12-01 NOTE — Progress Notes (Signed)
[name] [address] [place], [place] [address] Phone: [phone] Subjective:    I'm seeing this patient by the request  of:  Walker Kehr, MD   CC: Left leg pain follow up  [id number] Edward [name] is a 81 y.o. male coming in with complaint of left leg pain. Patient in 2013 did have a quadriceps tendon repair. Patient unfortunate since last time of seeing him in 2015 did have prostate cancer and has been going for treatment for radiation.  Patient saw me previously and was diagnosed with moderate osteophytic changes of the knee as well as a chronic meniscal tear. Patient elected do have an injection as well as try conservative therapy. Last injection was created in 10 weeks ago. Starting have mild increase in discomfort again. Patient is doing the vitamin D supplementation. Wearing the custom brace which has been somewhat helpful. Continues to have some difficulty with his gait and some weakness on this side. Does not want any surgical intervention.     Past Medical History  Diagnosis Date  . Hyperlipidemia   . Hypertension   . Elevated glucose   . Venous insufficiency of leg   . Drug-induced low platelet count     from NSAIDS  . Prostate cancer ([place])   . Complex renal cyst RIGHT--  MONITORED BY DR Risa Grill  . Chronic renal insufficiency     DR WEBB  . Nocturia   . Right knee injury     In past  . Crush injury, back   . Back pain     Difficulty walking- left leg pain  . Wears glasses   . Hearing loss   . History of swelling of feet   . Wears dentures     partial  . S/P radiation therapy 06/25/2015 through 08/19/2015    Prostate 7800 cGy in 40 sessions, seminal vesicles 5600 cGy in 40 sessions    Past Surgical History  Procedure Laterality Date  . Cryoablation  12/01/2012    Procedure: CRYO ABLATION PROSTATE;  Surgeon: Hanley Ben, MD;  Location: [facility];  Service: Urology;   Laterality: N/A;  . Quadriceps tendon repair Left 10/31/2013    Procedure: Left Quadriceps Tendon Repair;  Surgeon: Marybelle Killings, MD;  Location: [facility];  Service: Orthopedics;  Laterality: Left;  Left Quadriceps Tendon Repair  . Repair knee ligament Left 2014  . Colonoscopy  2/77/11   Social History  Substance Use Topics  . Smoking status: Never Smoker   . Smokeless tobacco: Never Used  . Alcohol Use: No   Allergies  Allergen Reactions  . Naproxen Sodium     REACTION: low plt     Past medical history, social, surgical and family history all reviewed in electronic medical record.   Review of Systems: No headache, visual changes, nausea, vomiting, diarrhea, constipation, dizziness, abdominal pain, skin rash, fevers, chills, night sweats, weight loss, swollen lymph nodes, body aches, joint swelling, muscle aches, chest pain, shortness of breath, mood changes.   Objective Blood pressure 128/60, pulse 75, height 6\' 1"  (1.854 m), weight 208 lb (94.348 kg), SpO2 98 %.  General: No apparent distress alert and oriented x3 mood and affect normal, dressed appropriately.  HEENT: Pupils equal, extraocular movements intact  Respiratory: Patient's speak in full sentences and does not appear short of breath  Cardiovascular: No lower extremity edema, non tender, no erythema  Skin: Warm dry intact with no signs of infection or rash on extremities  or on axial skeleton.  Abdomen: Soft nontender  Neuro: Cranial nerves II through XII are intact, neurovascularly intact in all extremities. Very mild peripheral neuropathy in all limbs. with 2+ DTRs and 2+ pulses.  Lymph: No lymphadenopathy of posterior or anterior cervical chain or axillae bilaterally.  Gait patient does have some laxity of the knee with varus deformity an antalgic gait stiff lumbar spine.  MSK:  Non tender with full range of motion and good stability and symmetric strength and tone of shoulders, elbows, wrist, hip, and ankles bilaterally.     Back exam shows the patient does have severe loss of motion of the lumbar spine. Has forward flexion of approximately 20 in extension of 5. Minimal rotation bilaterally but good side bending. Mild tenderness to palpation in the paraspinal musculature of the lumbar spine. Negative straight leg test bilaterally but patient does have some mild weakness of the left leg with 3 out of 5 strength compared to 4-5 strength on the contralateral side. Knee: Left Varus deformity of the knee noted Less tenderness to palpation over the medial joint line Patient does have laxity with valgus force over the medial MCL. Positive Mcmurray's, Apley's, and Thessalonian tests. Non painful patellar compression. Patellar glide with mild crepitus. Patellar and quadriceps tendons unremarkable. Hamstring and quadriceps strength is normal.  Contralateral knee has mild varus deformity  After informed written and verbal consent, patient was seated on exam table. Left knee was prepped with alcohol swab and utilizing anterolateral approach, patient's left knee space was injected with 4:1  marcaine 0.5%: Kenalog 40mg /dL. Patient tolerated the procedure well without immediate complications.      Impression and Recommendations:     This case required medical decision making of moderate complexity.

## 2015-12-01 NOTE — Assessment & Plan Note (Signed)
Severe chronic arthritis. We may need to consider further workup. Patient is having some weakness. There is a possibility that patient does have some spinal stenosis a could be contribute in. Does have some peripheral neuropathy. Patient is on a low dose of gabapentin. We will get x-rays today to further evaluate patient's arthritis. If worsening symptoms we may need to consider advanced imaging such as an MRI for further evaluation. Patient may need epidural steroid injections. We will discuss at follow-up in 3-4 weeks.

## 2015-12-01 NOTE — Patient Instructions (Signed)
Good to see you Ice is your friend Use the brace when doing a lot of activity.  We injected the knee and can repeat this every 3-4 months if needed.  If worsens before then we can consider orthovisc See me again in 4-5 weeks if not doing much better Happy holidays!
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
## 2015-12-23 DIAGNOSIS — C61 Malignant neoplasm of prostate: Secondary | ICD-10-CM | POA: Diagnosis not present

## 2015-12-25 ENCOUNTER — Other Ambulatory Visit (INDEPENDENT_AMBULATORY_CARE_PROVIDER_SITE_OTHER): Payer: Medicare Other

## 2015-12-25 ENCOUNTER — Ambulatory Visit (INDEPENDENT_AMBULATORY_CARE_PROVIDER_SITE_OTHER): Payer: Medicare Other | Admitting: Internal Medicine

## 2015-12-25 ENCOUNTER — Encounter: Payer: Self-pay | Admitting: Internal Medicine

## 2015-12-25 VITALS — BP 128/80 | HR 68 | Wt 206.0 lb

## 2015-12-25 DIAGNOSIS — M545 Low back pain: Secondary | ICD-10-CM

## 2015-12-25 DIAGNOSIS — N183 Chronic kidney disease, stage 3 unspecified: Secondary | ICD-10-CM

## 2015-12-25 DIAGNOSIS — I10 Essential (primary) hypertension: Secondary | ICD-10-CM

## 2015-12-25 DIAGNOSIS — R269 Unspecified abnormalities of gait and mobility: Secondary | ICD-10-CM

## 2015-12-25 DIAGNOSIS — E119 Type 2 diabetes mellitus without complications: Secondary | ICD-10-CM

## 2015-12-25 DIAGNOSIS — G8929 Other chronic pain: Secondary | ICD-10-CM

## 2015-12-25 DIAGNOSIS — C61 Malignant neoplasm of prostate: Secondary | ICD-10-CM

## 2015-12-25 LAB — BASIC METABOLIC PANEL
BUN: 39 mg/dL — ABNORMAL HIGH (ref 6–23)
CHLORIDE: 103 meq/L (ref 96–112)
CO2: 30 mEq/L (ref 19–32)
Calcium: 9.5 mg/dL (ref 8.4–10.5)
Creatinine, Ser: 1.85 mg/dL — ABNORMAL HIGH (ref 0.40–1.50)
GFR: 45.4 mL/min — ABNORMAL LOW (ref >60.00)
Glucose, Bld: 132 mg/dL — ABNORMAL HIGH (ref 70–99)
POTASSIUM: 5 meq/L (ref 3.5–5.1)
Sodium: 142 mEq/L (ref 135–145)

## 2015-12-25 LAB — HEMOGLOBIN A1C: HEMOGLOBIN A1C: 6.8 % — AB (ref 4.6–6.5)

## 2015-12-25 MED ORDER — METHYLPREDNISOLONE 4 MG PO TBPK: ORAL_TABLET | ORAL | Status: DC

## 2015-12-25 NOTE — Progress Notes (Signed)
Pre visit review using our clinic review tool, if applicable. No additional management support is needed unless otherwise documented below in the visit note. 

## 2015-12-25 NOTE — Assessment & Plan Note (Signed)
Dr Risa Grill next week. Pt had a PSA drawn
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
## 2015-12-25 NOTE — Assessment & Plan Note (Signed)
Chronic On Benicar, Dyazide Labs

## 2015-12-25 NOTE — Assessment & Plan Note (Addendum)
Chronic OA - poss spinal stenosis Dr Tamala Julian appt is pending MRI is considered [place] Predn taper given - medrol dose pack

## 2015-12-25 NOTE — Assessment & Plan Note (Signed)
Predn taper given - medrol dose pack

## 2015-12-25 NOTE — Assessment & Plan Note (Signed)
Labs

## 2015-12-25 NOTE — Assessment & Plan Note (Signed)
A1c

## 2015-12-31 DIAGNOSIS — C61 Malignant neoplasm of prostate: Secondary | ICD-10-CM | POA: Diagnosis not present

## 2016-01-05 ENCOUNTER — Ambulatory Visit (INDEPENDENT_AMBULATORY_CARE_PROVIDER_SITE_OTHER): Payer: Medicare Other | Admitting: Family Medicine

## 2016-01-05 ENCOUNTER — Encounter: Payer: Self-pay | Admitting: Family Medicine

## 2016-01-05 VITALS — BP 124/72 | HR 78 | Ht 73.0 in | Wt 207.0 lb

## 2016-01-05 DIAGNOSIS — G8929 Other chronic pain: Secondary | ICD-10-CM

## 2016-01-05 DIAGNOSIS — M5416 Radiculopathy, lumbar region: Secondary | ICD-10-CM

## 2016-01-05 DIAGNOSIS — M129 Arthropathy, unspecified: Secondary | ICD-10-CM | POA: Diagnosis not present

## 2016-01-05 DIAGNOSIS — M545 Low back pain: Secondary | ICD-10-CM

## 2016-01-05 DIAGNOSIS — IMO0002 Reserved for concepts with insufficient information to code with codable children: Secondary | ICD-10-CM

## 2016-01-05 NOTE — Assessment & Plan Note (Signed)
Overall patient is doing relatively well. Not having as much pain. Patient does have moderate osteophytic changes. Patient still has a significant antalgic gait. I do think is secondary to more of the weakness of the leg itself. I do think the patient has a limitation in range of motion. Patient does have a history of prostate cancer. X-rays have been stated above. We'll continue to monitor but patient would also be a candidate for viscous supplementation.

## 2016-01-05 NOTE — Patient Instructions (Signed)
Good to see you Ice when you need it I want you to get an MRI of your back to look for the spinal stenosis that could be giving you the weakness.  We will call you with the results and see if a epidural would be a good option.  If you have the epidural then see me 2 weeks after.  We will be talking

## 2016-01-05 NOTE — Progress Notes (Signed)
[name] [place], [place] [address] Phone: [phone] Subjective:    I'm seeing this patient by the request  of:  Walker Kehr, MD   CC: Left leg pain follow up  [id number] Edward [name] is a 82 y.o. male coming in with complaint of left leg pain. Patient in 2013 did have a quadriceps tendon repair. Patient unfortunate since last time of seeing him in 2015 did have prostate cancer and has been going for treatment for radiation.  Patient saw me previously and was diagnosed with moderate osteophytic changes of the knee as well as a chronic meniscal tear. Patient did have a second steroid injection at last follow-up. Patient states no pain at this time. Still has some mild instability. Wearing the brace occasionally. Patient states though that he is feeling better and is not as concerned with it.  Patient is also having some back pain. Considered severe. Patient has had some weakness in the legs as well. X-ray show moderate osteoarthritic changes which were independently visualized by me today. Patient states his back pain seems to be worse in the morning. Not waking him up at night. Seems to take longer to relax. Does use an over-the-counter back brace from time to time. Has noticed more weakness of the left leg recently. Once again does not state any pain that is completely debilitating. Past pedicle history is significant for prostate cancer.     Past Medical History  Diagnosis Date  . Hyperlipidemia   . Hypertension   . Elevated glucose   . Venous insufficiency of leg   . Drug-induced low platelet count     from NSAIDS  . Prostate cancer ([place])   . Complex renal cyst RIGHT--  MONITORED BY DR Risa Grill  . Chronic renal insufficiency     DR WEBB  . Nocturia   . Right knee injury     In past  . Crush injury, back   . Back pain     Difficulty walking- left leg pain  . Wears glasses   . Hearing loss   . History of swelling of feet    . Wears dentures     partial  . S/P radiation therapy 06/25/2015 through 08/19/2015    Prostate 7800 cGy in 40 sessions, seminal vesicles 5600 cGy in 40 sessions    Past Surgical History  Procedure Laterality Date  . Cryoablation  12/01/2012    Procedure: CRYO ABLATION PROSTATE;  Surgeon: Hanley Ben, MD;  Location: [facility];  Service: Urology;  Laterality: N/A;  . Quadriceps tendon repair Left 10/31/2013    Procedure: Left Quadriceps Tendon Repair;  Surgeon: Marybelle Killings, MD;  Location: [facility];  Service: Orthopedics;  Laterality: Left;  Left Quadriceps Tendon Repair  . Repair knee ligament Left 2014  . Colonoscopy  2/77/11   Social History  Substance Use Topics  . Smoking status: Never Smoker   . Smokeless tobacco: Never Used  . Alcohol Use: No   Allergies  Allergen Reactions  . Naproxen Sodium     REACTION: low plt     Past medical history, social, surgical and family history all reviewed in electronic medical record.   Review of Systems: No headache, visual changes, nausea, vomiting, diarrhea, constipation, dizziness, abdominal pain, skin rash, fevers, chills, night sweats, weight loss, swollen lymph nodes, body aches, joint swelling, muscle aches, chest pain, shortness of breath, mood changes.   Objective Blood pressure 124/72, pulse 78, weight  207 lb (93.895 kg), SpO2 94 %.  General: No apparent distress alert and oriented x3 mood and affect normal, dressed appropriately.  HEENT: Pupils equal, extraocular movements intact  Respiratory: Patient's speak in full sentences and does not appear short of breath  Cardiovascular: No lower extremity edema, non tender, no erythema  Skin: Warm dry intact with no signs of infection or rash on extremities or on axial skeleton.  Abdomen: Soft nontender  Neuro: Cranial nerves II through XII are intact, neurovascularly intact in all extremities. Very mild peripheral neuropathy in all limbs.  with 2+ DTRs and 2+ pulses.  Lymph: No lymphadenopathy of posterior or anterior cervical chain or axillae bilaterally.  Gait patient does have some laxity of the knee with varus deformity an antalgic gait stiff lumbar spine.  MSK:  Non tender with full range of motion and good stability and symmetric strength and tone of shoulders, elbows, wrist, hip, and ankles bilaterally.   Back exam shows the patient does have severe loss of motion of the lumbar spine. Has forward flexion of approximately 20 in extension of 5. Minimal rotation bilaterally but good side bending. Mild tenderness to palpation in the paraspinal musculature of the lumbar spine about stable from previous exam. Negative straight leg test bilaterally  the patient does have more tightness of the left hamstring compared to the right side. I would state that the weakness is somewhat worse with 3+ out of 5 on the right hip flexor. Deep tendon reflexes are intact   Knee: Left Varus deformity of the knee noted Nontender on exam Continued mild laxity of the MCL. Negative Mcmurray's, Apley's, and Thessalonian tests. Non painful patellar compression. Patellar glide with mild crepitus. Patellar and quadriceps tendons unremarkable. Hamstring and quadriceps strength is normal.  Contralateral knee has mild varus deformity        Impression and Recommendations:     This case required medical decision making of moderate complexity.

## 2016-01-05 NOTE — Assessment & Plan Note (Signed)
I do believe the patient has more of a spinal stenosis that is contributing to patient's weakness of the left leg. It seems to be getting worse over the course of time. Patient is not complaining of any significant pain at this time. I do feel though that with patient and how he is ambulating he is at an increased risk of fall. Patient does have the past medical history significant for prostate cancer do feel that advance imaging is necessary at this time. Patient has been monitored for complex renal cyst as well. I would like patient to have an MRI done to see if there is anything that could be contributing to this. Depending on findings an epidural steroid injection may be necessary. We discussed the possibility of formal physical therapy which patient declined. Patient has done some previously. Patient did have a past medical history significant for radiation therapy for the prostate cancer that can also give him a postradiation neuritis. Depending on findings we will adjust medical management accordingly. Continue the gabapentin at this time.  Spent  25 minutes with patient face-to-face and had greater than 50% of counseling including as described above in assessment and plan.

## 2016-01-05 NOTE — Progress Notes (Signed)
Pre visit review using our clinic review tool, if applicable. No additional management support is needed unless otherwise documented below in the visit note. 

## 2016-01-20 ENCOUNTER — Ambulatory Visit
Admission: RE | Admit: 2016-01-20 | Discharge: 2016-01-20 | Disposition: A | Discharge status: Home / Self Care | Payer: Medicare Other | Source: Ambulatory Visit | Attending: Family Medicine | Admitting: Family Medicine

## 2016-01-20 ENCOUNTER — Telehealth: Payer: Self-pay

## 2016-01-20 DIAGNOSIS — M5416 Radiculopathy, lumbar region: Secondary | ICD-10-CM

## 2016-01-20 DIAGNOSIS — M545 Low back pain: Secondary | ICD-10-CM | POA: Diagnosis not present

## 2016-01-20 NOTE — Telephone Encounter (Signed)
Attempted to contact patient, call cut off

## 2016-01-21 NOTE — Telephone Encounter (Signed)
Left message for patient to call back regarding MRI results

## 2016-02-05 ENCOUNTER — Other Ambulatory Visit: Payer: Self-pay

## 2016-02-05 ENCOUNTER — Telehealth: Payer: Self-pay | Admitting: Family Medicine

## 2016-02-05 DIAGNOSIS — M5416 Radiculopathy, lumbar region: Secondary | ICD-10-CM

## 2016-02-05 NOTE — Telephone Encounter (Signed)
Would like MRI results

## 2016-02-05 NOTE — Telephone Encounter (Signed)
Spoke with wife. She said he would like to go ahead and schedule the epidural.

## 2016-02-06 DIAGNOSIS — C61 Malignant neoplasm of prostate: Secondary | ICD-10-CM | POA: Diagnosis not present

## 2016-03-08 ENCOUNTER — Encounter: Payer: Self-pay | Admitting: Family Medicine

## 2016-03-08 ENCOUNTER — Ambulatory Visit (INDEPENDENT_AMBULATORY_CARE_PROVIDER_SITE_OTHER): Payer: Medicare Other | Admitting: Family Medicine

## 2016-03-08 VITALS — BP 126/76 | HR 70 | Wt 206.0 lb

## 2016-03-08 DIAGNOSIS — M48062 Spinal stenosis, lumbar region with neurogenic claudication: Secondary | ICD-10-CM

## 2016-03-08 DIAGNOSIS — IMO0002 Reserved for concepts with insufficient information to code with codable children: Secondary | ICD-10-CM

## 2016-03-08 DIAGNOSIS — M4806 Spinal stenosis, lumbar region: Secondary | ICD-10-CM | POA: Diagnosis not present

## 2016-03-08 DIAGNOSIS — M129 Arthropathy, unspecified: Secondary | ICD-10-CM | POA: Diagnosis not present

## 2016-03-08 DIAGNOSIS — M48061 Spinal stenosis, lumbar region without neurogenic claudication: Secondary | ICD-10-CM

## 2016-03-08 NOTE — Progress Notes (Signed)
[name] [address] [place], [place] [address] Phone: [phone] Subjective:    I'm seeing this patient by the request  of:  Walker Kehr, MD   CC: Left leg pain follow up  [id number] Edward [name] is a 82 y.o. male coming in with complaint of left leg pain. Patient in 2013 did have a quadriceps tendon repair. Patient unfortunate since last time of seeing him in 2015 did have prostate cancer and has been going for treatment for radiation.  Patient saw me previously and was diagnosed with moderate osteophytic changes of the knee as well as a chronic meniscal tear. Patient did have a second steroid injection a2 months agostates he does not know if it made any significant improvement. Continues to wear the brace occasionally. Continues to want to monitor and not have any surgical intervention.  Patient is also having some back pain. Considered severe. Patient was sent for an MRI because he is not making any significant improvement. MRI was independently visualized by me and shows moderate to severe spinal stenosis at multiple levels. Patient was going to undergo epidural injection but patient canceled the procedure. Patient continues to have some dull aching pain as well as stiffness. Once to continue to see if he can do some exercises. Patient has not been doing any exercises at this time.     Past Medical History  Diagnosis Date  . Hyperlipidemia   . Hypertension   . Elevated glucose   . Venous insufficiency of leg   . Drug-induced low platelet count     from NSAIDS  . Prostate cancer ([place])   . Complex renal cyst RIGHT--  MONITORED BY DR Risa Grill  . Chronic renal insufficiency     DR WEBB  . Nocturia   . Right knee injury     In past  . Crush injury, back   . Back pain     Difficulty walking- left leg pain  . Wears glasses   . Hearing loss   . History of swelling of feet   . Wears dentures     partial  . S/P radiation therapy  06/25/2015 through 08/19/2015    Prostate 7800 cGy in 40 sessions, seminal vesicles 5600 cGy in 40 sessions    Past Surgical History  Procedure Laterality Date  . Cryoablation  12/01/2012    Procedure: CRYO ABLATION PROSTATE;  Surgeon: Hanley Ben, MD;  Location: [facility];  Service: Urology;  Laterality: N/A;  . Quadriceps tendon repair Left 10/31/2013    Procedure: Left Quadriceps Tendon Repair;  Surgeon: Marybelle Killings, MD;  Location: [facility];  Service: Orthopedics;  Laterality: Left;  Left Quadriceps Tendon Repair  . Repair knee ligament Left 2014  . Colonoscopy  2/77/11   Social History  Substance Use Topics  . Smoking status: Never Smoker   . Smokeless tobacco: Never Used  . Alcohol Use: No   Allergies  Allergen Reactions  . Naproxen Sodium     REACTION: low plt     Past medical history, social, surgical and family history all reviewed in electronic medical record.   Review of Systems: No headache, visual changes, nausea, vomiting, diarrhea, constipation, dizziness, abdominal pain, skin rash, fevers, chills, night sweats, weight loss, swollen lymph nodes, body aches, joint swelling, muscle aches, chest pain, shortness of breath, mood changes.   Objective Blood pressure 126/76, pulse 70, weight 206 lb (93.441 kg), SpO2 97 %.  General: No apparent distress alert  and oriented x3 mood and affect normal, dressed appropriately.  HEENT: Pupils equal, extraocular movements intact  Respiratory: Patient's speak in full sentences and does not appear short of breath  Cardiovascular: No lower extremity edema, non tender, no erythema  Skin: Warm dry intact with no signs of infection or rash on extremities or on axial skeleton.  Abdomen: Soft nontender  Neuro: Cranial nerves II through XII are intact, neurovascularly intact in all extremities. Very mild peripheral neuropathy in all limbs. with 2+ DTRs and 2+ pulses.  Lymph: No lymphadenopathy of  posterior or anterior cervical chain or axillae bilaterally.  Gait patient does have some laxity of the knee with varus deformity an antalgic gait stiff lumbar spine.  MSK:  Non tender with full range of motion and good stability and symmetric strength and tone of shoulders, elbows, wrist, hip, and ankles bilaterally.   Back exam shows the patient does have severe loss of motion of the lumbar spine. Has forward flexion of approximately 20 in extension of 5. Minimal rotation bilaterally but good side bending. Increasing stiffness in the paraspinal musculature of the lumbar spine bilaterally Negative straight leg test bilaterally  the patient does have more tightness of the left hamstring compared to the right side. Strength still 3 out of 5 compared to the contralateral side. Deep tendon reflexes are intact   Knee: Left Varus deformity of the knee noted Nontender on exam Continued mild laxity of the MCL. Negative Mcmurray's, Apley's, and Thessalonian tests. Non painful patellar compression. Patellar glide with mild crepitus. Patellar and quadriceps tendons unremarkable. Hamstring and quadriceps strength is normal.  Contralateral knee has mild varus deformity      Impression and Recommendations:     This case required medical decision making of moderate complexity.

## 2016-03-08 NOTE — Assessment & Plan Note (Signed)
Sent to formal physical therapy to learn home exercises in greater detail. We discussed icing regimen and home exercises. We discussed the need for the epidural with patient having weakness and numbness possibly being irreversible if he waits too long. Patient states that he would still like to see how he responds to him increasing his activity. Walking protocol given. Follow-up in 3-4 weeks.

## 2016-03-08 NOTE — Patient Instructions (Addendum)
Good to see you  Ice is your friend We will get you exercises for your back at physical therapy  Stay active First week walk to the corner and back  Second week walk to the second corner and back  3rd week walk around the block  See me again in 4 weeks and if knee is still in pain we can try another injections Still believe it can be from your back and possible epidural could be helpful

## 2016-03-08 NOTE — Assessment & Plan Note (Signed)
Patient has not responded as well to the previous 2 injections. We did discuss viscous supplementation which patient declined. We discussed the encouragement to wear the brace. Patient will also avoid any surgical intervention. Patient's tightness and weakness of the lower extremity at think secondary to the spinal stenosis is what is really contributing to most of his discomfort. Patient though does not limit do an epidural at this time. Patient will follow-up again in 4 weeks. If worsening symptoms we'll consider repeating the steroid injection again.

## 2016-03-10 ENCOUNTER — Ambulatory Visit: Payer: Medicare Other | Admitting: Physical Therapy

## 2016-03-11 ENCOUNTER — Ambulatory Visit: Payer: Medicare Other | Attending: Physician Assistant

## 2016-03-11 DIAGNOSIS — M545 Low back pain, unspecified: Secondary | ICD-10-CM

## 2016-03-11 DIAGNOSIS — R5381 Other malaise: Secondary | ICD-10-CM

## 2016-03-11 DIAGNOSIS — Z9181 History of falling: Secondary | ICD-10-CM | POA: Diagnosis not present

## 2016-03-11 DIAGNOSIS — R262 Difficulty in walking, not elsewhere classified: Secondary | ICD-10-CM | POA: Diagnosis not present

## 2016-03-11 DIAGNOSIS — R531 Weakness: Secondary | ICD-10-CM | POA: Diagnosis not present

## 2016-03-11 DIAGNOSIS — M4806 Spinal stenosis, lumbar region: Secondary | ICD-10-CM | POA: Diagnosis not present

## 2016-03-11 DIAGNOSIS — M48061 Spinal stenosis, lumbar region without neurogenic claudication: Secondary | ICD-10-CM

## 2016-03-11 NOTE — Therapy (Signed)
[place], [place], [address] Phone: [phone]   Fax:  [phone]  Physical Therapy Evaluation  Patient Details  Name: MAMADY ARTZER MRN: [id number] Date of Birth: January 30, 1933 Referring Provider: Dr Charlann Boxer   Encounter Date: 03/11/2016      PT End of Session - 03/11/16 1822    Visit Number 1   Number of Visits 24   Date for PT Re-Evaluation 05/06/16   PT Start Time 1330  extended eval time required for back and balance, only 2 units billed.    PT Stop Time 1415   PT Time Calculation (min) 45 min      Past Medical History  Diagnosis Date  . Hyperlipidemia   . Hypertension   . Elevated glucose   . Venous insufficiency of leg   . Drug-induced low platelet count     from NSAIDS  . Prostate cancer ([place])   . Complex renal cyst RIGHT--  MONITORED BY DR Risa Grill  . Chronic renal insufficiency     DR WEBB  . Nocturia   . Right knee injury     In past  . Crush injury, back   . Back pain     Difficulty walking- left leg pain  . Wears glasses   . Hearing loss   . History of swelling of feet   . Wears dentures     partial  . S/P radiation therapy 06/25/2015 through 08/19/2015    Prostate 7800 cGy in 40 sessions, seminal vesicles 5600 cGy in 40 sessions     Past Surgical History  Procedure Laterality Date  . Cryoablation  12/01/2012    Procedure: CRYO ABLATION PROSTATE;  Surgeon: Hanley Ben, MD;  Location: [facility];  Service: Urology;  Laterality: N/A;  . Quadriceps tendon repair Left 10/31/2013    Procedure: Left Quadriceps Tendon Repair;  Surgeon: Marybelle Killings, MD;  Location: [facility];  Service: Orthopedics;  Laterality: Left;  Left Quadriceps Tendon Repair  . Repair knee ligament Left 2014  . Colonoscopy  2/77/11    There were no vitals filed for this visit.  Visit Diagnosis:  Risk for falls - Plan: PT plan of care cert/re-cert  Difficulty  in walking - Plan: PT plan of care cert/re-cert  Weakness - Plan: PT plan of care cert/re-cert  Debility - Plan: PT plan of care cert/re-cert  Midline low back pain without sciatica - Plan: PT plan of care cert/re-cert  Spinal stenosis of lumbar region - Plan: PT plan of care cert/re-cert      Subjective Assessment - 03/11/16 1339    Subjective Pt was referred from MD due to "spinal stenosis". Pt has some pain in AM with back that resolves by about 10 am. The main concerns are pt's LE weakness, which MD attributes to his spinal stenosis, and his unsteady gait. Pt presents without cane today, stating "my cane is in the care- I don't use it a lot, but I probabaly should." Pt's wife confirms, "The problem is his unsteady gait."    Pertinent History HTN, DM, Venous insufficiency, Quad tendon, CRF, hyperlipidemia, LBP, stenosis with neurogenic claudication. arthrittis,    Limitations Standing   How long can you stand comfortably? 15 mins    How long can you walk comfortably? 30 mins with cart to hold onto    Patient Stated Goals walk better, less back pain.    Currently in Pain? Yes   Pain Score --  4/10  worst in AM    Pain Location Back   Pain Orientation Mid;Lower   Pain Descriptors / Indicators Aching   Pain Type Chronic pain   Pain Onset More than a month ago   Pain Frequency Intermittent            [facility] PT Assessment - 03/11/16 0001    Assessment   Medical Diagnosis LBP and unsteadiness   Referring Provider Dr Charlann Boxer    Onset Date/Surgical Date 04/12/15   Hand Dominance Right   Next MD Visit 04/05/16   Prior Therapy epidural injection    Precautions   Precautions None   Required Braces or Orthoses Other Brace/Splint   Other Brace/Splint wears back neoprene brace prn    Restrictions   Weight Bearing Restrictions No   Balance Screen   Has the patient fallen in the past 6 months No   [place] residence   Prior Function   Level  of Independence Independent   Cognition   Overall Cognitive Status Within Functional Limits for tasks assessed   Observation/Other Assessments   Focus on Therapeutic Outcomes (FOTO)  Intake: 65% limited, Predicted: 47% limited.    Observation/Other Assessments-Edema    Edema Circumferential  10 cm inf tibial tuberosity: R 42cm, L 40 cm   Sensation   Additional Comments R LE swelling compared to L is typical for pt, per pt.   Posture/Postural Control   Posture/Postural Control Postural limitations   Postural Limitations Rounded Shoulders;Forward head;Decreased lumbar lordosis;Increased thoracic kyphosis;Posterior pelvic tilt;Flexed trunk;Weight shift right   ROM / Strength   AROM / PROM / Strength AROM;Strength   AROM   Overall AROM Comments In standing: Knee flexion to R: 25, L: 30, hip flexion to: R: 20, L: 32 degrees  at rest in standing,    AROM Assessment Site Lumbar   Lumbar Flexion 10-40   Lumbar Extension -10-0  lacking 10 degrees from neutral   Lumbar - Right Side Bend 8   Lumbar - Left Side Bend 6   Strength   Strength Assessment Site Hip;Knee;Ankle   Right/Left Hip Right;Left   Right Hip Flexion 3/5   Right Hip Extension 3-/5   Right Hip ABduction 2/5   Left Hip Flexion 3/5   Left Hip Extension 3-/5   Left Hip ABduction 2/5   Right/Left Knee Right;Left   Right Knee Flexion 3+/5   Right Knee Extension 4-/5  ext lag with SLR   Left Knee Flexion 3/5   Left Knee Extension 3+/5  ext lag with SLR   Right/Left Ankle Right;Left   Right Ankle Dorsiflexion 3+/5   Right Ankle Plantar Flexion 2+/5   Left Ankle Dorsiflexion 3/5   Left Ankle Plantar Flexion 2/5   Flexibility   Soft Tissue Assessment /Muscle Length yes   Hamstrings 90/90 supine: L -50, R: -40   Ambulation/Gait   Gait Comments Wide BOS, arm spread wide, deviation from midline, forward , flexed posture, reaching for wall/ furniture without A.D.    Balance   Balance Assessed Yes   Standardized Balance  Assessment   Standardized Balance Assessment Berg Balance Test;Timed Up and Go Test;Vestibular Evaluation   Berg Balance Test   Sit to Stand Able to stand using hands after several tries   Standing Unsupported Able to stand 2 minutes with supervision   Sitting with Back Unsupported but Feet Supported on Floor or Stool Able to sit safely and securely 2 minutes   Stand to Sit  Controls descent by using hands   Transfers Able to transfer safely, definite need of hands   Standing Unsupported with Eyes Closed Unable to keep eyes closed 3 seconds but stays steady   Standing Ubsupported with Feet Together Able to place feet together independently and stand for 1 minute with supervision   From Standing, Reach Forward with Outstretched Arm Reaches forward but needs supervision   From Standing Position, Pick up Object from Floor Unable to try/needs assist to keep balance   From Standing Position, Turn to Look Behind Over each Shoulder Needs supervision when turning   Turn 360 Degrees Needs close supervision or verbal cueing   Standing Unsupported, Alternately Place Feet on Step/Stool Needs assistance to keep from falling or unable to try   Standing Unsupported, One Foot in [employer] balance while stepping or standing   Standing on One Leg Unable to try or needs assist to prevent fall   Total Score 22   Timed Up and Go Test   TUG --  Test at next visit   High Level Balance   High Level Balance Activites Head turns  head rotation to R and up casued LOB   High Level Balance Comments feet together, eyes closed LOB @ 3 secs. Feet together, Eyes open : 30 secs    Functional Gait  Assessment   Gait assessed  No                   [facility] Adult PT Treatment/Exercise - 03/11/16 0001    Self-Care   Self-Care Other Self-Care Comments  use SPC   Other Self-Care Comments  Fall risk   use SPC   Neuro Re-ed    Neuro Re-ed Details  Feet together eyes open, eyes closed, head rotation    Exercises    Exercises Lumbar;Knee/Hip;Ankle   Lumbar Exercises: Stretches   Passive Hamstring Stretch 1 rep;30 seconds   Passive Hamstring Stretch Limitations PT assisted   Knee/Hip Exercises: Seated   Long Arc Quad 5 reps   Knee/Hip Exercises: Supine   Straight Leg Raises 5 reps   Other Supine Knee/Hip Exercises hip ABD x 5 bilat                 PT Education - 03/11/16 1810    Education provided Yes   Education Details PT POC, use SPC, risk for falls,    Person(s) Educated Patient;Spouse   Methods Explanation;Demonstration   Comprehension Verbalized understanding          PT Short Term Goals - 03/11/16 1815    PT SHORT TERM GOAL #1   Title Pt will be I with initial HEP for continued strengthening and mobility by 04/11/16    Time 4   Period Weeks   Status New   PT SHORT TERM GOAL #2   Title Pt will be able to demo posture and body mechanics as it relates to lumbar spine by 04/11/16.    Time 4   Period Weeks   Status New   PT SHORT TERM GOAL #3   Title BIlat HS length will improve to - 35 degrees bilat in 90/90 supine by 04/11/16.   Time 4   Period Weeks   Status New           PT Long Term Goals - 03/11/16 1816    PT LONG TERM GOAL #1   Title FOTO score will improve from 65% limited to 47% limited to demo improved function and mobility by  05/05/16.     Time 8   Period Weeks   Status New   PT LONG TERM GOAL #2   Title BERG score will improve from 22/56 to 32/56 to improve mobility and decrease risk for falls by 05/05/16.    Time 8   Period Weeks   Status New   PT LONG TERM GOAL #3   Title Hip strength will improve to 4-/5 throughout to improve pts ability to perform sit to stand transfers by 05/05/16.    Time 8   Period Weeks   Status New   PT LONG TERM GOAL #4   Title TUG will improve to <15 secs with appropriate Assistive device to decrease risk for falls and improve community mobility by 05/05/16.    Time 8   Period Weeks   Status New                Plan - 03/24/2016 1802    Clinical Impression Statement Pt presents with LBP due to spinal stenosis, but more of a concern is his LE weakness (possibly due to stenosis and his inactivity), unsteadiness with gait, and risk for falls.  Pt presents with impairments including pain, impaired mobility/ROM, and impaired strength, which limit pt's functional abilities with sitting, standing, walking, bending, lifting. In addidtion, pt would benefit from gait and balance training as pt is a high risk for falls, based on his Berg and TUG. Pt will benefit from oupt PT for 3 times a week for 8 weeks for core strengthening, stretching, pt education in order to address these impairments and functional limitations and return pt to pain-free PLOF.    Pt will benefit from skilled therapeutic intervention in order to improve on the following deficits Abnormal gait;Decreased activity tolerance;Decreased balance;Decreased mobility;Decreased strength;Impaired flexibility;Improper body mechanics;Postural dysfunction;Impaired sensation;Pain;Increased muscle spasms;Decreased safety awareness;Decreased endurance;Decreased range of motion;Difficulty walking;Decreased knowledge of use of DME;Impaired perceived functional ability;Other (comment)  risk for falls    Rehab Potential Good   Clinical Impairments Affecting Rehab Potential age, chronicity of pain, debility/ low activity level   PT Frequency 3x / week   PT Duration 8 weeks   PT Treatment/Interventions ADLs/Self Care Home Management;Therapeutic exercise;Therapeutic activities;Stair training;Gait training;Traction;Moist Heat;Balance training;Manual techniques;Taping;Passive range of motion;Neuromuscular re-education;DME Instruction;Patient/family education   PT Next Visit Plan Assess TUG and bilat DF AROM measures. Recommend RW? Est HEP: LAQ, marches, core supine with LE marches, SLR, hip ABD.  H/S stretch. Balance and gait training.    PT Home Exercise Plan Use SPC for gait  for safety.    Consulted and Agree with Plan of Care Patient;Family member/caregiver   Family Member Consulted wife          G-Codes - 03/24/2016 1821    Functional Assessment Tool Used FOTO and clinical judgement    Functional Limitation Mobility: Walking and moving around   Mobility: Walking and Moving Around Current Status [phone]) At least 60 percent but less than 80 percent impaired, limited or restricted   Mobility: Walking and Moving Around Goal Status [phone]) At least 40 percent but less than 60 percent impaired, limited or restricted       Problem List Patient Active Problem List   Diagnosis Date Noted  . Spinal stenosis, lumbar region, with neurogenic claudication 03/08/2016  . Arthritis of left lower extremity 09/25/2015  . Gait disorder 09/24/2015  . Edema 04/03/2014  . Rupture quadriceps tendon 11/12/2013    Class: Diagnosis of  . Prostate cancer ([place]) 09/29/2012  . Colon polyp 09/27/2011  .  Well adult exam 09/27/2011  . LOW BACK PAIN 07/16/2008  . ACQUIRED CYST OF KIDNEY 01/18/2008  . Diet-controlled type 2 diabetes mellitus ([place]) 01/18/2008  . VENOUS INSUFFICIENCY 10/20/2007  . CRF (chronic renal failure) 10/20/2007  . HYPERLIPIDEMIA 09/12/2007  . Essential hypertension 09/12/2007  . HEMATURIA, MICROSCOPIC 09/12/2007    Dollene Cleveland, PT 03/11/2016, 6:28 PM  [facility] [address] [place], [place], [address] Phone: [phone]   Fax:  [phone]  Name: SADAT YARTER MRN: [id number] Date of Birth: [birth year]

## 2016-03-15 ENCOUNTER — Ambulatory Visit: Payer: Medicare Other | Admitting: Physical Therapy

## 2016-03-15 DIAGNOSIS — R531 Weakness: Secondary | ICD-10-CM | POA: Diagnosis not present

## 2016-03-15 DIAGNOSIS — M545 Low back pain, unspecified: Secondary | ICD-10-CM

## 2016-03-15 DIAGNOSIS — R5381 Other malaise: Secondary | ICD-10-CM | POA: Diagnosis not present

## 2016-03-15 DIAGNOSIS — M48061 Spinal stenosis, lumbar region without neurogenic claudication: Secondary | ICD-10-CM

## 2016-03-15 DIAGNOSIS — M4806 Spinal stenosis, lumbar region: Secondary | ICD-10-CM | POA: Diagnosis not present

## 2016-03-15 DIAGNOSIS — Z9181 History of falling: Secondary | ICD-10-CM | POA: Diagnosis not present

## 2016-03-15 DIAGNOSIS — R262 Difficulty in walking, not elsewhere classified: Secondary | ICD-10-CM

## 2016-03-15 NOTE — Therapy (Signed)
[place], [place], [address] Phone: [phone]   Fax:  [phone]  Physical Therapy Treatment  Patient Details  Name: Edward [name] MRN: [id number] Date of Birth: 02/02/1933 Referring Provider: Dr Charlann Boxer   Encounter Date: 03/15/2016      PT End of Session - 03/15/16 1153    Visit Number 2   Number of Visits 24   Date for PT Re-Evaluation 05/06/16   PT Start Time 1100   PT Stop Time 1145   PT Time Calculation (min) 45 min      Past Medical History  Diagnosis Date  . Hyperlipidemia   . Hypertension   . Elevated glucose   . Venous insufficiency of leg   . Drug-induced low platelet count     from NSAIDS  . Prostate cancer ([place])   . Complex renal cyst RIGHT--  MONITORED BY DR Risa Grill  . Chronic renal insufficiency     DR WEBB  . Nocturia   . Right knee injury     In past  . Crush injury, back   . Back pain     Difficulty walking- left leg pain  . Wears glasses   . Hearing loss   . History of swelling of feet   . Wears dentures     partial  . S/P radiation therapy 06/25/2015 through 08/19/2015    Prostate 7800 cGy in 40 sessions, seminal vesicles 5600 cGy in 40 sessions     Past Surgical History  Procedure Laterality Date  . Cryoablation  12/01/2012    Procedure: CRYO ABLATION PROSTATE;  Surgeon: Hanley Ben, MD;  Location: [facility];  Service: Urology;  Laterality: N/A;  . Quadriceps tendon repair Left 10/31/2013    Procedure: Left Quadriceps Tendon Repair;  Surgeon: Marybelle Killings, MD;  Location: [facility];  Service: Orthopedics;  Laterality: Left;  Left Quadriceps Tendon Repair  . Repair knee ligament Left 2014  . Colonoscopy  2/77/11    There were no vitals filed for this visit.  Visit Diagnosis:  Risk for falls  Difficulty in walking  Weakness  Debility  Midline low back pain without sciatica  Spinal stenosis of lumbar  region      Subjective Assessment - 03/15/16 1344    Subjective My legs feel more stiff since I started this. Especially my knees.   Currently in Pain? No/denies            [facility] PT Assessment - 03/15/16 0001    AROM   AROM Assessment Site Ankle   Right/Left Ankle Right;Left   Right Ankle Dorsiflexion -4  supine   Left Ankle Dorsiflexion -8  supine   Timed Up and Go Test   TUG Normal TUG   Normal TUG (seconds) 17  with SPC                     [facility] Adult PT Treatment/Exercise - 03/15/16 0001    Knee/Hip Exercises: Stretches   Active Hamstring Stretch 2 reps;30 seconds   Active Hamstring Stretch Limitations seated edge of mat   Knee/Hip Exercises: Standing   SLS with Vectors 3 way hip x 10 each bilateral   Knee/Hip Exercises: Seated   Long Arc Quad 2 sets;10 reps   Marching Limitations 10x2   Abduction/Adduction  2 sets;10 reps   Abd/Adduction Limitations clam with red band    Knee/Hip Exercises: Supine   Straight Leg Raises 10 reps  Ankle Exercises: Seated   Heel Raises 10 reps   Toe Raise 10 reps   Ankle Exercises: Standing   Other Standing Ankle Exercises standing heel and toe raises- minimal ROM                PT Education - 03/15/16 1153    Education provided Yes   Education Details HEP   Person(s) Educated Patient   Methods Explanation;Handout   Comprehension Verbalized understanding          PT Short Term Goals - 03/11/16 1815    PT SHORT TERM GOAL #1   Title Pt will be I with initial HEP for continued strengthening and mobility by 04/11/16    Time 4   Period Weeks   Status New   PT SHORT TERM GOAL #2   Title Pt will be able to demo posture and body mechanics as it relates to lumbar spine by 04/11/16.    Time 4   Period Weeks   Status New   PT SHORT TERM GOAL #3   Title BIlat HS length will improve to - 35 degrees bilat in 90/90 supine by 04/11/16.   Time 4   Period Weeks   Status New           PT Long Term  Goals - 03/11/16 1816    PT LONG TERM GOAL #1   Title FOTO score will improve from 65% limited to 47% limited to demo improved function and mobility by 05/05/16.     Time 8   Period Weeks   Status New   PT LONG TERM GOAL #2   Title BERG score will improve from 22/56 to 32/56 to improve mobility and decrease risk for falls by 05/05/16.    Time 8   Period Weeks   Status New   PT LONG TERM GOAL #3   Title Hip strength will improve to 4-/5 throughout to improve pts ability to perform sit to stand transfers by 05/05/16.    Time 8   Period Weeks   Status New   PT LONG TERM GOAL #4   Title TUG will improve to <15 secs with appropriate Assistive device to decrease risk for falls and improve community mobility by 05/05/16.    Time 8   Period Weeks   Status New               Plan - 03/15/16 1338    Clinical Impression Statement Pt presents with SPC and reports his legs seem to be more stiff since beginning PT. He tried some of the exercises from last visit. Instructed pt in seated, supine, standing hip,knee and ankle strength as well as hamstring stretch. Created comprehensive HEP and discussed it with his wife. Trial of standing heel and toe raises with pt demonstrating decreased strength. Able to perfrom full ROM with seated heel and toe raises. Standing 3 way hip with max cues for technique and posture throughout. Pt reports no increased pain post session.    PT Next Visit Plan . Recommend RW? Review  HEP; add core supine with LE  marches;  Balance and gait training.         Problem List Patient Active Problem List   Diagnosis Date Noted  . Spinal stenosis, lumbar region, with neurogenic claudication 03/08/2016  . Arthritis of left lower extremity 09/25/2015  . Gait disorder 09/24/2015  . Edema 04/03/2014  . Rupture quadriceps tendon 11/12/2013    Class: Diagnosis of  . Prostate cancer (  [place]) 09/29/2012  . Colon polyp 09/27/2011  . Well adult exam 09/27/2011  . LOW BACK PAIN  07/16/2008  . ACQUIRED CYST OF KIDNEY 01/18/2008  . Diet-controlled type 2 diabetes mellitus ([place]) 01/18/2008  . VENOUS INSUFFICIENCY 10/20/2007  . CRF (chronic renal failure) 10/20/2007  . HYPERLIPIDEMIA 09/12/2007  . Essential hypertension 09/12/2007  . HEMATURIA, MICROSCOPIC 09/12/2007    Dorene Ar, PTA 03/15/2016, 1:51 PM  [place], [place], [address] Phone: [phone]   Fax:  [phone]  Name: Edward [name] MRN: [id number] Date of Birth: June 03, 1933

## 2016-03-22 ENCOUNTER — Ambulatory Visit: Payer: Medicare Other | Attending: Internal Medicine | Admitting: Physical Therapy

## 2016-03-22 DIAGNOSIS — Z9181 History of falling: Secondary | ICD-10-CM | POA: Insufficient documentation

## 2016-03-22 DIAGNOSIS — M545 Low back pain: Secondary | ICD-10-CM | POA: Diagnosis not present

## 2016-03-22 DIAGNOSIS — R262 Difficulty in walking, not elsewhere classified: Secondary | ICD-10-CM

## 2016-03-22 DIAGNOSIS — M6281 Muscle weakness (generalized): Secondary | ICD-10-CM | POA: Diagnosis not present

## 2016-03-22 DIAGNOSIS — R2681 Unsteadiness on feet: Secondary | ICD-10-CM | POA: Insufficient documentation

## 2016-03-22 NOTE — Patient Instructions (Addendum)
Hip Flexor Stretch    Lying on back near edge of bed, bend one leg, foot flat. Hang other leg over edge, relaxed, thigh resting entirely on bed for __1__ minutes. Repeat __1__ times.  Each side. Do _2___ sessions per day. Bend knee to feel more stretch.   Knee to Chest (Flexion)    Pull knee toward chest. Feel stretch in lower back or buttock area. Breathing deeply, Hold __30__ seconds. Repeat with other knee. Repeat _3___ times each side. Do __2_ sessions per day.

## 2016-03-22 NOTE — Therapy (Signed)
[place], [place], [address] Phone: [phone]   Fax:  [phone]  Physical Therapy Treatment  Patient Details  Name: Edward [name] MRN: [id number] Date of Birth: July 24, 1933 Referring Provider: Dr Charlann Boxer   Encounter Date: 03/22/2016      PT End of Session - 03/22/16 0855    Visit Number 3   Number of Visits 24   PT Start Time 0626   PT Stop Time 0932   PT Time Calculation (min) 45 min      Past Medical History  Diagnosis Date  . Hyperlipidemia   . Hypertension   . Elevated glucose   . Venous insufficiency of leg   . Drug-induced low platelet count     from NSAIDS  . Prostate cancer ([place])   . Complex renal cyst RIGHT--  MONITORED BY DR Risa Grill  . Chronic renal insufficiency     DR WEBB  . Nocturia   . Right knee injury     In past  . Crush injury, back   . Back pain     Difficulty walking- left leg pain  . Wears glasses   . Hearing loss   . History of swelling of feet   . Wears dentures     partial  . S/P radiation therapy 06/25/2015 through 08/19/2015    Prostate 7800 cGy in 40 sessions, seminal vesicles 5600 cGy in 40 sessions     Past Surgical History  Procedure Laterality Date  . Cryoablation  12/01/2012    Procedure: CRYO ABLATION PROSTATE;  Surgeon: Hanley Ben, MD;  Location: [facility];  Service: Urology;  Laterality: N/A;  . Quadriceps tendon repair Left 10/31/2013    Procedure: Left Quadriceps Tendon Repair;  Surgeon: Marybelle Killings, MD;  Location: [facility];  Service: Orthopedics;  Laterality: Left;  Left Quadriceps Tendon Repair  . Repair knee ligament Left 2014  . Colonoscopy  2/77/11    There were no vitals filed for this visit.  Visit Diagnosis:  Risk for falls  Difficulty in walking, not elsewhere classified  Muscle weakness (generalized)      Subjective Assessment - 03/22/16 0855    Subjective My legs still feel  stiff and cramp when i do my exercises.    Currently in Pain? No/denies            [facility] PT Assessment - 03/22/16 0001    Flexibility   Hamstrings 90/90 supine: L -50, R: -40                     [facility] Adult PT Treatment/Exercise - 03/22/16 0001    Lumbar Exercises: Stretches   Single Knee to Chest Stretch 3 reps;30 seconds   Lumbar Exercises: Supine   Clam 20 reps   Clam Limitations green band x20   Bent Knee Raise 20 reps   Bent Knee Raise Limitations with ab set   Knee/Hip Exercises: Stretches   Active Hamstring Stretch 2 reps;30 seconds   Active Hamstring Stretch Limitations seated edge of mat   Passive Hamstring Stretch 2 reps;30 seconds   Hip Flexor Stretch 1 rep;60 seconds   Hip Flexor Stretch Limitations bilateral   Knee/Hip Exercises: Aerobic   Nustep L 4 UE/LE x 5 minutes.    Knee/Hip Exercises: Supine   Straight Leg Raises 10 reps                PT Education - 03/22/16 9485  Education provided Yes   Education Details hip flexor stretch of table, Ab set with supine marches, knee to chest stretch   Person(s) Educated Patient;Spouse   Methods Explanation;Handout   Comprehension Verbalized understanding          PT Short Term Goals - 03/22/16 0859    PT SHORT TERM GOAL #1   Title Pt will be I with initial HEP for continued strengthening and mobility by 04/11/16    Time 4   Period Weeks   Status Partially Met   PT SHORT TERM GOAL #2   Title Pt will be able to demo posture and body mechanics as it relates to lumbar spine by 04/11/16.    Time 4   Period Weeks   Status On-going   PT SHORT TERM GOAL #3   Title BIlat HS length will improve to - 35 degrees bilat in 90/90 supine by 04/11/16.   Time 4   Period Weeks   Status On-going           PT Long Term Goals - 03/22/16 1706    PT LONG TERM GOAL #1   Title FOTO score will improve from 65% limited to 47% limited to demo improved function and mobility by 05/05/16.     Time 8    Period Weeks   Status On-going   PT LONG TERM GOAL #2   Title BERG score will improve from 22/56 to 32/56 to improve mobility and decrease risk for falls by 05/05/16.    Time 8   Period Weeks   Status On-going   PT LONG TERM GOAL #3   Title Hip strength will improve to 4-/5 throughout to improve pts ability to perform sit to stand transfers by 05/05/16.    Time 8   Period Weeks   Status On-going   PT LONG TERM GOAL #4   Title TUG will improve to <15 secs with appropriate Assistive device to decrease risk for falls and improve community mobility by 05/05/16.    Time 8   Period Weeks   Status On-going               Plan - 03/22/16 0934    Clinical Impression Statement Pt reports continued soreness after HEP as well as knee stiffnes. Hamstring length measures about the same as eval. Added hip flexor stretch and knee to chest for knee and hip stiffness as well as supine marches with ab set for core strength. Will begin begin to focus more strength and balance.    PT Next Visit Plan balance and gait, review new HEP        Problem List Patient Active Problem List   Diagnosis Date Noted  . Spinal stenosis, lumbar region, with neurogenic claudication 03/08/2016  . Arthritis of left lower extremity 09/25/2015  . Gait disorder 09/24/2015  . Edema 04/03/2014  . Rupture quadriceps tendon 11/12/2013    Class: Diagnosis of  . Prostate cancer ([place]) 09/29/2012  . Colon polyp 09/27/2011  . Well adult exam 09/27/2011  . LOW BACK PAIN 07/16/2008  . ACQUIRED CYST OF KIDNEY 01/18/2008  . Diet-controlled type 2 diabetes mellitus ([place]) 01/18/2008  . VENOUS INSUFFICIENCY 10/20/2007  . CRF (chronic renal failure) 10/20/2007  . HYPERLIPIDEMIA 09/12/2007  . Essential hypertension 09/12/2007  . HEMATURIA, MICROSCOPIC 09/12/2007    Dorene Ar, PTA 03/22/2016, 5:13 PM  [place], [place], [address] Phone:  [phone]   Fax:  [phone]  Name: Edward [name] MRN: [id number] Date of Birth: Jul 15, 1933

## 2016-03-24 ENCOUNTER — Ambulatory Visit: Payer: Medicare Other | Admitting: Physical Therapy

## 2016-03-24 DIAGNOSIS — M6281 Muscle weakness (generalized): Secondary | ICD-10-CM

## 2016-03-24 DIAGNOSIS — R262 Difficulty in walking, not elsewhere classified: Secondary | ICD-10-CM | POA: Diagnosis not present

## 2016-03-24 DIAGNOSIS — R2681 Unsteadiness on feet: Secondary | ICD-10-CM

## 2016-03-24 DIAGNOSIS — M545 Low back pain: Secondary | ICD-10-CM | POA: Diagnosis not present

## 2016-03-24 DIAGNOSIS — Z9181 History of falling: Secondary | ICD-10-CM | POA: Diagnosis not present

## 2016-03-24 NOTE — Therapy (Signed)
[place], [place], [address] Phone: [phone]   Fax:  [phone]  Physical Therapy Treatment  Patient Details  Name: Edward [name] MRN: [id number] Date of Birth: 11-30-1933 Referring Provider: Dr Charlann Boxer   Encounter Date: 03/24/2016      PT End of Session - 03/24/16 0959    Visit Number 4   Number of Visits 24   Date for PT Re-Evaluation 05/06/16   PT Start Time 2863   PT Stop Time 0930   PT Time Calculation (min) 43 min      Past Medical History  Diagnosis Date  . Hyperlipidemia   . Hypertension   . Elevated glucose   . Venous insufficiency of leg   . Drug-induced low platelet count     from NSAIDS  . Prostate cancer ([place])   . Complex renal cyst RIGHT--  MONITORED BY DR Risa Grill  . Chronic renal insufficiency     DR WEBB  . Nocturia   . Right knee injury     In past  . Crush injury, back   . Back pain     Difficulty walking- left leg pain  . Wears glasses   . Hearing loss   . History of swelling of feet   . Wears dentures     partial  . S/P radiation therapy 06/25/2015 through 08/19/2015    Prostate 7800 cGy in 40 sessions, seminal vesicles 5600 cGy in 40 sessions     Past Surgical History  Procedure Laterality Date  . Cryoablation  12/01/2012    Procedure: CRYO ABLATION PROSTATE;  Surgeon: Hanley Ben, MD;  Location: [facility];  Service: Urology;  Laterality: N/A;  . Quadriceps tendon repair Left 10/31/2013    Procedure: Left Quadriceps Tendon Repair;  Surgeon: Marybelle Killings, MD;  Location: [facility];  Service: Orthopedics;  Laterality: Left;  Left Quadriceps Tendon Repair  . Repair knee ligament Left 2014  . Colonoscopy  2/77/11    There were no vitals filed for this visit.  Visit Diagnosis:  Muscle weakness (generalized)  Unsteadiness on feet      Subjective Assessment - 03/24/16 0850    Subjective he stated that he was a  little sore and stiff. stated that his wife gave him a workout yesterday. stated he has cramps when he stretches in bed(calf).                         [facility] Adult PT Treatment/Exercise - 03/24/16 0001    High Level Balance   High Level Balance Activities Side stepping;Backward walking;Direction changes;Turns;Head turns;Tandem walking  CGA   High Level Balance Comments added foam with head turns., trunk rotations with narrow base, SLS trial 3 sec best, Tandem trials 5 sec best   Knee/Hip Exercises: Seated   Long Arc Quad 2 sets;10 reps   Long Arc Quad Weight 3 lbs.                  PT Short Term Goals - 03/22/16 0859    PT SHORT TERM GOAL #1   Title Pt will be I with initial HEP for continued strengthening and mobility by 04/11/16    Time 4   Period Weeks   Status Partially Met   PT SHORT TERM GOAL #2   Title Pt will be able to demo posture and body mechanics as it relates to lumbar spine by 04/11/16.  Time 4   Period Weeks   Status On-going   PT SHORT TERM GOAL #3   Title BIlat HS length will improve to - 35 degrees bilat in 90/90 supine by 04/11/16.   Time 4   Period Weeks   Status On-going           PT Long Term Goals - 03/22/16 1706    PT LONG TERM GOAL #1   Title FOTO score will improve from 65% limited to 47% limited to demo improved function and mobility by 05/05/16.     Time 8   Period Weeks   Status On-going   PT LONG TERM GOAL #2   Title BERG score will improve from 22/56 to 32/56 to improve mobility and decrease risk for falls by 05/05/16.    Time 8   Period Weeks   Status On-going   PT LONG TERM GOAL #3   Title Hip strength will improve to 4-/5 throughout to improve pts ability to perform sit to stand transfers by 05/05/16.    Time 8   Period Weeks   Status On-going   PT LONG TERM GOAL #4   Title TUG will improve to <15 secs with appropriate Assistive device to decrease risk for falls and improve community mobility by 05/05/16.     Time 8   Period Weeks   Status On-going               Plan - 03/24/16 0954    Clinical Impression Statement Focused todays treatment on balance activities. Challenged pt with narrow base, tandem, SLS static and dynamic balance activities in parallel bars.  Pt demonstrates multiple LOB with trunk rotations on foam pad.  Pt able to correct with parallel bars. Seated quad strengthening without c/o increased pain. Pt reports difficulty with weight bearing on left LE due to prior knee surgery.Pt reports not thinking that PT is going to help him. Encouragement provided to continue PT and that it may take a few weeks to show progress.    Pt will benefit from skilled therapeutic intervention in order to improve on the following deficits --  muscle weakness, unsteadiness on feet.    PT Next Visit Plan balance and gait, review new HEP        Problem List Patient Active Problem List   Diagnosis Date Noted  . Spinal stenosis, lumbar region, with neurogenic claudication 03/08/2016  . Arthritis of left lower extremity 09/25/2015  . Gait disorder 09/24/2015  . Edema 04/03/2014  . Rupture quadriceps tendon 11/12/2013    Class: Diagnosis of  . Prostate cancer ([place]) 09/29/2012  . Colon polyp 09/27/2011  . Well adult exam 09/27/2011  . LOW BACK PAIN 07/16/2008  . ACQUIRED CYST OF KIDNEY 01/18/2008  . Diet-controlled type 2 diabetes mellitus ([place]) 01/18/2008  . VENOUS INSUFFICIENCY 10/20/2007  . CRF (chronic renal failure) 10/20/2007  . HYPERLIPIDEMIA 09/12/2007  . Essential hypertension 09/12/2007  . HEMATURIA, MICROSCOPIC 09/12/2007    Dorene Ar, PTA 03/24/2016, 10:15 AM  [place], [place], [address] Phone: [phone]   Fax:  [phone]  Name: RUBEN MAHLER MRN: [id number] Date of Birth: 10-11-1933

## 2016-03-26 ENCOUNTER — Ambulatory Visit: Payer: Medicare Other | Admitting: Physical Therapy

## 2016-03-26 DIAGNOSIS — Z9181 History of falling: Secondary | ICD-10-CM | POA: Diagnosis not present

## 2016-03-26 DIAGNOSIS — M6281 Muscle weakness (generalized): Secondary | ICD-10-CM | POA: Diagnosis not present

## 2016-03-26 DIAGNOSIS — R2681 Unsteadiness on feet: Secondary | ICD-10-CM

## 2016-03-26 DIAGNOSIS — M545 Low back pain: Secondary | ICD-10-CM | POA: Diagnosis not present

## 2016-03-26 DIAGNOSIS — R262 Difficulty in walking, not elsewhere classified: Secondary | ICD-10-CM | POA: Diagnosis not present

## 2016-03-26 NOTE — Therapy (Signed)
[place], [place], [address] Phone: [phone]   Fax:  [phone]  Physical Therapy Treatment  Patient Details  Name: Edward [name] MRN: [id number] Date of Birth: 1933/01/15 Referring Provider: Dr Charlann Boxer   Encounter Date: 03/26/2016      PT End of Session - 03/26/16 0801    Visit Number 5   Number of Visits 24   Date for PT Re-Evaluation 05/06/16   PT Start Time 5809   PT Stop Time 0838   PT Time Calculation (min) 41 min      Past Medical History  Diagnosis Date  . Hyperlipidemia   . Hypertension   . Elevated glucose   . Venous insufficiency of leg   . Drug-induced low platelet count     from NSAIDS  . Prostate cancer ([place])   . Complex renal cyst RIGHT--  MONITORED BY DR Risa Grill  . Chronic renal insufficiency     DR WEBB  . Nocturia   . Right knee injury     In past  . Crush injury, back   . Back pain     Difficulty walking- left leg pain  . Wears glasses   . Hearing loss   . History of swelling of feet   . Wears dentures     partial  . S/P radiation therapy 06/25/2015 through 08/19/2015    Prostate 7800 cGy in 40 sessions, seminal vesicles 5600 cGy in 40 sessions     Past Surgical History  Procedure Laterality Date  . Cryoablation  12/01/2012    Procedure: CRYO ABLATION PROSTATE;  Surgeon: Hanley Ben, MD;  Location: [facility];  Service: Urology;  Laterality: N/A;  . Quadriceps tendon repair Left 10/31/2013    Procedure: Left Quadriceps Tendon Repair;  Surgeon: Marybelle Killings, MD;  Location: [facility];  Service: Orthopedics;  Laterality: Left;  Left Quadriceps Tendon Repair  . Repair knee ligament Left 2014  . Colonoscopy  2/77/11    There were no vitals filed for this visit.      Subjective Assessment - 03/26/16 0802    Subjective a little sore yesterday, did a lot of exercises and shopping with my wife   Currently in Pain?  No/denies                         [facility] Adult PT Treatment/Exercise - 03/26/16 0001    High Level Balance   High Level Balance Activities Side stepping;Marching forwards;Tandem walking   Knee/Hip Exercises: Standing   Other Standing Knee Exercises tap forward and back in parallel bars, step ups on 4 inch step with min UE support 10x2 each, standing TKE with green band 10x2 left, rocking forward and back in staggered stand to engage left quad 10x2-added to HEP,    Knee/Hip Exercises: Seated   Long Arc Quad 2 sets;10 reps   Long Arc Quad Weight 3 lbs.                  PT Short Term Goals - 03/22/16 0859    PT SHORT TERM GOAL #1   Title Pt will be I with initial HEP for continued strengthening and mobility by 04/11/16    Time 4   Period Weeks   Status Partially Met   PT SHORT TERM GOAL #2   Title Pt will be able to demo posture and body mechanics as it relates to lumbar spine by 04/11/16.  Time 4   Period Weeks   Status On-going   PT SHORT TERM GOAL #3   Title BIlat HS length will improve to - 35 degrees bilat in 90/90 supine by 04/11/16.   Time 4   Period Weeks   Status On-going           PT Long Term Goals - 03/22/16 1706    PT LONG TERM GOAL #1   Title FOTO score will improve from 65% limited to 47% limited to demo improved function and mobility by 05/05/16.     Time 8   Period Weeks   Status On-going   PT LONG TERM GOAL #2   Title BERG score will improve from 22/56 to 32/56 to improve mobility and decrease risk for falls by 05/05/16.    Time 8   Period Weeks   Status On-going   PT LONG TERM GOAL #3   Title Hip strength will improve to 4-/5 throughout to improve pts ability to perform sit to stand transfers by 05/05/16.    Time 8   Period Weeks   Status On-going   PT LONG TERM GOAL #4   Title TUG will improve to <15 secs with appropriate Assistive device to decrease risk for falls and improve community mobility by 05/05/16.    Time 8    Period Weeks   Status On-going               Plan - 03/26/16 0810    Clinical Impression Statement Continued to focus on balance activities. Had him perform tandem walking(forwards and backwards), side-stepping, had him step forward and backwards from a narrow base. Had him step up onto 4 inch step. Performed LAQ's from seated with 3 lb. weight. x 10 reps. 2 sets. Pt. also performed terminal knee extension from standing with green band . Pt. only used parallel bars on occasion, when balance was lost. No increased pain during session.Marland Kitchen Required VC's to reach terminal knee extension. Educated him on his new HEP.   PT Next Visit Plan Sit-to-stand, discuss new HEP.      Patient will benefit from skilled therapeutic intervention in order to improve the following deficits and impairments:  Abnormal gait, Decreased activity tolerance, Decreased balance, Decreased strength, Difficulty walking, Impaired flexibility, Impaired perceived functional ability, Decreased mobility, Improper body mechanics, Postural dysfunction, Impaired sensation, Decreased range of motion, Increased muscle spasms, Decreased knowledge of use of DME, Decreased safety awareness  Visit Diagnosis: Muscle weakness (generalized)  Unsteadiness on feet  During this treatment session, the therapist was present, participating in and directing the treatment.   Problem List Patient Active Problem List   Diagnosis Date Noted  . Spinal stenosis, lumbar region, with neurogenic claudication 03/08/2016  . Arthritis of left lower extremity 09/25/2015  . Gait disorder 09/24/2015  . Edema 04/03/2014  . Rupture quadriceps tendon 11/12/2013    Class: Diagnosis of  . Prostate cancer ([place]) 09/29/2012  . Colon polyp 09/27/2011  . Well adult exam 09/27/2011  . LOW BACK PAIN 07/16/2008  . ACQUIRED CYST OF KIDNEY 01/18/2008  . Diet-controlled type 2 diabetes mellitus ([place]) 01/18/2008  . VENOUS INSUFFICIENCY 10/20/2007  . CRF (chronic  renal failure) 10/20/2007  . HYPERLIPIDEMIA 09/12/2007  . Essential hypertension 09/12/2007  . HEMATURIA, MICROSCOPIC 09/12/2007    Reva Bores, SPTA 03/26/2016, 10:51 AM   Hessie Diener, PTA 03/26/2016 12:12 PM Phone: [phone] Fax: [place], [place], [address] Phone: [phone]  Fax:  [phone]  Name: Edward [name] MRN: [id number] Date of Birth: 1933-07-13

## 2016-03-29 ENCOUNTER — Ambulatory Visit: Payer: Medicare Other | Admitting: Physical Therapy

## 2016-03-29 DIAGNOSIS — Z9181 History of falling: Secondary | ICD-10-CM | POA: Diagnosis not present

## 2016-03-29 DIAGNOSIS — R262 Difficulty in walking, not elsewhere classified: Secondary | ICD-10-CM | POA: Diagnosis not present

## 2016-03-29 DIAGNOSIS — M6281 Muscle weakness (generalized): Secondary | ICD-10-CM | POA: Diagnosis not present

## 2016-03-29 DIAGNOSIS — R2681 Unsteadiness on feet: Secondary | ICD-10-CM

## 2016-03-29 DIAGNOSIS — M545 Low back pain: Secondary | ICD-10-CM | POA: Diagnosis not present

## 2016-03-29 NOTE — Therapy (Signed)
[place], [place], [address] Phone: [phone]   Fax:  [phone]  Physical Therapy Treatment  Patient Details  Name: RENNE [name] MRN: [id number] Date of Birth: 12-Mar-1933 Referring Provider: Dr Charlann [name]   Encounter Date: 03/29/2016      PT End of Session - 03/29/16 1310    Visit Number 6   Number of Visits 24   Date for PT Re-Evaluation 05/06/16   PT Start Time 0845   PT Stop Time 0930   PT Time Calculation (min) 45 min      Past Medical History  Diagnosis Date  . Hyperlipidemia   . Hypertension   . Elevated glucose   . Venous insufficiency of leg   . Drug-induced low platelet count     from NSAIDS  . Prostate cancer ([place])   . Complex renal cyst RIGHT--  MONITORED BY Edward [name]  . Chronic renal insufficiency     Edward [name]  . Nocturia   . Right knee injury     In past  . Crush injury, back   . Back pain     Difficulty walking- left leg pain  . Wears glasses   . Hearing loss   . History of swelling of feet   . Wears dentures     partial  . S/P radiation therapy 06/25/2015 through 08/19/2015    Prostate 7800 cGy in 40 sessions, seminal vesicles 5600 cGy in 40 sessions     Past Surgical History  Procedure Laterality Date  . Cryoablation  12/01/2012    Procedure: CRYO ABLATION PROSTATE;  Surgeon: Edward Ben, MD;  Location: [name];  Service: Urology;  Laterality: N/A;  . Quadriceps tendon repair Left 10/31/2013    Procedure: Left Quadriceps Tendon Repair;  Surgeon: Edward Killings, MD;  Location: [facility];  Service: Orthopedics;  Laterality: Left;  Left Quadriceps Tendon Repair  . Repair knee ligament Left 2014  . Colonoscopy  2/77/11    There were no vitals filed for this visit.      Subjective Assessment - 03/29/16 0849    Subjective felt a little more sore today. said his wife put him through a tough workout.   Currently in Pain?  No/denies            Edward [name] PT Assessment - 03/29/16 0001    Strength   Right Hip Flexion 4/5   Right Hip ABduction 3/5   Left Hip Flexion 4-/5   Left Hip ABduction 3/5   Right Knee Flexion 4-/5   Right Knee Extension 4/5  in available ROM   Left Knee Flexion 4/5   Left Knee Extension 4+/5  in available ROM   Flexibility   Hamstrings Right -32, left -32 @ 90/90   Standardized Balance Assessment   Standardized Balance Assessment Timed Up and Go Test   Timed Up and Go Test   Normal TUG (seconds) 23  with SPC, 20 w/o AD                     [facility] Adult PT Treatment/Exercise - 03/29/16 0001    High Level Balance   High Level Balance Activities Tandem walking;Head turns;Negotiating over obstacles  head turns on airex pad, stepped up on box and over it.   High Level Balance Comments --  weight-shifting to TKE                  PT Short  Term Goals - 03/29/16 0854    PT SHORT TERM GOAL #1   Title Pt will be I with initial HEP for continued strengthening and mobility by 04/11/16    Time 4   Period Weeks   Status Achieved   PT SHORT TERM GOAL #2   Title Pt will be able to demo posture and body mechanics as it relates to lumbar spine by 04/11/16.    Time 4   Period Weeks   Status On-going   PT SHORT TERM GOAL #3   Title BIlat HS length will improve to - 35 degrees bilat in 90/90 supine by 04/11/16.   Time 4   Period Weeks   Status Achieved           PT Long Term Goals - 03/22/16 1706    PT LONG TERM GOAL #1   Title FOTO score will improve from 65% limited to 47% limited to demo improved function and mobility by 05/05/16.     Time 8   Period Weeks   Status On-going   PT LONG TERM GOAL #2   Title BERG score will improve from 22/56 to 32/56 to improve mobility and decrease risk for falls by 05/05/16.    Time 8   Period Weeks   Status On-going   PT LONG TERM GOAL #3   Title Hip strength will improve to 4-/5 throughout to improve pts ability to  perform sit to stand transfers by 05/05/16.    Time 8   Period Weeks   Status On-going   PT LONG TERM GOAL #4   Title TUG will improve to <15 secs with appropriate Assistive device to decrease risk for falls and improve community mobility by 05/05/16.    Time 8   Period Weeks   Status On-going               Plan - 03/29/16 1252    Clinical Impression Statement Continued to focus on balance activities. Started with tandem walking(forwards and backwards). Had him step up onto 4 inch step and then step down on opposite side of step and turn around and repeat. Had him walk to airex pad, step up and turn his head to the left, then to the right. Needed the parallel bars to maintain balance while turning on airex pad.. No increase pain during session. Needed occasional VC's to maintain upright posture. Improvement in manual muscle tests taken  but still needs to improve his hip strength. Reached STG #3.   PT Next Visit Plan Allow him to hold onto parallel bars during head turns on airex to maintain proper balance. Do MMT for other hip motions. Sit-to-stand, continue to get feedback on proper body mechanics, step-up to 4 inch step(turning may have been too advanced at this point).      Patient will benefit from skilled therapeutic intervention in order to improve the following deficits and impairments:     Visit Diagnosis: Muscle weakness (generalized)  Unsteadiness on feet  Difficulty in walking, not elsewhere classified    During this treatment session, the therapist was present, participating in and directing the treatment.  Problem List Patient Active Problem List   Diagnosis Date Noted  . Spinal stenosis, lumbar region, with neurogenic claudication 03/08/2016  . Arthritis of left lower extremity 09/25/2015  . Gait disorder 09/24/2015  . Edema 04/03/2014  . Rupture quadriceps tendon 11/12/2013    Class: Diagnosis of  . Prostate cancer ([place]) 09/29/2012  . Colon polyp  09/27/2011  .  Well adult exam 09/27/2011  . LOW BACK PAIN 07/16/2008  . ACQUIRED CYST OF KIDNEY 01/18/2008  . Diet-controlled type 2 diabetes mellitus ([place]) 01/18/2008  . VENOUS INSUFFICIENCY 10/20/2007  . CRF (chronic renal failure) 10/20/2007  . HYPERLIPIDEMIA 09/12/2007  . Essential hypertension 09/12/2007  . HEMATURIA, MICROSCOPIC 09/12/2007    Edward [name], Anabel Bene, [place] 03/29/2016, 3:01 PM  [place] [name] [address] [place], [place], [address] Phone: [phone]   Fax:  [phone]  Name: Edward [name] MRN: [id number] Date of Birth: 07-Nov-1933

## 2016-03-31 ENCOUNTER — Ambulatory Visit: Payer: Medicare Other | Admitting: Physical Therapy

## 2016-03-31 DIAGNOSIS — M545 Low back pain: Secondary | ICD-10-CM | POA: Diagnosis not present

## 2016-03-31 DIAGNOSIS — R262 Difficulty in walking, not elsewhere classified: Secondary | ICD-10-CM | POA: Diagnosis not present

## 2016-03-31 DIAGNOSIS — Z9181 History of falling: Secondary | ICD-10-CM | POA: Diagnosis not present

## 2016-03-31 DIAGNOSIS — R2681 Unsteadiness on feet: Secondary | ICD-10-CM | POA: Diagnosis not present

## 2016-03-31 DIAGNOSIS — M6281 Muscle weakness (generalized): Secondary | ICD-10-CM | POA: Diagnosis not present

## 2016-03-31 NOTE — Therapy (Signed)
[place], [place], [address] Phone: [phone]   Fax:  [phone]  Physical Therapy Treatment  Patient Details  Name: Edward [name] MRN: [id number] Date of Birth: [birth year] Referring Provider: Dr Charlann Boxer   Encounter Date: 03/31/2016      PT End of Session - 03/31/16 1638    Visit Number 7   Number of Visits 24   Date for PT Re-Evaluation 05/06/16   PT Start Time 0300   PT Stop Time 0345   PT Time Calculation (min) 45 min      Past Medical History  Diagnosis Date  . Hyperlipidemia   . Hypertension   . Elevated glucose   . Venous insufficiency of leg   . Drug-induced low platelet count     from NSAIDS  . Prostate cancer ([place])   . Complex renal cyst RIGHT--  MONITORED BY DR Risa Grill  . Chronic renal insufficiency     DR WEBB  . Nocturia   . Right knee injury     In past  . Crush injury, back   . Back pain     Difficulty walking- left leg pain  . Wears glasses   . Hearing loss   . History of swelling of feet   . Wears dentures     partial  . S/P radiation therapy 06/25/2015 through 08/19/2015    Prostate 7800 cGy in 40 sessions, seminal vesicles 5600 cGy in 40 sessions     Past Surgical History  Procedure Laterality Date  . Cryoablation  12/01/2012    Procedure: CRYO ABLATION PROSTATE;  Surgeon: Hanley Ben, MD;  Location: [facility];  Service: Urology;  Laterality: N/A;  . Quadriceps tendon repair Left 10/31/2013    Procedure: Left Quadriceps Tendon Repair;  Surgeon: Marybelle Killings, MD;  Location: [facility];  Service: Orthopedics;  Laterality: Left;  Left Quadriceps Tendon Repair  . Repair knee ligament Left 2014  . Colonoscopy  2/77/11    There were no vitals filed for this visit.      Subjective Assessment - 03/31/16 1639    Subjective Felt very sore today. Did a lot of work outside and feels like he overdid it. said he did not feel  any pain though.   Currently in Pain? No/denies                         [facility] Adult PT Treatment/Exercise - 03/31/16 0001    High Level Balance   High Level Balance Activities Tandem walking;Head turns;Sudden stops;Turns;Backward walking;Side stepping  able to turn head on airex with feet shoulder width.   High Level Balance Comments trunk rotations with narrow base.   Knee/Hip Exercises: Standing   Hip Abduction Stengthening;Both;10 reps   Other Standing Knee Exercises TKE, step-ups  green band. 4-inch step-up.   Other Standing Knee Exercises 10 sec standing eyes closed    on airex. feet shoulder width apart.   Knee/Hip Exercises: Seated   Long Arc Quad Strengthening;Left;10 reps  2 sets. 10 sec. hold on second set.                  PT Short Term Goals - 03/29/16 0854    PT SHORT TERM GOAL #1   Title Pt will be I with initial HEP for continued strengthening and mobility by 04/11/16    Time 4   Period Weeks   Status Achieved   PT  SHORT TERM GOAL #2   Title Pt will be able to demo posture and body mechanics as it relates to lumbar spine by 04/11/16.    Time 4   Period Weeks   Status On-going   PT SHORT TERM GOAL #3   Title BIlat HS length will improve to - 35 degrees bilat in 90/90 supine by 04/11/16.   Time 4   Period Weeks   Status Achieved           PT Long Term Goals - 03/22/16 1706    PT LONG TERM GOAL #1   Title FOTO score will improve from 65% limited to 47% limited to demo improved function and mobility by 05/05/16.     Time 8   Period Weeks   Status On-going   PT LONG TERM GOAL #2   Title BERG score will improve from 22/56 to 32/56 to improve mobility and decrease risk for falls by 05/05/16.    Time 8   Period Weeks   Status On-going   PT LONG TERM GOAL #3   Title Hip strength will improve to 4-/5 throughout to improve pts ability to perform sit to stand transfers by 05/05/16.    Time 8   Period Weeks   Status On-going   PT LONG  TERM GOAL #4   Title TUG will improve to <15 secs with appropriate Assistive device to decrease risk for falls and improve community mobility by 05/05/16.    Time 8   Period Weeks   Status On-going               Plan - 03/31/16 1612    Clinical Impression Statement C/O of being extra sore today because he worked outside. Making progress towards STG #2. Working to increase BERG score by practicing stand and reach as well as timed standing with eyes closed. Also worked on quad. strengthening. Left hip drops during TKE, which was somewhat correctable with VC'S. Needed VC'S to maintain upright posture. Stated that he had no pain at end of tx.   PT Next Visit Plan Allow him to hold onto parallel bars during hip abduction. Continue to focus on balance, particularly on airex. Continue to focus on body mechanics(keeping shoulders back, abs. in, looking straight ahead). Exercises that simulate the BERG test.      Patient will benefit from skilled therapeutic intervention in order to improve the following deficits and impairments:     Visit Diagnosis: Muscle weakness (generalized)  Unsteadiness on feet  Difficulty in walking, not elsewhere classified  Risk for falls     Problem List Patient Active Problem List   Diagnosis Date Noted  . Spinal stenosis, lumbar region, with neurogenic claudication 03/08/2016  . Arthritis of left lower extremity 09/25/2015  . Gait disorder 09/24/2015  . Edema 04/03/2014  . Rupture quadriceps tendon 11/12/2013    Class: Diagnosis of  . Prostate cancer ([place]) 09/29/2012  . Colon polyp 09/27/2011  . Well adult exam 09/27/2011  . LOW BACK PAIN 07/16/2008  . ACQUIRED CYST OF KIDNEY 01/18/2008  . Diet-controlled type 2 diabetes mellitus ([place]) 01/18/2008  . VENOUS INSUFFICIENCY 10/20/2007  . CRF (chronic renal failure) 10/20/2007  . HYPERLIPIDEMIA 09/12/2007  . Essential hypertension 09/12/2007  . HEMATURIA, MICROSCOPIC 09/12/2007    Dorene Ar, SPTA 03/31/2016, 5:29 PM   Hessie Diener, PTA 03/31/2016 5:29 PM Phone: [phone] Fax: [facility] [address] [place], [place], [address] Phone: [phone]  Fax:  [phone]  Name: Edward [name] MRN: [id number] Date of Birth: 1933-10-24

## 2016-04-05 ENCOUNTER — Ambulatory Visit (INDEPENDENT_AMBULATORY_CARE_PROVIDER_SITE_OTHER): Payer: Medicare Other | Admitting: Family Medicine

## 2016-04-05 ENCOUNTER — Encounter: Payer: Self-pay | Admitting: Family Medicine

## 2016-04-05 VITALS — BP 122/72 | HR 72 | Wt 207.0 lb

## 2016-04-05 DIAGNOSIS — M48062 Spinal stenosis, lumbar region with neurogenic claudication: Secondary | ICD-10-CM

## 2016-04-05 DIAGNOSIS — M129 Arthropathy, unspecified: Secondary | ICD-10-CM | POA: Diagnosis not present

## 2016-04-05 DIAGNOSIS — M4806 Spinal stenosis, lumbar region: Secondary | ICD-10-CM | POA: Diagnosis not present

## 2016-04-05 DIAGNOSIS — IMO0002 Reserved for concepts with insufficient information to code with codable children: Secondary | ICD-10-CM

## 2016-04-05 NOTE — Progress Notes (Signed)
[name] [place], [place] [address] Phone: [phone] Subjective:    I'm seeing this patient by the request  of:  Walker Kehr, MD   CC: Left leg pain follow up  [id number] Edward [name] is a 82 y.o. male coming in with complaint of left leg pain. Patient in 2013 did have a quadriceps tendon repair. Patient unfortunate since last time of seeing him in 2015 did have prostate cancer and has been going for treatment for radiation.   Patient saw me previously and was diagnosed with moderate osteophytic changes of the knee as well as a chronic meniscal tear.  Patient is following up now 3 months out from last injection. Patient states pain is starting to come back again. Continues with physical therapy. States that it has been helpful but has not made significant progress and strengthening. Continues to have some with the issues. Wears the brace occasionally.  Patient is also having some back pain.  She does have severe spinal stenosis of the back. Patient elected not have the epidural. Patient is considering it. Patient states still does not want the injection, continue weakness of the legs.      Past Medical History  Diagnosis Date  . Hyperlipidemia   . Hypertension   . Elevated glucose   . Venous insufficiency of leg   . Drug-induced low platelet count     from NSAIDS  . Prostate cancer ([place])   . Complex renal cyst RIGHT--  MONITORED BY DR Risa Grill  . Chronic renal insufficiency     DR WEBB  . Nocturia   . Right knee injury     In past  . Crush injury, back   . Back pain     Difficulty walking- left leg pain  . Wears glasses   . Hearing loss   . History of swelling of feet   . Wears dentures     partial  . S/P radiation therapy 06/25/2015 through 08/19/2015    Prostate 7800 cGy in 40 sessions, seminal vesicles 5600 cGy in 40 sessions    Past Surgical History  Procedure Laterality Date  .  Cryoablation  12/01/2012    Procedure: CRYO ABLATION PROSTATE;  Surgeon: Hanley Ben, MD;  Location: [facility];  Service: Urology;  Laterality: N/A;  . Quadriceps tendon repair Left 10/31/2013    Procedure: Left Quadriceps Tendon Repair;  Surgeon: Marybelle Killings, MD;  Location: [facility];  Service: Orthopedics;  Laterality: Left;  Left Quadriceps Tendon Repair  . Repair knee ligament Left 2014  . Colonoscopy  2/77/11   Social History  Substance Use Topics  . Smoking status: Never Smoker   . Smokeless tobacco: Never Used  . Alcohol Use: No   Allergies  Allergen Reactions  . Naproxen Sodium     REACTION: low plt     Past medical history, social, surgical and family history all reviewed in electronic medical record.   Review of Systems: No headache, visual changes, nausea, vomiting, diarrhea, constipation, dizziness, abdominal pain, skin rash, fevers, chills, night sweats, weight loss, swollen lymph nodes, body aches, joint swelling, muscle aches, chest pain, shortness of breath, mood changes.   Objective Blood pressure 122/72, pulse 72, weight 207 lb (93.895 kg).  General: No apparent distress alert and oriented x3 mood and affect normal, dressed appropriately.  HEENT: Pupils equal, extraocular movements intact  Respiratory: Patient's speak in full sentences and does not appear short of breath  Cardiovascular:  No lower extremity edema, non tender, no erythema  Skin: Warm dry intact with no signs of infection or rash on extremities or on axial skeleton.  Abdomen: Soft nontender  Neuro: Cranial nerves II through XII are intact, neurovascularly intact in all extremities. Very mild peripheral neuropathy in all limbs. with 2+ DTRs and 2+ pulses.  Lymph: No lymphadenopathy of posterior or anterior cervical chain or axillae bilaterally.  Gait patient does have some laxity of the knee with varus deformity an antalgic gait stiff lumbar spine.  MSK:  Non tender with full range  of motion and good stability and symmetric strength and tone of shoulders, elbows, wrist, hip, and ankles bilaterally.   Back exam shows the patient does have severe loss of motion of the lumbar spine. Has forward flexion of approximately 20 in extension of 5. Minimal rotation bilaterally but good side bending. Increasing stiffness in the paraspinal musculature of the lumbar spine bilaterally Negative straight leg test bilaterally  the patient does have more tightness of the left hamstring compared to the right side. Continued have significant weakness of the legs bilaterally.  Knee: Left Varus deformity of the knee noted Nontender on exam Instability with valgus force Negative Mcmurray's, Apley's, and Thessalonian tests. Non painful patellar compression. Patellar glide with mild crepitus. Patellar and quadriceps tendons unremarkable. Hamstring and quadriceps strength is normal.  Contralateral knee has mild varus deformity  After informed written and verbal consent, patient was seated on exam table. Left knee was prepped with alcohol swab and utilizing anterolateral approach, patient's left knee space was injected with 4:1  marcaine 0.5%: Kenalog 40mg /dL. Patient tolerated the procedure well without immediate complications.    Impression and Recommendations:     This case required medical decision making of moderate complexity.

## 2016-04-05 NOTE — Assessment & Plan Note (Signed)
Discussed with patient in great length. Patient does have severe spinal stenosis of the back. We discussed with patient about possible epidural steroid injection which patient does not want to do. Patient will continue to monitor. If worsening symptoms once again encourage it for diagnostic as well as hopefully therapeutic purposes.

## 2016-04-05 NOTE — Assessment & Plan Note (Signed)
Patient another injection today. Hopefully this will be beneficial. I do think that the weakness is secondary to his back. Patient still does not want an epidural. We discussed possible viscous supplementation and continued have pain. Follow-up in 6-8 weeks.

## 2016-04-05 NOTE — Patient Instructions (Signed)
Good to see you  Ice 20 minutes 2 times daily. Usually after activity and before bed. Use brace when you need it.  Consider the injection in the back  Continue the vitamins See me again in 6 weeks and if knee is worse we can do monovisc.

## 2016-04-06 ENCOUNTER — Ambulatory Visit: Payer: Medicare Other | Admitting: Physical Therapy

## 2016-04-06 DIAGNOSIS — M6281 Muscle weakness (generalized): Secondary | ICD-10-CM | POA: Diagnosis not present

## 2016-04-06 DIAGNOSIS — M545 Low back pain: Secondary | ICD-10-CM | POA: Diagnosis not present

## 2016-04-06 DIAGNOSIS — R262 Difficulty in walking, not elsewhere classified: Secondary | ICD-10-CM | POA: Diagnosis not present

## 2016-04-06 DIAGNOSIS — R2681 Unsteadiness on feet: Secondary | ICD-10-CM

## 2016-04-06 DIAGNOSIS — Z9181 History of falling: Secondary | ICD-10-CM | POA: Diagnosis not present

## 2016-04-06 NOTE — Therapy (Signed)
[place], [place], [address] Phone: [phone]   Fax:  [phone]  Physical Therapy Treatment  Patient Details  Name: Edward [name] MRN: [id number] Date of Birth: Mar 10, 1933 Referring Provider: Dr Charlann Boxer   Encounter Date: 04/06/2016      PT End of Session - 04/06/16 1303    Visit Number 8   Number of Visits 24   Date for PT Re-Evaluation 05/06/16   PT Start Time 1143   PT Stop Time 1230   PT Time Calculation (min) 47 min      Past Medical History  Diagnosis Date  . Hyperlipidemia   . Hypertension   . Elevated glucose   . Venous insufficiency of leg   . Drug-induced low platelet count     from NSAIDS  . Prostate cancer ([place])   . Complex renal cyst RIGHT--  MONITORED BY DR Risa Grill  . Chronic renal insufficiency     DR WEBB  . Nocturia   . Right knee injury     In past  . Crush injury, back   . Back pain     Difficulty walking- left leg pain  . Wears glasses   . Hearing loss   . History of swelling of feet   . Wears dentures     partial  . S/P radiation therapy 06/25/2015 through 08/19/2015    Prostate 7800 cGy in 40 sessions, seminal vesicles 5600 cGy in 40 sessions     Past Surgical History  Procedure Laterality Date  . Cryoablation  12/01/2012    Procedure: CRYO ABLATION PROSTATE;  Surgeon: Hanley Ben, MD;  Location: [facility];  Service: Urology;  Laterality: N/A;  . Quadriceps tendon repair Left 10/31/2013    Procedure: Left Quadriceps Tendon Repair;  Surgeon: Marybelle Killings, MD;  Location: [facility];  Service: Orthopedics;  Laterality: Left;  Left Quadriceps Tendon Repair  . Repair knee ligament Left 2014  . Colonoscopy  2/77/11    There were no vitals filed for this visit.      Subjective Assessment - 04/06/16 1247    Currently in Pain? No/denies                         [facility] Adult PT Treatment/Exercise -  04/06/16 0001    Ambulation/Gait   Ambulation/Gait Yes   Ambulation/Gait Assistance --  CGA   Ambulation Distance (Feet) 125 Feet   Assistive device None   Gait Pattern Lateral trunk lean to left;Narrow base of support   Ambulation Surface Level;Indoor   Gait Comments Cueing to    High Level Balance   High Level Balance Activities Head turns ,  airex pad. one finger for balance. CGA   Knee/Hip Exercises: Standing   Hip Abduction Stengthening;Both;10 reps   Knee/Hip Exercises: Seated   Long Arc Quad Strengthening;Left;10 reps  2 sets. 10 sec. hold on second set.3#   Marching Limitations 1x x 30 secs.   Knee/Hip Exercises: Supine   Quad Sets Strengthening;Both;2 sets;5 reps   Straight Leg Raises Strengthening;Both;2 sets;5 reps   Straight Leg Raises Limitations quad. lag bilat.                  PT Short Term Goals - 03/29/16 0854    PT SHORT TERM GOAL #1   Title Pt will be I with initial HEP for continued strengthening and mobility by 04/11/16    Time 4  Period Weeks   Status Achieved   PT SHORT TERM GOAL #2   Title Pt will be able to demo posture and body mechanics as it relates to lumbar spine by 04/11/16.    Time 4   Period Weeks   Status On-going   PT SHORT TERM GOAL #3   Title BIlat HS length will improve to - 35 degrees bilat in 90/90 supine by 04/11/16.   Time 4   Period Weeks   Status Achieved           PT Long Term Goals - 03/22/16 1706    PT LONG TERM GOAL #1   Title FOTO score will improve from 65% limited to 47% limited to demo improved function and mobility by 05/05/16.     Time 8   Period Weeks   Status On-going   PT LONG TERM GOAL #2   Title BERG score will improve from 22/56 to 32/56 to improve mobility and decrease risk for falls by 05/05/16.    Time 8   Period Weeks   Status On-going   PT LONG TERM GOAL #3   Title Hip strength will improve to 4-/5 throughout to improve pts ability to perform sit to stand transfers by 05/05/16.    Time  8   Period Weeks   Status On-going   PT LONG TERM GOAL #4   Title TUG will improve to <15 secs with appropriate Assistive device to decrease risk for falls and improve community mobility by 05/05/16.    Time 8   Period Weeks   Status On-going               Plan - 04/06/16 1252    Clinical Impression Statement Mentioned left leg was a little sore. Still struggles with having a normal gait pattern(walks tandem and loses balance to left). Strengthening his hip abductors may helps normalize gait. Mentioned that he felt sore in back of knee when he had to stand on left leg.   Clinical Impairments Affecting Rehab Potential --   PT Duration --   PT Treatment/Interventions --   PT Next Visit Plan Focus on hip strength, particularly abduction in sidelying. Retake Merrilee Jansky. Work on Personnel officer, focus on Economist.   PT Home Exercise Plan Use SPC for gait for safety.       Patient will benefit from skilled therapeutic intervention in order to improve the following deficits and impairments:  Abnormal gait, Decreased activity tolerance, Decreased balance, Decreased strength, Difficulty walking, Impaired flexibility, Impaired perceived functional ability, Decreased mobility, Improper body mechanics, Postural dysfunction, Impaired sensation, Decreased range of motion, Increased muscle spasms, Decreased knowledge of use of DME, Decreased safety awareness  Visit Diagnosis: Muscle weakness (generalized)  Unsteadiness on feet  Difficulty in walking, not elsewhere classified     Problem List Patient Active Problem List   Diagnosis Date Noted  . Spinal stenosis, lumbar region, with neurogenic claudication 03/08/2016  . Arthritis of left lower extremity 09/25/2015  . Gait disorder 09/24/2015  . Edema 04/03/2014  . Rupture quadriceps tendon 11/12/2013    Class: Diagnosis of  . Prostate cancer ([place]) 09/29/2012  . Colon polyp 09/27/2011  . Well adult exam 09/27/2011  . LOW BACK PAIN  07/16/2008  . ACQUIRED CYST OF KIDNEY 01/18/2008  . Diet-controlled type 2 diabetes mellitus ([place]) 01/18/2008  . VENOUS INSUFFICIENCY 10/20/2007  . CRF (chronic renal failure) 10/20/2007  . HYPERLIPIDEMIA 09/12/2007  . Essential hypertension 09/12/2007  . HEMATURIA, MICROSCOPIC 09/12/2007  Reva Bores 04/06/2016, 1:28 PM   Hessie Diener, PTA  [facility] [address] [place], [place], [address] Phone: [phone]   Fax:  [phone]  Name: Edward [name] MRN: [id number] Date of Birth: May 29, 1933

## 2016-04-08 ENCOUNTER — Ambulatory Visit: Payer: Medicare Other | Admitting: Physical Therapy

## 2016-04-08 DIAGNOSIS — H2513 Age-related nuclear cataract, bilateral: Secondary | ICD-10-CM | POA: Diagnosis not present

## 2016-04-08 DIAGNOSIS — M545 Low back pain, unspecified: Secondary | ICD-10-CM

## 2016-04-08 DIAGNOSIS — R262 Difficulty in walking, not elsewhere classified: Secondary | ICD-10-CM | POA: Diagnosis not present

## 2016-04-08 DIAGNOSIS — M6281 Muscle weakness (generalized): Secondary | ICD-10-CM | POA: Diagnosis not present

## 2016-04-08 DIAGNOSIS — Z9181 History of falling: Secondary | ICD-10-CM | POA: Diagnosis not present

## 2016-04-08 DIAGNOSIS — H401133 Primary open-angle glaucoma, bilateral, severe stage: Secondary | ICD-10-CM | POA: Diagnosis not present

## 2016-04-08 DIAGNOSIS — R2681 Unsteadiness on feet: Secondary | ICD-10-CM | POA: Diagnosis not present

## 2016-04-08 NOTE — Therapy (Signed)
[place], [place], [address] Phone: [phone]   Fax:  [phone]  Physical Therapy Treatment  Patient Details  Name: Edward [name] MRN: [id number] Date of Birth: [birth year] Referring Provider: Dr Charlann Boxer   Encounter Date: 04/08/2016      PT End of Session - 04/08/16 1520    Visit Number 9   Number of Visits 24   Date for PT Re-Evaluation 05/06/16   PT Start Time [id number]   PT Stop Time 1230   PT Time Calculation (min) 45 min   Activity Tolerance Patient tolerated treatment well   Behavior During Therapy [facility] for tasks assessed/performed      Past Medical History  Diagnosis Date  . Hyperlipidemia   . Hypertension   . Elevated glucose   . Venous insufficiency of leg   . Drug-induced low platelet count     from NSAIDS  . Prostate cancer ([place])   . Complex renal cyst RIGHT--  MONITORED BY DR Risa Grill  . Chronic renal insufficiency     DR WEBB  . Nocturia   . Right knee injury     In past  . Crush injury, back   . Back pain     Difficulty walking- left leg pain  . Wears glasses   . Hearing loss   . History of swelling of feet   . Wears dentures     partial  . S/P radiation therapy 06/25/2015 through 08/19/2015    Prostate 7800 cGy in 40 sessions, seminal vesicles 5600 cGy in 40 sessions     Past Surgical History  Procedure Laterality Date  . Cryoablation  12/01/2012    Procedure: CRYO ABLATION PROSTATE;  Surgeon: Hanley Ben, MD;  Location: [facility];  Service: Urology;  Laterality: N/A;  . Quadriceps tendon repair Left 10/31/2013    Procedure: Left Quadriceps Tendon Repair;  Surgeon: Marybelle Killings, MD;  Location: [place];  Service: Orthopedics;  Laterality: Left;  Left Quadriceps Tendon Repair  . Repair knee ligament Left 2014  . Colonoscopy  2/77/11    There were no vitals filed for this visit.      Subjective Assessment - 04/08/16 1517     Subjective Pt has no complaints today. He reports he has been walking in his house some without his cane despite his wifes objections. PT suggested he keep his cane handy.    Pertinent History HTN, DM, Venous insufficiency, Quad tendon, CRF, hyperlipidemia, LBP, stenosis with neurogenic claudication. arthrittis,    Limitations Standing   How long can you stand comfortably? 15 mins    How long can you walk comfortably? 30 mins with cart to hold onto    Currently in Pain? No/denies            [facility] PT Assessment - 04/08/16 0001    Berg Balance Test   Sit to Stand Able to stand  independently using hands   Standing Unsupported Able to stand safely 2 minutes   Sitting with Back Unsupported but Feet Supported on Floor or Stool Able to sit safely and securely 2 minutes   Stand to Sit Controls descent by using hands   Transfers Able to transfer safely, definite need of hands   Standing Unsupported with Eyes Closed Able to stand 3 seconds   Standing Ubsupported with Feet Together Able to place feet together independently and stand for 1 minute with supervision   From Standing, Reach Forward with Outstretched Arm  Can reach forward >12 cm safely (5")   From Standing Position, Pick up Object from [place] to pick up shoe, needs supervision   From Standing Position, Turn to Look Behind Over each Shoulder Turn sideways only but maintains balance   Turn 360 Degrees Needs close supervision or verbal cueing   Standing Unsupported, Alternately Place Feet on Step/Stool Able to complete >2 steps/needs minimal assist   Standing Unsupported, One Foot in Front Needs help to step but can hold 15 seconds   Standing on One Leg Unable to try or needs assist to prevent fall   Total Score 33   Timed Up and Go Test   Normal TUG (seconds) 17  w/o device                      [facility] Adult PT Treatment/Exercise - 04/08/16 0001    High Level Balance   High Level Balance Activities Head turns   airex pad. one finger for balance. CGA                  PT Short Term Goals - 04/08/16 1525    PT SHORT TERM GOAL #2   Title Pt will be able to demo posture and body mechanics as it relates to lumbar spine by 04/11/16.    Baseline Performing his HEP at home but could use some more strengthening    Time 4   Period Weeks   Status On-going   PT SHORT TERM GOAL #3   Title BIlat HS length will improve to - 35 degrees bilat in 90/90 supine by 04/11/16.   Time 4   Period Weeks   Status Achieved           PT Long Term Goals - 04/08/16 1525    PT LONG TERM GOAL #1   Title FOTO score will improve from 65% limited to 47% limited to demo improved function and mobility by 05/05/16.     Time 8   Period Weeks   Status On-going   PT LONG TERM GOAL #2   Title BERG score will improve from 22/56 to 32/56 to improve mobility and decrease risk for falls by 05/05/16.    Time 8   Period Weeks   Status On-going   PT LONG TERM GOAL #3   Title Hip strength will improve to 4-/5 throughout to improve pts ability to perform sit to stand transfers by 05/05/16.    Time 8   Period Weeks   Status On-going   PT LONG TERM GOAL #4   Title TUG will improve to <15 secs with appropriate Assistive device to decrease risk for falls and improve community mobility by 05/05/16.    Baseline 17 seconds    Status On-going               Plan - 04/08/16 1522    Clinical Impression Statement Patient scored 10 points better on his BERG balance scale and decreased his TUG score by 3 seconds. He is making progress. He continues to have some difficulty keeping his balance when he is turning his head. He also continues to have some L LE weakness. PT continues to encourage the patient to continue using his cane.  He is working towards all  goals but  has not achieved his goals yet. Continue 2w4 and re-assess at this time.    PT Frequency 3x / week   PT Duration 8 weeks   PT Treatment/Interventions ADLs/Self  Care Home Management;Therapeutic exercise;Therapeutic activities;Stair training;Gait training;Traction;Moist Heat;Balance training;Manual techniques;Taping;Passive range of motion;Neuromuscular re-education;DME Instruction;Patient/family education   PT Next Visit Plan Focus on hip strength, particularly abduction in sidelying. Retake Merrilee Jansky. Work on Personnel officer, focus on Economist.   PT Home Exercise Plan Use SPC for gait for safety.    Consulted and Agree with Plan of Care Patient     Neuro-re-ed: Balance testing and balance exercises to improve stability.  Patient will benefit from skilled therapeutic intervention in order to improve the following deficits and impairments:  Abnormal gait, Decreased activity tolerance, Decreased balance, Decreased strength, Difficulty walking, Impaired flexibility, Impaired perceived functional ability, Decreased mobility, Improper body mechanics, Postural dysfunction, Impaired sensation, Decreased range of motion, Increased muscle spasms, Decreased knowledge of use of DME, Decreased safety awareness  Visit Diagnosis: Difficulty in walking, not elsewhere classified - Plan: PT plan of care cert/re-cert  Muscle weakness (generalized) - Plan: PT plan of care cert/re-cert  Midline low back pain without sciatica - Plan: PT plan of care cert/re-cert     Problem List Patient Active Problem List   Diagnosis Date Noted  . Spinal stenosis, lumbar region, with neurogenic claudication 03/08/2016  . Arthritis of left lower extremity 09/25/2015  . Gait disorder 09/24/2015  . Edema 04/03/2014  . Rupture quadriceps tendon 11/12/2013    Class: Diagnosis of  . Prostate cancer ([place]) 09/29/2012  . Colon polyp 09/27/2011  . Well adult exam 09/27/2011  . LOW BACK PAIN 07/16/2008  . ACQUIRED CYST OF KIDNEY 01/18/2008  . Diet-controlled type 2 diabetes mellitus ([place]) 01/18/2008  . VENOUS INSUFFICIENCY 10/20/2007  . CRF (chronic renal failure) 10/20/2007  .  HYPERLIPIDEMIA 09/12/2007  . Essential hypertension 09/12/2007  . HEMATURIA, MICROSCOPIC 09/12/2007    Carney Living PT DPT  04/08/2016, 3:33 PM  [facility] [address] [place], [place], [address] Phone: [phone]   Fax:  [phone]  Name: Edward [name] MRN: [id number] Date of Birth: June 21, 1933

## 2016-04-12 ENCOUNTER — Ambulatory Visit: Payer: Medicare Other | Admitting: Physical Therapy

## 2016-04-12 DIAGNOSIS — M6281 Muscle weakness (generalized): Secondary | ICD-10-CM | POA: Diagnosis not present

## 2016-04-12 DIAGNOSIS — R2681 Unsteadiness on feet: Secondary | ICD-10-CM

## 2016-04-12 DIAGNOSIS — M545 Low back pain, unspecified: Secondary | ICD-10-CM

## 2016-04-12 DIAGNOSIS — R262 Difficulty in walking, not elsewhere classified: Secondary | ICD-10-CM | POA: Diagnosis not present

## 2016-04-12 DIAGNOSIS — Z9181 History of falling: Secondary | ICD-10-CM | POA: Diagnosis not present

## 2016-04-12 NOTE — Therapy (Signed)
[place], [place], [address] Phone: [phone]   Fax:  [phone]  Physical Therapy Treatment  Patient Details  Name: Edward [name] MRN: [id number] Date of Birth: January 12, 1933 Referring Provider: Dr Charlann Boxer   Encounter Date: 04/12/2016      PT End of Session - 04/12/16 1416    Visit Number 10   Number of Visits 24   Date for PT Re-Evaluation 05/06/16   PT Start Time [id number]   PT Stop Time 1230   PT Time Calculation (min) 45 min      Past Medical History  Diagnosis Date  . Hyperlipidemia   . Hypertension   . Elevated glucose   . Venous insufficiency of leg   . Drug-induced low platelet count     from NSAIDS  . Prostate cancer ([place])   . Complex renal cyst RIGHT--  MONITORED BY DR Risa Grill  . Chronic renal insufficiency     DR WEBB  . Nocturia   . Right knee injury     In past  . Crush injury, back   . Back pain     Difficulty walking- left leg pain  . Wears glasses   . Hearing loss   . History of swelling of feet   . Wears dentures     partial  . S/P radiation therapy 06/25/2015 through 08/19/2015    Prostate 7800 cGy in 40 sessions, seminal vesicles 5600 cGy in 40 sessions     Past Surgical History  Procedure Laterality Date  . Cryoablation  12/01/2012    Procedure: CRYO ABLATION PROSTATE;  Surgeon: Hanley Ben, MD;  [facility];  Service: Urology;  Laterality: N/A;  . Quadriceps tendon repair Left 10/31/2013    Procedure: Left Quadriceps Tendon Repair;  Surgeon: Marybelle Killings, MD;  Location: [place];  Service: Orthopedics;  Laterality: Left;  Left Quadriceps Tendon Repair  . Repair knee ligament Left 2014  . Colonoscopy  2/77/11    There were no vitals filed for this visit.      Subjective Assessment - 04/12/16 1355    Subjective No pain today.   Currently in Pain? No/denies            [facility] PT Assessment - 04/12/16 0001     Observation/Other Assessments   Focus on Therapeutic Outcomes (FOTO)  59% limited improved from 65% limited                     [facility] Adult PT Treatment/Exercise - 04/12/16 0001    Knee/Hip Exercises: Standing   Hip Abduction AROM;Stengthening;Both;2 sets   Other Standing Knee Exercises step-ups, toe taps,  step-ups to 8inch step, toe taps to 6inch step. 2x x 10.   Other Standing Knee Exercises head turns on airex, 360 on airex, pick up cone from chair. Also pertubations in all directions on and off foam with mostly posterior LOB 2x x 10   Knee/Hip Exercises: Seated   Long Arc Quad AROM;Strengthening;Both;2 sets   [employer] Weight 5 lbs.   Knee/Hip Exercises: Sidelying   Hip ABduction AROM;Strengthening;Both;2 sets  x 10 reps.                  PT Short Term Goals - 04/08/16 1525    PT SHORT TERM GOAL #2   Title Pt will be able to demo posture and body mechanics as it relates to lumbar spine by 04/11/16.  Baseline Performing his HEP at home but could use some more strengthening    Time 4   Period Weeks   Status On-going   PT SHORT TERM GOAL #3   Title BIlat HS length will improve to - 35 degrees bilat in 90/90 supine by 04/11/16.   Time 4   Period Weeks   Status Achieved           PT Long Term Goals - 04/08/16 1525    PT LONG TERM GOAL #1   Title FOTO score will improve from 65% limited to 47% limited to demo improved function and mobility by 05/05/16.     Time 8   Period Weeks   Status On-going   PT LONG TERM GOAL #2   Title BERG score will improve from 22/56 to 32/56 to improve mobility and decrease risk for falls by 05/05/16.    Time 8   Period Weeks   Status On-going   PT LONG TERM GOAL #3   Title Hip strength will improve to 4-/5 throughout to improve pts ability to perform sit to stand transfers by 05/05/16.    Time 8   Period Weeks   Status On-going   PT LONG TERM GOAL #4   Title TUG will improve to <15 secs with appropriate  Assistive device to decrease risk for falls and improve community mobility by 05/05/16.    Baseline 17 seconds    Status On-going               Plan - 04/12/16 1405    Clinical Impression Statement Needed VC's for TKE during step-ups with left leg. Less of a scissoring gait. Increased to 5lbs. on seated leg extensions. Still needs cues to maintain upright posture and not look down during exercises. Still loses balance when not using hands during head turns and trunk rotations turns on airex. Was able to do trunk rotations turns on level surface without LOB. Tried pertubations on and off foam with most LOB posterior. No pain after tx.   PT Next Visit Plan Continue to focus on hip strength and balance. Continue to cue for upright posture and for TKE. Possibly add resistance for abduction in sidelying. Core for proximal stability      Patient will benefit from skilled therapeutic intervention in order to improve the following deficits and impairments:  Abnormal gait, Decreased activity tolerance, Decreased balance, Decreased strength, Difficulty walking, Impaired flexibility, Impaired perceived functional ability, Decreased mobility, Improper body mechanics, Postural dysfunction, Impaired sensation, Decreased range of motion, Increased muscle spasms, Decreased knowledge of use of DME, Decreased safety awareness  Visit Diagnosis: Difficulty in walking, not elsewhere classified  Muscle weakness (generalized)  Midline low back pain without sciatica  Unsteadiness on feet     Problem List Patient Active Problem List   Diagnosis Date Noted  . Spinal stenosis, lumbar region, with neurogenic claudication 03/08/2016  . Arthritis of left lower extremity 09/25/2015  . Gait disorder 09/24/2015  . Edema 04/03/2014  . Rupture quadriceps tendon 11/12/2013    Class: Diagnosis of  . Prostate cancer ([place]) 09/29/2012  . Colon polyp 09/27/2011  . Well adult exam 09/27/2011  . LOW BACK PAIN  07/16/2008  . ACQUIRED CYST OF KIDNEY 01/18/2008  . Diet-controlled type 2 diabetes mellitus ([place]) 01/18/2008  . VENOUS INSUFFICIENCY 10/20/2007  . CRF (chronic renal failure) 10/20/2007  . HYPERLIPIDEMIA 09/12/2007  . Essential hypertension 09/12/2007  . HEMATURIA, MICROSCOPIC 09/12/2007  Reva Bores, Adin Hector, [place] 04/12/2016,  3:03 PM  [facility] [address] [place], [place], [address] Phone: [phone]   Fax:  [phone]  Name: Edward [name] MRN: [id number] Date of Birth: 1933-01-12

## 2016-04-14 ENCOUNTER — Ambulatory Visit: Payer: Medicare Other | Admitting: Physical Therapy

## 2016-04-14 DIAGNOSIS — R2681 Unsteadiness on feet: Secondary | ICD-10-CM

## 2016-04-14 DIAGNOSIS — M6281 Muscle weakness (generalized): Secondary | ICD-10-CM

## 2016-04-14 DIAGNOSIS — R262 Difficulty in walking, not elsewhere classified: Secondary | ICD-10-CM | POA: Diagnosis not present

## 2016-04-14 DIAGNOSIS — M545 Low back pain, unspecified: Secondary | ICD-10-CM

## 2016-04-14 DIAGNOSIS — Z9181 History of falling: Secondary | ICD-10-CM | POA: Diagnosis not present

## 2016-04-14 NOTE — Therapy (Signed)
[place], [place], [address] Phone: [phone]   Fax:  [phone]  Physical Therapy Treatment  Patient Details  Name: Edward [name] MRN: [id number] Date of Birth: 1933/11/13 Referring Provider: Dr Charlann Boxer   Encounter Date: 04/14/2016      PT End of Session - 04/14/16 1324    Visit Number 11   Number of Visits 24   Date for PT Re-Evaluation 05/06/16   PT Start Time [id number]   PT Stop Time 1230   PT Time Calculation (min) 45 min      Past Medical History  Diagnosis Date  . Hyperlipidemia   . Hypertension   . Elevated glucose   . Venous insufficiency of leg   . Drug-induced low platelet count     from NSAIDS  . Prostate cancer ([place])   . Complex renal cyst RIGHT--  MONITORED BY DR Risa Grill  . Chronic renal insufficiency     DR WEBB  . Nocturia   . Right knee injury     In past  . Crush injury, back   . Back pain     Difficulty walking- left leg pain  . Wears glasses   . Hearing loss   . History of swelling of feet   . Wears dentures     partial  . S/P radiation therapy 06/25/2015 through 08/19/2015    Prostate 7800 cGy in 40 sessions, seminal vesicles 5600 cGy in 40 sessions     Past Surgical History  Procedure Laterality Date  . Cryoablation  12/01/2012    Procedure: CRYO ABLATION PROSTATE;  Surgeon: Hanley Ben, MD;  Location: [facility];  Service: Urology;  Laterality: N/A;  . Quadriceps tendon repair Left 10/31/2013    Procedure: Left Quadriceps Tendon Repair;  Surgeon: Marybelle Killings, MD;  Location: [place];  Service: Orthopedics;  Laterality: Left;  Left Quadriceps Tendon Repair  . Repair knee ligament Left 2014  . Colonoscopy  2/77/11    There were no vitals filed for this visit.      Subjective Assessment - 04/14/16 1148    Subjective No pain, back hurts intermittently with standing    Currently in Pain? No/denies                          [facility] Adult PT Treatment/Exercise - 04/14/16 0001    Lumbar Exercises: Stretches   Active Hamstring Stretch 2 reps;30 seconds   Active Hamstring Stretch Limitations strap   Lumbar Exercises: Supine   Bridge 10 reps   Bridge Limitations then x 10 with green band clam   Straight Leg Raise 10 reps   Straight Leg Raises Limitations 2 sets with ab set   Knee/Hip Exercises: Sidelying   Hip ABduction AROM;Strengthening;Both;2 sets  x 10 reps.   Hip ABduction Limitations from pillows   Clams green band x 20 each then reverse clam x 20 each no band                  PT Short Term Goals - 04/14/16 1235    PT SHORT TERM GOAL #1   Title Pt will be I with initial HEP for continued strengthening and mobility by 04/11/16    Status Achieved   PT SHORT TERM GOAL #2   Title Pt will be able to demo posture and body mechanics as it relates to lumbar spine by 04/11/16.    Time 4  Period Weeks   Status On-going   PT SHORT TERM GOAL #3   Title BIlat HS length will improve to - 35 degrees bilat in 90/90 supine by 04/11/16.   Status Achieved           PT Long Term Goals - 04/08/16 1525    PT LONG TERM GOAL #1   Title FOTO score will improve from 65% limited to 47% limited to demo improved function and mobility by 05/05/16.     Time 8   Period Weeks   Status On-going   PT LONG TERM GOAL #2   Title BERG score will improve from 22/56 to 32/56 to improve mobility and decrease risk for falls by 05/05/16.    Time 8   Period Weeks   Status On-going   PT LONG TERM GOAL #3   Title Hip strength will improve to 4-/5 throughout to improve pts ability to perform sit to stand transfers by 05/05/16.    Time 8   Period Weeks   Status On-going   PT LONG TERM GOAL #4   Title TUG will improve to <15 secs with appropriate Assistive device to decrease risk for falls and improve community mobility by 05/05/16.    Baseline 17 seconds    Status On-going                Plan - 04/14/16 1148    Clinical Impression Statement Focued hip strength with mat exercises with cues for ab set and neutral spine.  Added pillows between knees with pt able to lift without assist from pillows during hip abduction in sidelying. Pt reports no increased LBP only hip soreness. Progressing toward LTG#3.    PT Next Visit Plan Continue to focus on hip strength and balance. Continue to cue for upright posture and for TKE. continue sidelying hip abduction with pillows,. Core for proximal stability      Patient will benefit from skilled therapeutic intervention in order to improve the following deficits and impairments:  Abnormal gait, Decreased activity tolerance, Decreased balance, Decreased strength, Difficulty walking, Impaired flexibility, Impaired perceived functional ability, Decreased mobility, Improper body mechanics, Postural dysfunction, Impaired sensation, Decreased range of motion, Increased muscle spasms, Decreased knowledge of use of DME, Decreased safety awareness  Visit Diagnosis: Difficulty in walking, not elsewhere classified  Muscle weakness (generalized)  Midline low back pain without sciatica  Unsteadiness on feet     Problem List Patient Active Problem List   Diagnosis Date Noted  . Spinal stenosis, lumbar region, with neurogenic claudication 03/08/2016  . Arthritis of left lower extremity 09/25/2015  . Gait disorder 09/24/2015  . Edema 04/03/2014  . Rupture quadriceps tendon 11/12/2013    Class: Diagnosis of  . Prostate cancer ([place]) 09/29/2012  . Colon polyp 09/27/2011  . Well adult exam 09/27/2011  . LOW BACK PAIN 07/16/2008  . ACQUIRED CYST OF KIDNEY 01/18/2008  . Diet-controlled type 2 diabetes mellitus ([place]) 01/18/2008  . VENOUS INSUFFICIENCY 10/20/2007  . CRF (chronic renal failure) 10/20/2007  . HYPERLIPIDEMIA 09/12/2007  . Essential hypertension 09/12/2007  . HEMATURIA, MICROSCOPIC 09/12/2007    Dorene Ar, PTA 04/14/2016, 1:29 PM  [place], [place], [address] Phone: [phone]   Fax:  [phone]  Name: Edward [name] MRN: [id number] Date of Birth: 01-04-1933

## 2016-04-16 ENCOUNTER — Ambulatory Visit: Payer: Medicare Other | Admitting: Physical Therapy

## 2016-04-16 DIAGNOSIS — M545 Low back pain, unspecified: Secondary | ICD-10-CM

## 2016-04-16 DIAGNOSIS — R262 Difficulty in walking, not elsewhere classified: Secondary | ICD-10-CM | POA: Diagnosis not present

## 2016-04-16 DIAGNOSIS — M6281 Muscle weakness (generalized): Secondary | ICD-10-CM

## 2016-04-16 DIAGNOSIS — R2681 Unsteadiness on feet: Secondary | ICD-10-CM | POA: Diagnosis not present

## 2016-04-16 DIAGNOSIS — Z9181 History of falling: Secondary | ICD-10-CM | POA: Diagnosis not present

## 2016-04-16 NOTE — Therapy (Addendum)
[place], [place], [address] Phone: [phone]   Fax:  [phone]  Physical Therapy Treatment  Patient Details  Name: Edward [name] MRN: [id number] Date of Birth: 17-Aug-1933 Referring Provider: Dr Charlann Boxer   Encounter Date: 04/16/2016      PT End of Session - 04/16/16 1010    Visit Number 12   Number of Visits 24   Date for PT Re-Evaluation 05/06/16   PT Start Time 0800   PT Stop Time 0845   PT Time Calculation (min) 45 min      Past Medical History  Diagnosis Date  . Hyperlipidemia   . Hypertension   . Elevated glucose   . Venous insufficiency of leg   . Drug-induced low platelet count     from NSAIDS  . Prostate cancer ([place])   . Complex renal cyst RIGHT--  MONITORED BY DR Risa Grill  . Chronic renal insufficiency     DR WEBB  . Nocturia   . Right knee injury     In past  . Crush injury, back   . Back pain     Difficulty walking- left leg pain  . Wears glasses   . Hearing loss   . History of swelling of feet   . Wears dentures     partial  . S/P radiation therapy 06/25/2015 through 08/19/2015    Prostate 7800 cGy in 40 sessions, seminal vesicles 5600 cGy in 40 sessions     Past Surgical History  Procedure Laterality Date  . Cryoablation  12/01/2012    Procedure: CRYO ABLATION PROSTATE;  Surgeon: Hanley Ben, MD;  Location: [facility];  Service: Urology;  Laterality: N/A;  . Quadriceps tendon repair Left 10/31/2013    Procedure: Left Quadriceps Tendon Repair;  Surgeon: Marybelle Killings, MD;  Location: [facility];  Service: Orthopedics;  Laterality: Left;  Left Quadriceps Tendon Repair  . Repair knee ligament Left 2014  . Colonoscopy  2/77/11    There were no vitals filed for this visit.      Subjective Assessment - 04/16/16 0946    Subjective Pt. stated that he had an 8/10 pain in hip before tx. but may be mistaking soreness for pain as he  said that he didn't feel too bad other than working out in the yard for a few hours and overdoing it.   Currently in Pain? Yes   Pain Score 8    Pain Location Hip                         [facility] Adult PT Treatment/Exercise - 04/16/16 0001    Lumbar Exercises: Stretches   Active Hamstring Stretch 2 reps;30 seconds   Active Hamstring Stretch Limitations strap   Knee/Hip Exercises: Standing   Hip Flexion AROM;Stengthening;Both;2 sets;10 reps   Hip Abduction AROM;Stengthening;2 sets;10 reps  Needed cues to look straight ahead.   Gait Training Cues to avoid scissoring gait,  Walking to airex pad.   Knee/Hip Exercises: Seated   Long Arc Quad AROM;Strengthening;Both;2 sets;10 reps   Long Arc Quad Weight 3 lbs.   Long Arc Quad Limitations Isometric holds for 3 secs. on second set.   Knee/Hip Exercises: Supine   Bridges Limitations 2x x 10  Second set with alternating arms.   Knee/Hip Exercises: Sidelying   Clams green band x 20 each then reverse clam x 20 each no band  PT Short Term Goals - 04/14/16 1235    PT SHORT TERM GOAL #1   Title Pt will be I with initial HEP for continued strengthening and mobility by 04/11/16    Status Achieved   PT SHORT TERM GOAL #2   Title Pt will be able to demo posture and body mechanics as it relates to lumbar spine by 04/11/16.    Time 4   Period Weeks   Status On-going   PT SHORT TERM GOAL #3   Title BIlat HS length will improve to - 35 degrees bilat in 90/90 supine by 04/11/16.   Status Achieved           PT Long Term Goals - 04/08/16 1525    PT LONG TERM GOAL #1   Title FOTO score will improve from 65% limited to 47% limited to demo improved function and mobility by 05/05/16.     Time 8   Period Weeks   Status On-going   PT LONG TERM GOAL #2   Title BERG score will improve from 22/56 to 32/56 to improve mobility and decrease risk for falls by 05/05/16.    Time 8   Period Weeks   Status On-going    PT LONG TERM GOAL #3   Title Hip strength will improve to 4-/5 throughout to improve pts ability to perform sit to stand transfers by 05/05/16.    Time 8   Period Weeks   Status On-going   PT LONG TERM GOAL #4   Title TUG will improve to <15 secs with appropriate Assistive device to decrease risk for falls and improve community mobility by 05/05/16.    Baseline 17 seconds    Status On-going        PHYSICAL THERAPY DISCHARGE SUMMARY  Visits from Start of Care:   Current functional level related to goals / functional outcomes: Unkown  Remaining deficits: Unknowns    Education / Equipment: Unknown  Plan: Patient agrees to discharge.  Patient goals were not met. Patient is being discharged due to not returning since the last visit.  ?????            Plan - 04/16/16 1003    Clinical Impression Statement Pt. stated that he had an 8/10 pain before tx. but might be confusing soreness for pain. Also stated that he had some pain during sidelying hip exercises but that might just be his muscles working that he feels. Focused on hip strength in sidelying, similar to last visit. Needed max VCs to correct gait pattern during walking in parallel bars so he would not have a scissoring gait. Needed VC's to not lean side to side during standing hip abduction. Progressing toward strength and balance goals.    PT Next Visit Plan Continue to focus on hip strength and balance. Continue to cue for upright posture and TKE. Continue hip and core strengthening.      Patient will benefit from skilled therapeutic intervention in order to improve the following deficits and impairments:  Abnormal gait, Decreased activity tolerance, Decreased balance, Decreased strength, Difficulty walking, Impaired flexibility, Impaired perceived functional ability, Decreased mobility, Improper body mechanics, Postural dysfunction, Impaired sensation, Decreased range of motion, Increased muscle spasms, Decreased knowledge of  use of DME, Decreased safety awareness  Visit Diagnosis: Difficulty in walking, not elsewhere classified  Muscle weakness (generalized)  Midline low back pain without sciatica  Unsteadiness on feet     Problem List Patient Active Problem List   Diagnosis Date Noted  .  Spinal stenosis, lumbar region, with neurogenic claudication 03/08/2016  . Arthritis of left lower extremity 09/25/2015  . Gait disorder 09/24/2015  . Edema 04/03/2014  . Rupture quadriceps tendon 11/12/2013    Class: Diagnosis of  . Prostate cancer ([place]) 09/29/2012  . Colon polyp 09/27/2011  . Well adult exam 09/27/2011  . LOW BACK PAIN 07/16/2008  . ACQUIRED CYST OF KIDNEY 01/18/2008  . Diet-controlled type 2 diabetes mellitus ([place]) 01/18/2008  . VENOUS INSUFFICIENCY 10/20/2007  . CRF (chronic renal failure) 10/20/2007  . HYPERLIPIDEMIA 09/12/2007  . Essential hypertension 09/12/2007  . HEMATURIA, MICROSCOPIC 09/12/2007    Reva Bores, [address] [place] 04/16/2016, 10:43 AM  [place], [place], [address] Phone: [phone]   Fax:  [phone]  Name: LE FAULCON MRN: [id number] Date of Birth: 12/19/1933

## 2016-04-23 ENCOUNTER — Ambulatory Visit (INDEPENDENT_AMBULATORY_CARE_PROVIDER_SITE_OTHER): Payer: Medicare Other | Admitting: Internal Medicine

## 2016-04-23 ENCOUNTER — Encounter: Payer: Self-pay | Admitting: Internal Medicine

## 2016-04-23 ENCOUNTER — Other Ambulatory Visit (INDEPENDENT_AMBULATORY_CARE_PROVIDER_SITE_OTHER): Payer: Medicare Other

## 2016-04-23 VITALS — BP 120/60 | HR 75 | Wt 204.0 lb

## 2016-04-23 DIAGNOSIS — R269 Unspecified abnormalities of gait and mobility: Secondary | ICD-10-CM | POA: Diagnosis not present

## 2016-04-23 DIAGNOSIS — I10 Essential (primary) hypertension: Secondary | ICD-10-CM

## 2016-04-23 DIAGNOSIS — E119 Type 2 diabetes mellitus without complications: Secondary | ICD-10-CM

## 2016-04-23 LAB — BASIC METABOLIC PANEL
BUN: 41 mg/dL — ABNORMAL HIGH (ref 6–23)
CHLORIDE: 103 meq/L (ref 96–112)
CO2: 28 mEq/L (ref 19–32)
CREATININE: 1.92 mg/dL — AB (ref 0.40–1.50)
Calcium: 9.5 mg/dL (ref 8.4–10.5)
GFR: 43.46 mL/min — AB (ref >60.00)
Glucose, Bld: 136 mg/dL — ABNORMAL HIGH (ref 70–99)
Potassium: 4.8 mEq/L (ref 3.5–5.1)
Sodium: 141 mEq/L (ref 135–145)

## 2016-04-23 LAB — HEMOGLOBIN A1C: HEMOGLOBIN A1C: 7.1 % — AB (ref 4.6–6.5)

## 2016-04-23 NOTE — Assessment & Plan Note (Signed)
Chronic OA - poss spinal stenosis Dr Tamala Julian Just finished PT

## 2016-04-23 NOTE — Assessment & Plan Note (Signed)
Chronic On Benicar, Dyazide

## 2016-04-23 NOTE — Progress Notes (Signed)
Subjective:  Patient ID: Edward [name], male    DOB: 06-25-1933  Age: 82 y.o. MRN: [id number]  CC: No chief complaint on file.   HPI Edward [name] presents for OA  Outpatient Prescriptions Prior to Visit  Medication Sig Dispense Refill  . aspirin 81 MG chewable tablet Chew 81 mg by mouth daily.    . Cholecalciferol (VITAMIN D-3) 1000 UNITS CAPS Take 1 capsule by mouth every morning.    . folic acid (FOLVITE) 1 MG tablet Take 1 tablet (1 mg total) by mouth daily. 90 tablet 3  . furosemide (LASIX) 20 MG tablet Take 1-2 tablets (20-40 mg total) by mouth daily as needed for edema. 90 tablet 3  . Multiple Vitamins-Minerals (CENTRUM SILVER) tablet Take 1 tablet by mouth daily.     . niacin (NIASPAN) 500 MG CR tablet Take 1 tablet (500 mg total) by mouth daily. 90 tablet 3  . olmesartan (BENICAR) 40 MG tablet Take 1 tablet (40 mg total) by mouth daily. 90 tablet 3  . pyridOXINE (VITAMIN B-6) 100 MG tablet Take 200 mg by mouth daily.    Marland Kitchen triamterene-hydrochlorothiazide (DYAZIDE) 37.5-25 MG capsule Take 1 each (1 capsule total) by mouth every morning. 90 capsule 3  . vitamin B-12 (CYANOCOBALAMIN) 1000 MCG tablet Take 1,000 mcg by mouth daily.    . Vitamin D, Ergocalciferol, (DRISDOL) 50000 UNITS CAPS capsule Take 1 capsule (50,000 Units total) by mouth every 30 (thirty) days. First of the month 3 capsule 3   No facility-administered medications prior to visit.    ROS Review of Systems  Constitutional: Negative for appetite change, fatigue and unexpected weight change.  HENT: Negative for congestion, nosebleeds, sneezing, sore throat and trouble swallowing.   Eyes: Negative for itching and visual disturbance.  Respiratory: Negative for cough.   Cardiovascular: Negative for chest pain, palpitations and leg swelling.  Gastrointestinal: Negative for nausea, diarrhea, blood in stool and abdominal distention.  Genitourinary: Negative for frequency and hematuria.  Musculoskeletal: Positive  for gait problem. Negative for back pain, joint swelling and neck pain.  Skin: Negative for rash.  Neurological: Negative for dizziness, tremors, speech difficulty and weakness.  Psychiatric/Behavioral: Negative for sleep disturbance, dysphoric mood and agitation. The patient is not nervous/anxious.     Objective:  Wt 204 lb (92.534 kg)  BP Readings from Last 3 Encounters:  04/05/16 122/72  03/08/16 126/76  01/05/16 124/72    Wt Readings from Last 3 Encounters:  04/23/16 204 lb (92.534 kg)  04/05/16 207 lb (93.895 kg)  03/08/16 206 lb (93.441 kg)    Physical Exam  Constitutional: He is oriented to person, place, and time. He appears well-developed. No distress.  NAD  HENT:  Mouth/Throat: Oropharynx is clear and moist.  Eyes: Conjunctivae are normal. Pupils are equal, round, and reactive to light.  Neck: Normal range of motion. No JVD present. No thyromegaly present.  Cardiovascular: Normal rate, regular rhythm, normal heart sounds and intact distal pulses.  Exam reveals no gallop and no friction rub.   No murmur heard. Pulmonary/Chest: Effort normal and breath sounds normal. No respiratory distress. He has no wheezes. He has no rales. He exhibits no tenderness.  Abdominal: Soft. Bowel sounds are normal. He exhibits no distension and no mass. There is no tenderness. There is no rebound and no guarding.  Musculoskeletal: Normal range of motion. He exhibits no edema or tenderness.  Lymphadenopathy:    He has no cervical adenopathy.  Neurological: He is alert and oriented to  person, place, and time. He has normal reflexes. No cranial nerve deficit. He exhibits normal muscle tone. He displays a negative Romberg sign. Coordination and gait normal.  Skin: Skin is warm and dry. No rash noted.  Psychiatric: He has a normal mood and affect. His behavior is normal. Judgment and thought content normal.    Lab Results  Component Value Date   WBC 4.7 09/24/2015   HGB 12.8* 09/24/2015    HCT 37.5* 09/24/2015   PLT 178.0 09/24/2015   GLUCOSE 132* 12/25/2015   CHOL 152 09/24/2015   TRIG 151.0* 09/24/2015   HDL 34.10* 09/24/2015   LDLCALC 88 09/24/2015   ALT 23 09/24/2015   AST 19 09/24/2015   NA 142 12/25/2015   K 5.0 12/25/2015   CL 103 12/25/2015   CREATININE 1.85* 12/25/2015   BUN 39* 12/25/2015   CO2 30 12/25/2015   TSH 1.34 09/24/2015   PSA 15.38* 09/29/2012   HGBA1C 6.8* 12/25/2015    Mr Lumbar Spine Wo Contrast  01/20/2016  CLINICAL DATA:  Low back pain and left leg pain week. Chronic weakness. History of prostate cancer. EXAM: MRI LUMBAR SPINE WITHOUT CONTRAST TECHNIQUE: Multiplanar, multisequence MR imaging of the lumbar spine was performed. No intravenous contrast was administered. COMPARISON:  Multiple exams, including 12/01/2015 and 05/24/2014 FINDINGS: The lowest lumbar type non-rib-bearing vertebra is labeled as L5. The conus medullaris appears normal. Conus level: L1. Intervertebral disc desiccation is observed at all levels between L1 and L4 with mild loss of disc height at L4-5. 7 mm anterolisthesis at L4-5 without pars defects. No other malalignment. No significant vertebral marrow edema is identified. Bilateral renal fluid signal intensity lesions favor cysts. Spurring of both sacroiliac joints. Additional findings at individual levels are as follows: L1-2:  No impingement.  Bilateral facet arthropathy. L2-3: Mild central narrowing of the thecal sac with borderline right subarticular lateral recess stenosis and borderline left foraminal stenosis due to disc bulge, left foraminal and lateral extraforaminal disc protrusion, and facet arthropathy. L3-4: Moderate central narrowing of the thecal sac with mild bilateral foraminal stenosis and mild right subarticular lateral recess stenosis due to disc bulge, facet arthropathy, and ligamentum flavum redundancy. L4-5: Prominent central narrowing of the thecal sac with mild bilateral foraminal stenosis and moderate  bilateral subarticular lateral recess stenosis due to disc uncovering, facet arthropathy, and ligamentum flavum redundancy. L5-S1: Mild left subarticular lateral recess stenosis with mild left and borderline right foraminal stenosis due to facet arthropathy and mild disc bulge along with intervertebral spurring. IMPRESSION: 1. Lumbar spondylosis and degenerative disc disease, causing prominent impingement at L4-5; moderate impingement at L3-4 ; mild impingement at L2-3 and L5-S1, as detailed above. 2.  Bilateral renal fluid signal intensity lesions favor cysts. 3. 7 mm degenerative anterolisthesis at L4-5; no pars defects identified. 4. No findings of metastatic disease to the lumbar spine. Electronically Signed   By: Van Clines M.D.   On: 01/20/2016 09:42    Assessment & Plan:   There are no diagnoses linked to this encounter. I am having Mr. Mesquita maintain his CENTRUM SILVER, aspirin, Vitamin D-3, Vitamin D (Ergocalciferol), olmesartan, folic acid, furosemide, niacin, triamterene-hydrochlorothiazide, pyridOXINE, and vitamin B-12.  No orders of the defined types were placed in this encounter.     Follow-up: No Follow-up on file.  Walker Kehr, MD

## 2016-04-23 NOTE — Assessment & Plan Note (Signed)
Dr Lorin Mercy, Dr Martha Clan

## 2016-04-23 NOTE — Progress Notes (Signed)
Pre visit review using our clinic review tool, if applicable. No additional management support is needed unless otherwise documented below in the visit note. 

## 2016-04-23 NOTE — Assessment & Plan Note (Signed)
Labs

## 2016-04-30 DIAGNOSIS — C61 Malignant neoplasm of prostate: Secondary | ICD-10-CM | POA: Diagnosis not present

## 2016-05-06 DIAGNOSIS — H401133 Primary open-angle glaucoma, bilateral, severe stage: Secondary | ICD-10-CM | POA: Diagnosis not present

## 2016-05-07 DIAGNOSIS — C61 Malignant neoplasm of prostate: Secondary | ICD-10-CM | POA: Diagnosis not present

## 2016-05-07 DIAGNOSIS — Z0000 Encounter for general adult medical examination without abnormal findings: Secondary | ICD-10-CM | POA: Diagnosis not present

## 2016-05-18 ENCOUNTER — Encounter: Payer: Self-pay | Admitting: Family Medicine

## 2016-05-18 ENCOUNTER — Ambulatory Visit (INDEPENDENT_AMBULATORY_CARE_PROVIDER_SITE_OTHER): Payer: Medicare Other | Admitting: Family Medicine

## 2016-05-18 VITALS — BP 118/70 | HR 82 | Ht 73.0 in | Wt 210.0 lb

## 2016-05-18 DIAGNOSIS — M48062 Spinal stenosis, lumbar region with neurogenic claudication: Secondary | ICD-10-CM

## 2016-05-18 DIAGNOSIS — M4806 Spinal stenosis, lumbar region: Secondary | ICD-10-CM

## 2016-05-18 NOTE — Patient Instructions (Signed)
Good to see you  I really think thi sis your back and getting worse.  We need to do something else and the epidural is the thing to do.  Ice when you need it.  Continue the medicines Once you have the epidural I would like to see you again 2-3 weeks after.  If knee is hurting then we will do the Monovisc.

## 2016-05-18 NOTE — Progress Notes (Signed)
[name] [place], [place] [address] Phone: [phone] Subjective:    I'm seeing this patient by the request  of:  Walker Kehr, MD   CC: Left leg pain follow up   [id number] Edward [name] is a 82 y.o. male coming in with complaint of left leg pain. Patient in 2013 did have a quadriceps tendon repair. Patient unfortunate since last time of seeing him in 2015 did have prostate cancer and has been going for treatment for radiation.   Patient saw me previously and was diagnosed with moderate osteophytic changes of the knee as well as a chronic meniscal tear.  Patient is following up now 3 months out from last injection. Patient states pain is starting to come back again. Finished with PT.  Doing ok.  Injection helped a little, more numbness. Concern for more  From his back   Patient is also having some back pain.  She does have severe spinal stenosis of the back. Having worsening weakness numbness in the legs. His fairly confident now that it is his back but still on some a Bea avoid injection and possible.     Past Medical History  Diagnosis Date  . Hyperlipidemia   . Hypertension   . Elevated glucose   . Venous insufficiency of leg   . Drug-induced low platelet count     from NSAIDS  . Prostate cancer ([place])   . Complex renal cyst RIGHT--  MONITORED BY DR Risa Grill  . Chronic renal insufficiency     DR WEBB  . Nocturia   . Right knee injury     In past  . Crush injury, back   . Back pain     Difficulty walking- left leg pain  . Wears glasses   . Hearing loss   . History of swelling of feet   . Wears dentures     partial  . S/P radiation therapy 06/25/2015 through 08/19/2015    Prostate 7800 cGy in 40 sessions, seminal vesicles 5600 cGy in 40 sessions    Past Surgical History  Procedure Laterality Date  . Cryoablation  12/01/2012    Procedure: CRYO ABLATION PROSTATE;  Surgeon: Hanley Ben, MD;   Location: [facility];  Service: Urology;  Laterality: N/A;  . Quadriceps tendon repair Left 10/31/2013    Procedure: Left Quadriceps Tendon Repair;  Surgeon: Marybelle Killings, MD;  Location: [facility];  Service: Orthopedics;  Laterality: Left;  Left Quadriceps Tendon Repair  . Repair knee ligament Left 2014  . Colonoscopy  2/77/11   Social History  Substance Use Topics  . Smoking status: Never Smoker   . Smokeless tobacco: Never Used  . Alcohol Use: No   Allergies  Allergen Reactions  . Naproxen Sodium     REACTION: low plt     Past medical history, social, surgical and family history all reviewed in electronic medical record.   Review of Systems: No headache, visual changes, nausea, vomiting, diarrhea, constipation, dizziness, abdominal pain, skin rash, fevers, chills, night sweats, weight loss, swollen lymph nodes, body aches, joint swelling, muscle aches, chest pain, shortness of breath, mood changes.   Objective Blood pressure 118/70, pulse 82, height 6\' 1"  (1.854 m), weight 210 lb (95.255 kg), SpO2 96 %.  General: No apparent distress alert and oriented x3 mood and affect normal, dressed appropriately.  HEENT: Pupils equal, extraocular movements intact  Respiratory: Patient's speak in full sentences and does not appear short of  breath  Cardiovascular: No lower extremity edema, non tender, no erythema  Skin: Warm dry intact with no signs of infection or rash on extremities or on axial skeleton.  Abdomen: Soft nontender  Neuro: Cranial nerves II through XII are intact, neurovascularly intact in all extremities. Very mild peripheral neuropathy in all limbs. with 2+ DTRs and 2+ pulses.  Lymph: No lymphadenopathy of posterior or anterior cervical chain or axillae bilaterally.  Gait patient does have some laxity of the knee with varus deformity an antalgic gait stiff lumbar spine.  MSK:  Non tender with full range of motion and good stability and symmetric strength and tone  of shoulders, elbows, wrist, hip, and ankles bilaterally.   Back exam shows the patient does have severe loss of motion of the lumbar spine. Has forward flexion of approximately 20 and extension of 5. Minimal rotation bilaterally but good side bending. Increasing stiffness in the paraspinal musculature of the lumbar spine bilaterally patient has significant tightness of the hamstrings bilaterally and a positive straight leg test on the left side. Increase weakness in the legs left leg has 3+ out of 5 strength compared to the contralateral side.  Knee: Left Varus deformity of the knee noted Nontender on exam Instability with valgus force Negative Mcmurray's, Apley's, and Thessalonian tests. Non painful patellar compression. Patellar glide with mild crepitus. Patellar and quadriceps tendons unremarkable. Hamstring and quadriceps strength is normal.  Contralateral knee has mild varus deformity Minimal change from previous exam    Impression and Recommendations:     This case required medical decision making of moderate complexity.

## 2016-05-18 NOTE — Assessment & Plan Note (Signed)
Patient is having worsening symptoms. I do believe that patient's spinal stenosis is worsening now he has chronic numbness and does have significant tightness compared to previous exam. Patient will finally agreed to have the epidural. We will see how this helps patient hasn't really respond. Patient will come back and see me again in 2 weeks after the injection and we'll see how patient is doing. Spent  25 minutes with patient face-to-face and had greater than 50% of counseling including as described above in assessment and plan.

## 2016-05-18 NOTE — Progress Notes (Signed)
Pre visit review using our clinic review tool, if applicable. No additional management support is needed unless otherwise documented below in the visit note. 

## 2016-05-25 ENCOUNTER — Ambulatory Visit
Admission: RE | Admit: 2016-05-25 | Discharge: 2016-05-25 | Disposition: A | Discharge status: Home / Self Care | Payer: Medicare Other | Source: Ambulatory Visit | Attending: Family Medicine | Admitting: Family Medicine

## 2016-05-25 DIAGNOSIS — M5136 Other intervertebral disc degeneration, lumbar region: Secondary | ICD-10-CM | POA: Diagnosis not present

## 2016-05-25 DIAGNOSIS — M5416 Radiculopathy, lumbar region: Secondary | ICD-10-CM

## 2016-05-25 MED ORDER — METHYLPREDNISOLONE ACETATE 40 MG/ML INJ SUSP (RADIOLOG
120.0000 mg | Freq: Once | INTRAMUSCULAR | Status: AC
  Administered 2016-05-25: 120 mg via EPIDURAL

## 2016-05-25 MED ORDER — IOPAMIDOL (ISOVUE-M 200) INJECTION 41%
1.0000 mL | Freq: Once | INTRAMUSCULAR | Status: AC
  Administered 2016-05-25: 1 mL via EPIDURAL

## 2016-05-25 NOTE — Discharge Instructions (Signed)

## 2016-06-15 ENCOUNTER — Encounter: Payer: Medicare Other | Admitting: Internal Medicine

## 2016-06-15 ENCOUNTER — Encounter: Payer: Self-pay | Admitting: Internal Medicine

## 2016-06-15 ENCOUNTER — Encounter: Payer: Self-pay | Admitting: Family Medicine

## 2016-06-15 ENCOUNTER — Ambulatory Visit (INDEPENDENT_AMBULATORY_CARE_PROVIDER_SITE_OTHER): Payer: Medicare Other | Admitting: Family Medicine

## 2016-06-15 DIAGNOSIS — M4806 Spinal stenosis, lumbar region: Secondary | ICD-10-CM

## 2016-06-15 DIAGNOSIS — M48062 Spinal stenosis, lumbar region with neurogenic claudication: Secondary | ICD-10-CM

## 2016-06-15 MED ORDER — NORTRIPTYLINE HCL 10 MG PO CAPS: 10.0000 mg | ORAL_CAPSULE | Freq: Every day | ORAL | Status: DC

## 2016-06-15 NOTE — Progress Notes (Signed)
[name] [address], [place] [address] Phone: [phone] Subjective:    I'm seeing this patient by the request  of:  Walker Kehr, MD   CC: Back pain follow up  [id number] RAYDAN LAUDERMILCH is a 82 y.o. male coming in with complaint of left leg pain. Patient in 2013 did have a quadriceps tendon repair. Patient unfortunate since last time of seeing him in 2015 did have prostate cancer and has been going for treatment for radiation.   Patient saw me previously and was diagnosed with moderate osteophytic changes of the knee as well as a chronic meniscal tear.  Patient is following up now 3 months out from last injection. Patient states pain is starting to come back again. Finished with PT.  Doing ok.  Injection helped a little, more numbness. Concern for more  From his back   Patient is also having some back pain.  She does have severe spinal stenosis of the back. Having worsening weakness numbness in the legs. Patient was sent for an MRI. Patient was found to have spinal stenosis. Was given an epidural. Patient is feeling better. States that the back pain is improved. Still has some weakness of the legs. Tightness still in the legs, patient wife states though he is improving. Wife has noticed improvements overall.  No new symptoms.     Past Medical History  Diagnosis Date  . Hyperlipidemia   . Hypertension   . Elevated glucose   . Venous insufficiency of leg   . Drug-induced low platelet count     from NSAIDS  . Prostate cancer ([place])   . Complex renal cyst RIGHT--  MONITORED BY DR Risa Grill  . Chronic renal insufficiency     DR WEBB  . Nocturia   . Right knee injury     In past  . Crush injury, back   . Back pain     Difficulty walking- left leg pain  . Wears glasses   . Hearing loss   . History of swelling of feet   . Wears dentures     partial  . S/P radiation therapy 06/25/2015 through 08/19/2015    Prostate 7800 cGy in 40  sessions, seminal vesicles 5600 cGy in 40 sessions    Past Surgical History  Procedure Laterality Date  . Cryoablation  12/01/2012    Procedure: CRYO ABLATION PROSTATE;  Surgeon: Hanley Ben, MD;  Location: [facility];  Service: Urology;  Laterality: N/A;  . Quadriceps tendon repair Left 10/31/2013    Procedure: Left Quadriceps Tendon Repair;  Surgeon: Marybelle Killings, MD;  Location: [place];  Service: Orthopedics;  Laterality: Left;  Left Quadriceps Tendon Repair  . Repair knee ligament Left 2014  . Colonoscopy  2/77/11   Social History  Substance Use Topics  . Smoking status: Never Smoker   . Smokeless tobacco: Never Used  . Alcohol Use: No   Allergies  Allergen Reactions  . Naproxen Sodium Other (See Comments)    low platelets     Past medical history, social, surgical and family history all reviewed in electronic medical record.   Review of Systems: No headache, visual changes, nausea, vomiting, diarrhea, constipation, dizziness, abdominal pain, skin rash, fevers, chills, night sweats, weight loss, swollen lymph nodes, body aches, joint swelling, muscle aches, chest pain, shortness of breath, mood changes.   Objective There were no vitals taken for this visit. Vitals   General: No apparent distress alert and  oriented x3 mood and affect normal, dressed appropriately.  HEENT: Pupils equal, extraocular movements intact  Respiratory: Patient's speak in full sentences and does not appear short of breath  Cardiovascular: No lower extremity edema, non tender, no erythema  Skin: Warm dry intact with no signs of infection or rash on extremities or on axial skeleton.  Abdomen: Soft nontender  Neuro: Cranial nerves II through XII are intact, neurovascularly intact in all extremities. Very mild peripheral neuropathy in all limbs. with 2+ DTRs and 2+ pulses.  Lymph: No lymphadenopathy of posterior or anterior cervical chain or axillae bilaterally.  Gait  still antalgic.  MSK:  Non tender with full range of motion and good stability and symmetric strength and tone of shoulders, elbows, wrist, hip, and ankles bilaterally.   Back exam shows the patient does have severe loss of motion of the lumbar spine. Section has improved to 25 degrees and extension has improved to 10. Still tightness hamstrings bilaterally.      Impression and Recommendations:     This case required medical decision making of moderate complexity.

## 2016-06-15 NOTE — Progress Notes (Signed)
Pre visit review using our clinic review tool, if applicable. No additional management support is needed unless otherwise documented below in the visit note. 

## 2016-06-15 NOTE — Assessment & Plan Note (Signed)
Patient did respond fairly well to an epidural steroid injection. States it is no longer having any back pain but continues to have the tightness in the legs bilaterally. I still think that this is radiculopathy. Likely some of this is postradiation secondary to prostate radiation. Discussed with patient about core strengthening. Patient come back and see me again in 4 weeks. We discussed with patient that we can repeat the epidural if necessary. Also discussed if continuing have pain mainly consider surgical intervention. Started nortriptyline as well.  Spent  25 minutes with patient face-to-face and had greater than 50% of counseling including as described above in assessment and plan.

## 2016-06-15 NOTE — Patient Instructions (Signed)
Good to see you  Ice is your friend I am happy so far but we can do another injection. Tell me what you think after the 4th of July  Nortriptyline 10mg  at night Stay active We will talk and discuss next step.

## 2016-07-07 DIAGNOSIS — H401133 Primary open-angle glaucoma, bilateral, severe stage: Secondary | ICD-10-CM | POA: Diagnosis not present

## 2016-08-03 ENCOUNTER — Telehealth: Payer: Self-pay | Admitting: Internal Medicine

## 2016-08-03 NOTE — Telephone Encounter (Signed)
Pt wife called in said that pt feet and legs are going numb again.  She wanted to see what Dr Tamala Julian wanted to do? She was requesting a call back from a nurse

## 2016-08-03 NOTE — Telephone Encounter (Signed)
Spoke to pt's wife. The pt does not want to have an epidural. Scheduled appt to see dr Tamala Julian 08/05/16 @ 1130am.

## 2016-08-03 NOTE — Telephone Encounter (Signed)
I would repeat epidural if he would like.  If he says he has please take this is a verbal order to repeat epidural.

## 2016-08-04 NOTE — Progress Notes (Signed)
[name] [address], [place] [address] Phone: [phone] Subjective:    I'm seeing this patient by the request  of:  Walker Kehr, MD   CC: Back pain follow up  [id number]  Edward [name] is a 82 y.o. male coming in with complaint of left leg pain. Patient in 2013 did have a quadriceps tendon repair. Patient unfortunate since last time of seeing him in 2015 did have prostate cancer and has been going for treatment for radiation.   Patient saw me previously and was diagnosed with moderate osteophytic changes of the knee as well as a chronic meniscal tear.  Patient is having worsening symptoms of the right knee again. Had significant swelling over the weekend. States it seems to be getting better but is concerned and will get worse again. Wearing the brace on a regular basis.  Patient is also having some back pain.  She does have severe spinal stenosis of the back. Having worsening weakness numbness in the legs. Patient was sent for an MRI. Patient was found to have spinal stenosis. Was given an epidural. . States that the back pain is improved. Still has some weakness of the legs. Actually seems to be worsening. Patient also has noticed more swelling in the legs bilaterally. He did stop his Lasix per his primary care provider. Patient though is having worsening numbness of the feet bilaterally. Having difficulty even picking up his feet. Denies any bowel or bladder incontinence. No new symptoms.     Past Medical History:  Diagnosis Date  . Back pain    Difficulty walking- left leg pain  . Chronic renal insufficiency    DR WEBB  . Complex renal cyst RIGHT--  MONITORED BY DR Risa Grill  . Crush injury, back   . Drug-induced low platelet count    from NSAIDS  . Elevated glucose   . Hearing loss   . History of swelling of feet   . Hyperlipidemia   . Hypertension   . Nocturia   . Prostate cancer ([place])   . Right knee injury    In past  .  S/P radiation therapy 06/25/2015 through 08/19/2015   Prostate 7800 cGy in 40 sessions, seminal vesicles 5600 cGy in 40 sessions   . Venous insufficiency of leg   . Wears dentures    partial  . Wears glasses    Past Surgical History:  Procedure Laterality Date  . COLONOSCOPY  2/77/11  . CRYOABLATION  12/01/2012   Procedure: CRYO ABLATION PROSTATE;  Surgeon: Hanley Ben, MD;  Location: [facility];  Service: Urology;  Laterality: N/A;  . QUADRICEPS TENDON REPAIR Left 10/31/2013   Procedure: Left Quadriceps Tendon Repair;  Surgeon: Marybelle Killings, MD;  Location: [place];  Service: Orthopedics;  Laterality: Left;  Left Quadriceps Tendon Repair  . REPAIR KNEE LIGAMENT Left 2014   Social History  Substance Use Topics  . Smoking status: Never Smoker  . Smokeless tobacco: Never Used  . Alcohol use No   Allergies  Allergen Reactions  . Naproxen Sodium Other (See Comments)    low platelets     Past medical history, social, surgical and family history all reviewed in electronic medical record.   Review of Systems: No headache, visual changes, nausea, vomiting, diarrhea, constipation, dizziness, abdominal pain, skin rash, fevers, chills, night sweats, weight loss, swollen lymph nodes, body aches, joint swelling, muscle aches, chest pain, shortness of breath, mood changes.   Objective  Blood pressure 116/64, pulse 90, weight 207 lb (93.9 kg), SpO2 96 %. Vitals   General: No apparent distress alert and oriented x3 mood and affect normal, dressed appropriately.  HEENT: Pupils equal, extraocular movements intact  Respiratory: Patient's speak in full sentences and does not appear short of breath  Cardiovascular: 2+ lower extremity edema, non tender, no erythema  Skin: Warm dry intact with no signs of infection or rash on extremities or on axial skeleton. The patient does have some discomfort in the feet bilaterally with mild cold to touch the patient does  have good dorsalis pedis. Abdomen: Soft nontender  Neuro: Cranial nerves II through XII are intact, neurovascularly intact in all extremities. Very mild peripheral neuropathy in all limbs. with 2+ DTRs and 2+ pulses.  Lymph: No lymphadenopathy of posterior or anterior cervical chain or axillae bilaterally.  Gait still antalgic.  MSK:  Non tender with full range of motion and good stability and symmetric strength and tone of shoulders, elbows, wrist, hips bilaterally.   Back exam shows the patient does have severe loss of motion of the lumbar spine. Section has improved to 25 degrees and extension has improved to 10. Still tightness hamstrings bilaterally. Does have 3+ out of 5 strength of the lower [place] bilaterally.  Right knee exam shows the patient does have severe crepitus. Tender to palpation diffusely. Trace effusion noted. Patient is lacking the last 10 of flexion and the last 5 of extension.  After informed written and verbal consent, patient was seated on exam table. Right knee was prepped with alcohol swab and utilizing anterolateral approach, patient's right knee space was injected with 4:1  marcaine 0.5%: Kenalog 40mg /dL. Patient tolerated the procedure well without immediate complications.    Impression and Recommendations:     This case required medical decision making of moderate complexity.

## 2016-08-05 ENCOUNTER — Encounter: Payer: Self-pay | Admitting: Family Medicine

## 2016-08-05 ENCOUNTER — Ambulatory Visit (INDEPENDENT_AMBULATORY_CARE_PROVIDER_SITE_OTHER): Payer: Medicare Other | Admitting: Family Medicine

## 2016-08-05 VITALS — BP 116/64 | HR 90 | Wt 207.0 lb

## 2016-08-05 DIAGNOSIS — M79605 Pain in left leg: Secondary | ICD-10-CM | POA: Diagnosis not present

## 2016-08-05 DIAGNOSIS — M48062 Spinal stenosis, lumbar region with neurogenic claudication: Secondary | ICD-10-CM

## 2016-08-05 DIAGNOSIS — M4806 Spinal stenosis, lumbar region: Secondary | ICD-10-CM | POA: Diagnosis not present

## 2016-08-05 DIAGNOSIS — M79604 Pain in right leg: Secondary | ICD-10-CM

## 2016-08-05 DIAGNOSIS — M1711 Unilateral primary osteoarthritis, right knee: Secondary | ICD-10-CM | POA: Diagnosis not present

## 2016-08-05 MED ORDER — NORTRIPTYLINE HCL 10 MG PO CAPS: 10.0000 mg | ORAL_CAPSULE | Freq: Every day | ORAL | 1 refills | Status: DC

## 2016-08-05 MED ORDER — FUROSEMIDE 20 MG PO TABS: 20.0000 mg | ORAL_TABLET | Freq: Once | ORAL | Status: DC

## 2016-08-05 NOTE — Assessment & Plan Note (Signed)
Worsening symptoms at this time. Patient having worsening peripheral neuropathy we will get an ABI to rule out any other vascular component compromise a could be contribute in. With patient having increasing peripheral edema patient was also put on Lasix again. We discussed icing regimen. Patient will be set up for an epidural for the back. We'll see me 2-3 weeks after the epidural to further discuss

## 2016-08-05 NOTE — Assessment & Plan Note (Signed)
Patient does have degenerative changes of the knee. Seems to be severe. Patient has weakness of the leg secondary to the spinal stenosis. We did discuss possible custom bracing which patient declined at the moment. Patient was given an injection and has tolerated previously. Hopefully this will be beneficial. We discussed icing regimen. We discussed avoiding certain activities. Patient will follow-up in 4 weeks. At that time if worsening symptoms she could be a candidate for viscous supplementation.

## 2016-08-05 NOTE — Patient Instructions (Signed)
Good to see you  We will get a test to look at the blood flow in your feet I think it is the swelling and I want you to use the lasix 3 times a week We will order the epidural again and would really think it will help Injected your knee today and should help See me again 2-3 weeks after the epidural

## 2016-08-05 NOTE — Assessment & Plan Note (Signed)
Started Lasix and nortriptyline.

## 2016-08-11 ENCOUNTER — Other Ambulatory Visit: Payer: Self-pay | Admitting: Family Medicine

## 2016-08-11 DIAGNOSIS — M79605 Pain in left leg: Principal | ICD-10-CM

## 2016-08-11 DIAGNOSIS — M79604 Pain in right leg: Secondary | ICD-10-CM

## 2016-08-13 ENCOUNTER — Ambulatory Visit (HOSPITAL_COMMUNITY)
Admission: RE | Admit: 2016-08-13 | Discharge: 2016-08-13 | Disposition: A | Discharge status: Home / Self Care | Payer: Medicare Other | Source: Ambulatory Visit | Attending: Cardiology | Admitting: Cardiology

## 2016-08-13 DIAGNOSIS — R0989 Other specified symptoms and signs involving the circulatory and respiratory systems: Secondary | ICD-10-CM | POA: Diagnosis not present

## 2016-08-13 DIAGNOSIS — E785 Hyperlipidemia, unspecified: Secondary | ICD-10-CM | POA: Insufficient documentation

## 2016-08-13 DIAGNOSIS — M79604 Pain in right leg: Secondary | ICD-10-CM | POA: Insufficient documentation

## 2016-08-13 DIAGNOSIS — I10 Essential (primary) hypertension: Secondary | ICD-10-CM | POA: Insufficient documentation

## 2016-08-13 DIAGNOSIS — I739 Peripheral vascular disease, unspecified: Secondary | ICD-10-CM | POA: Diagnosis present

## 2016-08-13 DIAGNOSIS — M79605 Pain in left leg: Secondary | ICD-10-CM | POA: Diagnosis not present

## 2016-08-20 DIAGNOSIS — N2581 Secondary hyperparathyroidism of renal origin: Secondary | ICD-10-CM | POA: Diagnosis not present

## 2016-08-20 DIAGNOSIS — N2889 Other specified disorders of kidney and ureter: Secondary | ICD-10-CM | POA: Diagnosis not present

## 2016-08-20 DIAGNOSIS — Z6829 Body mass index (BMI) 29.0-29.9, adult: Secondary | ICD-10-CM | POA: Diagnosis not present

## 2016-08-20 DIAGNOSIS — N183 Chronic kidney disease, stage 3 (moderate): Secondary | ICD-10-CM | POA: Diagnosis not present

## 2016-08-30 ENCOUNTER — Other Ambulatory Visit: Payer: Self-pay | Admitting: *Deleted

## 2016-08-30 DIAGNOSIS — M48062 Spinal stenosis, lumbar region with neurogenic claudication: Secondary | ICD-10-CM

## 2016-09-03 DIAGNOSIS — C61 Malignant neoplasm of prostate: Secondary | ICD-10-CM | POA: Diagnosis not present

## 2016-09-07 ENCOUNTER — Ambulatory Visit
Admission: RE | Admit: 2016-09-07 | Discharge: 2016-09-07 | Disposition: A | Discharge status: Home / Self Care | Payer: Medicare Other | Source: Ambulatory Visit | Attending: Family Medicine | Admitting: Family Medicine

## 2016-09-07 DIAGNOSIS — M4806 Spinal stenosis, lumbar region: Secondary | ICD-10-CM | POA: Diagnosis not present

## 2016-09-07 DIAGNOSIS — M48062 Spinal stenosis, lumbar region with neurogenic claudication: Secondary | ICD-10-CM

## 2016-09-07 MED ORDER — IOPAMIDOL (ISOVUE-M 200) INJECTION 41%
1.0000 mL | Freq: Once | INTRAMUSCULAR | Status: AC
  Administered 2016-09-07: 1 mL via EPIDURAL

## 2016-09-07 MED ORDER — METHYLPREDNISOLONE ACETATE 40 MG/ML INJ SUSP (RADIOLOG
120.0000 mg | Freq: Once | INTRAMUSCULAR | Status: AC
  Administered 2016-09-07: 120 mg via EPIDURAL

## 2016-09-07 NOTE — Discharge Instructions (Signed)

## 2016-09-10 DIAGNOSIS — C61 Malignant neoplasm of prostate: Secondary | ICD-10-CM | POA: Diagnosis not present

## 2016-09-10 DIAGNOSIS — N401 Enlarged prostate with lower urinary tract symptoms: Secondary | ICD-10-CM | POA: Diagnosis not present

## 2016-09-10 DIAGNOSIS — R351 Nocturia: Secondary | ICD-10-CM | POA: Diagnosis not present

## 2016-09-29 NOTE — Progress Notes (Signed)
[name] [address], [place] [address] Phone: [phone] Subjective:    I'm seeing this patient by the request  of:  Edward Kehr, MD   CC: Back pain follow up  [id number]  Edward [name] is a 82 y.o. male coming in with complaint of Bilateral leg pain and back pain  Patient has moderate to severe arthritis of the right knee. Was given an injection 08/05/2016. Patient states he is feeling much better.  Patient is also having some back pain.  He does have severe spinal stenosis of the back. Having worsening weakness numbness in the legs. Patient was sent for an MRI. Patient was found to have spinal stenosis. Was given an epidural. . Patient was having improvement but was having worsening leg pain again. Had another epidural in September. This was one month ago. Patient states this time and did not seem to help. Having worsening symptoms in the leg. Patient states that he feels like involuntary moment. 9 worsening pain in the back as well as canal constant numbness in the feet. Significant feeling worsening symptoms.   Past Medical History:  Diagnosis Date  . Back pain    Difficulty walking- left leg pain  . Chronic renal insufficiency    DR WEBB  . Complex renal cyst RIGHT--  MONITORED BY DR Risa Grill  . Crush injury, back   . Drug-induced low platelet count    from NSAIDS  . Elevated glucose   . Hearing loss   . History of swelling of feet   . Hyperlipidemia   . Hypertension   . Nocturia   . Prostate cancer ([name])   . Right knee injury    In past  . S/P radiation therapy 06/25/2015 through 08/19/2015   Prostate 7800 cGy in 40 sessions, seminal vesicles 5600 cGy in 40 sessions   . Venous insufficiency of leg   . Wears dentures    partial  . Wears glasses    Past Surgical History:  Procedure Laterality Date  . COLONOSCOPY  2/77/11  . CRYOABLATION  12/01/2012   Procedure: CRYO ABLATION PROSTATE;   Surgeon: Hanley Ben, MD;  Location: [facility];  Service: Urology;  Laterality: N/A;  . QUADRICEPS TENDON REPAIR Left 10/31/2013   Procedure: Left Quadriceps Tendon Repair;  Surgeon: Marybelle Killings, MD;  Location: [place];  Service: Orthopedics;  Laterality: Left;  Left Quadriceps Tendon Repair  . REPAIR KNEE LIGAMENT Left 2014   Social History  Substance Use Topics  . Smoking status: Never Smoker  . Smokeless tobacco: Never Used  . Alcohol use No   Allergies  Allergen Reactions  . Naproxen Sodium Other (See Comments)    low platelets     Past medical history, social, surgical and family history all reviewed in electronic medical record.   Review of Systems: No headache, visual changes, nausea, vomiting, diarrhea, constipation, dizziness, abdominal pain, skin rash, fevers, chills, night sweats, weight loss, swollen lymph nodes, body aches, joint swelling, muscle aches, chest pain, shortness of breath, mood changes.   Objective  Blood pressure 130/74, pulse 73, SpO2 97 %. Vitals   General: No apparent distress alert and oriented x3 mood and affect normal, dressed appropriately.  HEENT: Pupils equal, extraocular movements intact  Respiratory: Patient's speak in full sentences and does not appear short of breath  Cardiovascular: 2+ lower extremity edema, non tender, no erythema  Skin: Warm dry intact with no signs of infection or rash on extremities  or on axial skeleton. The patient does have some discomfort in the feet bilaterally with mild cold to touch the patient does have good dorsalis pedis. Abdomen: Soft nontender  Neuro: Cranial nerves II through XII are intact, neurovascularly intact in all extremities. Very mild peripheral neuropathy in all limbs. with 1+ DTRs and 1+ pulses.  Lymph: No lymphadenopathy of posterior or anterior cervical chain or axillae bilaterally.  Gait Severely antalgic gait MSK:  Non tender with full range of motion and good stability and  symmetric strength and tone of shoulders, elbows, wrist, hips bilaterally.   Back exam shows the patient does have severe loss of motion of the lumbar spine. Patient has minimal extension of the back. Forward flexion of 30..  Right knee exam shows the patient does have severe crepitus. Tender to palpation diffusely. Trace effusion noted. Patient is lacking the last 10 of flexion and the last 5 of extension. Positive straight leg test bilaterally. Neurovascularly patient no longer has any sensation of the dorsal aspect of the feet. Weakness with 3+ out of 5 strength bilaterally left side is stronger than the right      Impression and Recommendations:     This case required medical decision making of moderate complexity.

## 2016-09-30 ENCOUNTER — Ambulatory Visit (INDEPENDENT_AMBULATORY_CARE_PROVIDER_SITE_OTHER): Payer: Medicare Other | Admitting: Family Medicine

## 2016-09-30 ENCOUNTER — Encounter: Payer: Self-pay | Admitting: Family Medicine

## 2016-09-30 VITALS — BP 130/74 | HR 73

## 2016-09-30 DIAGNOSIS — M48062 Spinal stenosis, lumbar region with neurogenic claudication: Secondary | ICD-10-CM

## 2016-09-30 NOTE — Assessment & Plan Note (Signed)
Discussed with patient again at great length. We discussed with him now having chronic weakness as well as numbness that he needs to seek emergent potential surgery. We discussed patient if any bowel or bladder incontinence to go to the emergency room immediately. Urgent referral put in for neurosurgery for further evaluation. Patient is to continue the same medications. Patient has been hesitant more aggressive therapy but now understands that this could be a chronic problem if not.with appropriately. Patient is in agreement with plan and will call if has any questions.  Spent  25 minutes with patient face-to-face and had greater than 50% of counseling including as described above in assessment and plan.

## 2016-10-07 ENCOUNTER — Telehealth: Payer: Self-pay | Admitting: Family Medicine

## 2016-10-07 NOTE — Telephone Encounter (Signed)
Made pt aware that his records were sent to [facility] & that it may take a couple of weeks for the Doctor to review his chart before they call to schedule the appt. Pt understood.

## 2016-10-07 NOTE — Telephone Encounter (Signed)
Would like to know if Dr. Tamala Julian was able to get in touch with surgeon?

## 2016-10-14 ENCOUNTER — Other Ambulatory Visit: Payer: Self-pay | Admitting: Neurosurgery

## 2016-10-14 DIAGNOSIS — M5137 Other intervertebral disc degeneration, lumbosacral region: Secondary | ICD-10-CM | POA: Diagnosis not present

## 2016-10-25 ENCOUNTER — Ambulatory Visit (INDEPENDENT_AMBULATORY_CARE_PROVIDER_SITE_OTHER): Payer: Medicare Other | Admitting: Internal Medicine

## 2016-10-25 ENCOUNTER — Encounter: Payer: Self-pay | Admitting: Internal Medicine

## 2016-10-25 DIAGNOSIS — M48062 Spinal stenosis, lumbar region with neurogenic claudication: Secondary | ICD-10-CM

## 2016-10-25 DIAGNOSIS — M545 Low back pain: Secondary | ICD-10-CM | POA: Diagnosis not present

## 2016-10-25 DIAGNOSIS — E119 Type 2 diabetes mellitus without complications: Secondary | ICD-10-CM

## 2016-10-25 DIAGNOSIS — G8929 Other chronic pain: Secondary | ICD-10-CM

## 2016-10-25 DIAGNOSIS — R269 Unspecified abnormalities of gait and mobility: Secondary | ICD-10-CM | POA: Diagnosis not present

## 2016-10-25 DIAGNOSIS — I10 Essential (primary) hypertension: Secondary | ICD-10-CM | POA: Diagnosis not present

## 2016-10-25 MED ORDER — VITAMIN D (ERGOCALCIFEROL) 1.25 MG (50000 UNIT) PO CAPS: 50000.0000 [IU] | ORAL_CAPSULE | ORAL | 3 refills | Status: DC

## 2016-10-25 MED ORDER — FOLIC ACID 1 MG PO TABS: 1.0000 mg | ORAL_TABLET | Freq: Every day | ORAL | 3 refills | Status: DC

## 2016-10-25 MED ORDER — OLMESARTAN MEDOXOMIL 40 MG PO TABS: 40.0000 mg | ORAL_TABLET | Freq: Every day | ORAL | 3 refills | Status: DC

## 2016-10-25 MED ORDER — NIACIN ER (ANTIHYPERLIPIDEMIC) 500 MG PO TBCR: 500.0000 mg | EXTENDED_RELEASE_TABLET | Freq: Every day | ORAL | 3 refills | Status: DC

## 2016-10-25 MED ORDER — TRIAMTERENE-HCTZ 37.5-25 MG PO CAPS: 1.0000 | ORAL_CAPSULE | Freq: Every morning | ORAL | 3 refills | Status: DC

## 2016-10-25 NOTE — Assessment & Plan Note (Signed)
Labs

## 2016-10-25 NOTE — Progress Notes (Signed)
Pre visit review using our clinic review tool, if applicable. No additional management support is needed unless otherwise documented below in the visit note. 

## 2016-10-25 NOTE — Progress Notes (Signed)
Subjective:  Patient ID: Edward [name], male    DOB: 1933/04/25  Age: 82 y.o. MRN: [id number]  CC: No chief complaint on file.   HPI Edward [name] presents for LBP - Dr Saintclair Halsted will operate on Thur; HTN, Vit B12 def f/u  Outpatient Medications Prior to Visit  Medication Sig Dispense Refill  . aspirin EC 81 MG tablet Take 81 mg by mouth daily.    . brimonidine (ALPHAGAN P) 0.1 % SOLN Place 1 drop into both eyes 2 (two) times daily.    . Cholecalciferol (VITAMIN D-3) 1000 UNITS CAPS Take 2,000 Units by mouth every morning.     . folic acid (FOLVITE) 1 MG tablet Take 1 tablet (1 mg total) by mouth daily. 90 tablet 3  . furosemide (LASIX) 20 MG tablet Take 20mg s by mouth 3 times per week as needed for swelling    . latanoprost (XALATAN) 0.005 % ophthalmic solution Place 1 drop into both eyes at bedtime.    . Multiple Vitamins-Minerals (CENTRUM SILVER) tablet Take 1 tablet by mouth daily. Reported on 05/25/2016    . niacin (NIASPAN) 500 MG CR tablet Take 1 tablet (500 mg total) by mouth daily. 90 tablet 3  . nortriptyline (PAMELOR) 10 MG capsule Take 1 capsule (10 mg total) by mouth at bedtime. 90 capsule 1  . olmesartan (BENICAR) 40 MG tablet Take 1 tablet (40 mg total) by mouth daily. 90 tablet 3  . pyridOXINE (VITAMIN B-6) 100 MG tablet Take 200 mg by mouth daily.    Marland Kitchen triamterene-hydrochlorothiazide (DYAZIDE) 37.5-25 MG capsule Take 1 each (1 capsule total) by mouth every morning. 90 capsule 3  . vitamin B-12 (CYANOCOBALAMIN) 1000 MCG tablet Take 1,000 mcg by mouth daily.    . Vitamin D, Ergocalciferol, (DRISDOL) 50000 UNITS CAPS capsule Take 1 capsule (50,000 Units total) by mouth every 30 (thirty) days. First of the month 3 capsule 3   Facility-Administered Medications Prior to Visit  Medication Dose Route Frequency Provider Last Rate Last Dose  . furosemide (LASIX) tablet 20 mg  20 mg Oral Once Lyndal Pulley, DO        ROS Review of Systems  Constitutional: Negative for  appetite change, fatigue and unexpected weight change.  HENT: Negative for congestion, nosebleeds, sneezing, sore throat and trouble swallowing.   Eyes: Negative for itching and visual disturbance.  Respiratory: Negative for cough.   Cardiovascular: Negative for chest pain, palpitations and leg swelling.  Gastrointestinal: Negative for abdominal distention, blood in stool, diarrhea and nausea.  Genitourinary: Negative for frequency and hematuria.  Musculoskeletal: Positive for back pain and gait problem. Negative for joint swelling and neck pain.  Skin: Negative for rash.  Neurological: Negative for dizziness, tremors, speech difficulty and weakness.  Psychiatric/Behavioral: Negative for agitation, dysphoric mood and sleep disturbance. The patient is not nervous/anxious.     Objective:  BP 118/74   Pulse 87   Wt 205 lb (93 kg)   SpO2 95%   BMI 27.05 kg/m   BP Readings from Last 3 Encounters:  10/25/16 118/74  09/30/16 130/74  09/07/16 140/80    Wt Readings from Last 3 Encounters:  10/25/16 205 lb (93 kg)  08/05/16 207 lb (93.9 kg)  06/15/16 209 lb (94.8 kg)    Physical Exam  Constitutional: He is oriented to person, place, and time. He appears well-developed. No distress.  NAD  HENT:  Mouth/Throat: Oropharynx is clear and moist.  Eyes: Conjunctivae are normal. Pupils are equal, round, and  reactive to light.  Neck: Normal range of motion. No JVD present. No thyromegaly present.  Cardiovascular: Normal rate, regular rhythm, normal heart sounds and intact distal pulses.  Exam reveals no gallop and no friction rub.   No murmur heard. Pulmonary/Chest: Effort normal and breath sounds normal. No respiratory distress. He has no wheezes. He has no rales. He exhibits no tenderness.  Abdominal: Soft. Bowel sounds are normal. He exhibits no distension and no mass. There is no tenderness. There is no rebound and no guarding.  Musculoskeletal: Normal range of motion. He exhibits  tenderness. He exhibits no edema.  Lymphadenopathy:    He has no cervical adenopathy.  Neurological: He is alert and oriented to person, place, and time. He has normal reflexes. No cranial nerve deficit. He exhibits normal muscle tone. He displays a negative Romberg sign. Coordination abnormal. Gait normal.  Skin: Skin is warm and dry. No rash noted.  Psychiatric: He has a normal mood and affect. His behavior is normal. Judgment and thought content normal.  Cane Ataxic LLE>>RLE - weak   Lab Results  Component Value Date   WBC 4.7 09/24/2015   HGB 12.8 (L) 09/24/2015   HCT 37.5 (L) 09/24/2015   PLT 178.0 09/24/2015   GLUCOSE 136 (H) 04/23/2016   CHOL 152 09/24/2015   TRIG 151.0 (H) 09/24/2015   HDL 34.10 (L) 09/24/2015   LDLCALC 88 09/24/2015   ALT 23 09/24/2015   AST 19 09/24/2015   NA 141 04/23/2016   K 4.8 04/23/2016   CL 103 04/23/2016   CREATININE 1.92 (H) 04/23/2016   BUN 41 (H) 04/23/2016   CO2 28 04/23/2016   TSH 1.34 09/24/2015   PSA 15.38 (H) 09/29/2012   HGBA1C 7.1 (H) 04/23/2016    Dg Inject Diag/thera/inc Needle/cath/plc Epi/lumb/sac W/img  Result Date: 09/07/2016 CLINICAL DATA:  Lumbosacral spondylosis without myelopathy. Lumbar spinal stenosis with neurogenic claudication. FLUOROSCOPY TIME:  Radiation Exposure Index (as provided by the fluoroscopic device): 21.74 microGray*m^2 Fluoroscopy Time (in minutes and seconds):  7 seconds PROCEDURE: The procedure, risks, benefits, and alternatives were explained to the patient. Questions regarding the procedure were encouraged and answered. The patient understands and consents to the procedure. LUMBAR EPIDURAL INJECTION: An interlaminar approach was performed on the left at L4-5. The overlying skin was cleansed and anesthetized. A 3.5 inch 20 gauge epidural needle was advanced using loss-of-resistance technique. DIAGNOSTIC EPIDURAL INJECTION: Injection of Isovue-M 200 shows a good epidural pattern with spread above and below  the level of needle placement, primarily on the left. No vascular opacification is seen. THERAPEUTIC EPIDURAL INJECTION: 120 mg of Depo-Medrol mixed with 3 mL 1% lidocaine were instilled. The procedure was well-tolerated, and the patient was discharged thirty minutes following the injection in good condition. COMPLICATIONS: None IMPRESSION: Technically successful lumbar interlaminar epidural injection on the left at L4-5. Electronically Signed   By: Logan Bores M.D.   On: 09/07/2016 11:36    Assessment & Plan:   There are no diagnoses linked to this encounter. I am having Edward [name] maintain his CENTRUM SILVER, Vitamin D-3, Vitamin D (Ergocalciferol), olmesartan, folic acid, niacin, triamterene-hydrochlorothiazide, pyridOXINE, vitamin B-12, aspirin EC, latanoprost, brimonidine, nortriptyline, and furosemide. We will continue to administer furosemide.  No orders of the defined types were placed in this encounter.    Follow-up: No Follow-up on file.  Walker Kehr, MD

## 2016-10-25 NOTE — Assessment & Plan Note (Signed)
Cane Dr Saintclair Halsted - Laminectomy this week

## 2016-10-25 NOTE — Assessment & Plan Note (Addendum)
Dr Saintclair Halsted - surgery is pending Pre-op labs and EKG tomorrow

## 2016-10-25 NOTE — Pre-Procedure Instructions (Signed)
Edward [name]  10/25/2016      [facility] [place], [place] - [address] [place] [address] Phone: [phone] Fax: [phone]    Your procedure is scheduled on Thurs, Nov 9 @ 7:30 AM  Report to [facility] Admitting at 5:30 AM  Call this number if you have problems the morning of surgery:  [phone]   Remember:  Do not eat food or drink liquids after midnight.  Take these medicines the morning of surgery with A SIP OF WATER Eye Drops             Stop taking your Aspirin along with any Vitamins or Herbal Medications. No Goody's,BC's,Aleve,Advil,Motrin,Ibuprofen,or Fish Oil.    Do not wear jewelry, make-up or nail polish.  Do not wear lotions, powders,colognes, or deoderant.  Men may shave face and neck.  Do not bring valuables to the hospital.  [facility] is not responsible for any belongings or valuables.  Contacts, dentures or bridgework may not be worn into surgery.  Leave your suitcase in the car.  After surgery it may be brought to your room.  For patients admitted to the hospital, discharge time will be determined by your treatment team.  Patients discharged the day of surgery will not be allowed to drive home.    [facility]  Before surgery, you can play an important role.  Because skin is not sterile, your skin needs to be as free of germs as possible.  You can reduce the number of germs on you skin by washing with CHG (chlorahexidine gluconate) soap before surgery.  CHG is an antiseptic cleaner which kills germs and bonds with the skin to continue killing germs even after washing.  Please DO NOT use if you have an allergy to CHG or antibacterial soaps.  If your skin becomes reddened/irritated stop using the CHG and inform your nurse when you arrive at Short Stay.  Do not shave (including legs and underarms) for at least 48 hours prior to the first CHG shower.  You may  shave your face.  Please follow these instructions carefully:   1.  Shower with CHG Soap the night before surgery and the                                morning of Surgery.  2.  If you choose to wash your hair, wash your hair first as usual with your       normal shampoo.  3.  After you shampoo, rinse your hair and body thoroughly to remove the                      Shampoo.  4.  Use CHG as you would any other liquid soap.  You can apply chg directly       to the skin and wash gently with scrungie or a clean washcloth.  5.  Apply the CHG Soap to your body ONLY FROM THE NECK DOWN.        Do not use on open wounds or open sores.  Avoid contact with your eyes,       ears, mouth and genitals (private parts).  Wash genitals (private parts)       with your normal soap.  6.  Wash thoroughly, paying special attention to the area where your surgery  will be performed.  7.  Thoroughly rinse your body with warm water from the neck down.  8.  DO NOT shower/wash with your normal soap after using and rinsing off       the CHG Soap.  9.  Pat yourself dry with a clean towel.            10.  Wear clean pajamas.            11.  Place clean sheets on your bed the night of your first shower and do not        sleep with pets.  Day of Surgery  Do not apply any lotions/deoderants the morning of surgery.  Please wear clean clothes to the hospital/surgery center.     Please read over the following fact sheets that you were given. Pain Booklet, Coughing and Deep Breathing, MRSA Information and Surgical Site Infection Prevention

## 2016-10-25 NOTE — Assessment & Plan Note (Signed)
Benicar, Dyazide

## 2016-10-26 ENCOUNTER — Encounter (HOSPITAL_COMMUNITY): Payer: Self-pay

## 2016-10-26 ENCOUNTER — Encounter (HOSPITAL_COMMUNITY)
Admission: RE | Admit: 2016-10-26 | Discharge: 2016-10-26 | Disposition: A | Discharge status: Home / Self Care | Payer: Medicare Other | Source: Ambulatory Visit | Attending: Neurosurgery | Admitting: Neurosurgery

## 2016-10-26 ENCOUNTER — Other Ambulatory Visit: Payer: Self-pay

## 2016-10-26 DIAGNOSIS — Z818 Family history of other mental and behavioral disorders: Secondary | ICD-10-CM | POA: Diagnosis not present

## 2016-10-26 DIAGNOSIS — N189 Chronic kidney disease, unspecified: Secondary | ICD-10-CM | POA: Diagnosis not present

## 2016-10-26 DIAGNOSIS — Z8249 Family history of ischemic heart disease and other diseases of the circulatory system: Secondary | ICD-10-CM | POA: Diagnosis not present

## 2016-10-26 DIAGNOSIS — Z8546 Personal history of malignant neoplasm of prostate: Secondary | ICD-10-CM | POA: Diagnosis not present

## 2016-10-26 DIAGNOSIS — E785 Hyperlipidemia, unspecified: Secondary | ICD-10-CM | POA: Diagnosis not present

## 2016-10-26 DIAGNOSIS — I129 Hypertensive chronic kidney disease with stage 1 through stage 4 chronic kidney disease, or unspecified chronic kidney disease: Secondary | ICD-10-CM | POA: Diagnosis not present

## 2016-10-26 DIAGNOSIS — Z923 Personal history of irradiation: Secondary | ICD-10-CM | POA: Diagnosis not present

## 2016-10-26 DIAGNOSIS — M5416 Radiculopathy, lumbar region: Secondary | ICD-10-CM | POA: Diagnosis not present

## 2016-10-26 DIAGNOSIS — Z8042 Family history of malignant neoplasm of prostate: Secondary | ICD-10-CM | POA: Diagnosis not present

## 2016-10-26 DIAGNOSIS — H9190 Unspecified hearing loss, unspecified ear: Secondary | ICD-10-CM | POA: Diagnosis not present

## 2016-10-26 DIAGNOSIS — M48061 Spinal stenosis, lumbar region without neurogenic claudication: Secondary | ICD-10-CM | POA: Diagnosis not present

## 2016-10-26 DIAGNOSIS — Z836 Family history of other diseases of the respiratory system: Secondary | ICD-10-CM | POA: Diagnosis not present

## 2016-10-26 LAB — CBC
HEMATOCRIT: 39.5 % (ref 39.0–52.0)
HEMOGLOBIN: 13.4 g/dL (ref 13.0–17.0)
MCH: 31.8 pg (ref 26.0–34.0)
MCHC: 33.9 g/dL (ref 30.0–36.0)
MCV: 93.6 fL (ref 78.0–100.0)
Platelets: 186 10*3/uL (ref 150–400)
RBC: 4.22 MIL/uL (ref 4.22–5.81)
RDW: 12.7 % (ref 11.5–15.5)
WBC: 6.1 10*3/uL (ref 4.0–10.5)

## 2016-10-26 LAB — BASIC METABOLIC PANEL
ANION GAP: 9 (ref 5–15)
BUN: 30 mg/dL — AB (ref 6–20)
CHLORIDE: 104 mmol/L (ref 101–111)
CO2: 27 mmol/L (ref 22–32)
Calcium: 9.6 mg/dL (ref 8.9–10.3)
Creatinine, Ser: 1.76 mg/dL — ABNORMAL HIGH (ref 0.61–1.24)
GFR calc Af Amer: 40 mL/min — ABNORMAL LOW (ref >60)
GFR calc non Af Amer: 35 mL/min — ABNORMAL LOW (ref >60)
GLUCOSE: 141 mg/dL — AB (ref 65–99)
POTASSIUM: 4.9 mmol/L (ref 3.5–5.1)
Sodium: 140 mmol/L (ref 135–145)

## 2016-10-26 LAB — SURGICAL PCR SCREEN
MRSA, PCR: POSITIVE — AB
STAPHYLOCOCCUS AUREUS: POSITIVE — AB

## 2016-10-27 NOTE — Progress Notes (Signed)
Anesthesia Chart Review: Patient is a 82 year old male scheduled for L3-4, L4-5 left laminectomy and foraminotomy on 10/28/2016 by Dr. Saintclair Halsted.  History includes nonsmoker, hypertension, hyperlipidemia, venous insufficiency, prostate cancer s/p cryoablation '13, complex right renal cyst, chronic renal insufficiency, NSAID-induced thrombocytopenia, diet controlled DM2, crush injury (back).  - PCP is Dr. Lew Dawes, last visit 10/25/16. He is aware of surgery plans. - Nephrologist is Dr. Justin Mend. - Urologist is Dr. Risa Grill.  Meds include aspirin 81 mg, Alphagan and Xalatan ophthalmic, folic acid, Lasix, niacin, nortriptyline, Benicar, Dyazide.  BP 121/75   Pulse 84   Temp 36.6 C   Resp 20   Ht 6\' 1"  (1.854 m)   Wt 206 lb 6.4 oz (93.6 kg)   SpO2 99%   BMI 27.23 kg/m   10/26/16 EKG: SR with first degree AV block, low voltage QRS, increased R/S ratio in V1, consider early transition or posterior infarct. Interpreting cardiologist did not feel tracing was significantly changed from prior on 10/30/13.   Preoperative labs noted. BUN 30, Cr 1.76 which appear stable when compared to previous labs dating back to 09/2014. CBC WNL. Glucose 141. A1c was 7.1 on 04/23/16.   EKG and labs appear stable. If no acute changes then I anticipate that he can proceed as planned.  Edward [name] [facility] Short Stay Center/Anesthesiology Phone [phone] 10/27/2016 9:12 AM

## 2016-10-28 ENCOUNTER — Encounter (HOSPITAL_COMMUNITY): Payer: Self-pay | Admitting: *Deleted

## 2016-10-28 ENCOUNTER — Inpatient Hospital Stay (HOSPITAL_COMMUNITY): Payer: Medicare Other

## 2016-10-28 ENCOUNTER — Inpatient Hospital Stay (HOSPITAL_COMMUNITY): Payer: Medicare Other | Admitting: Certified Registered Nurse Anesthetist

## 2016-10-28 ENCOUNTER — Encounter (HOSPITAL_COMMUNITY)
Admission: RE | Disposition: A | Discharge status: Home / Self Care | Payer: Self-pay | Source: Ambulatory Visit | Attending: Neurosurgery

## 2016-10-28 ENCOUNTER — Inpatient Hospital Stay (HOSPITAL_COMMUNITY)
Admission: RE | Admit: 2016-10-28 | Discharge: 2016-10-29 | DRG: 517 | Disposition: A | Discharge status: Home / Self Care | Payer: Medicare Other | Source: Ambulatory Visit | Attending: Neurosurgery | Admitting: Neurosurgery

## 2016-10-28 ENCOUNTER — Inpatient Hospital Stay (HOSPITAL_COMMUNITY): Payer: Medicare Other | Admitting: Vascular Surgery

## 2016-10-28 DIAGNOSIS — R402252 Coma scale, best verbal response, oriented, at arrival to emergency department: Secondary | ICD-10-CM | POA: Diagnosis present

## 2016-10-28 DIAGNOSIS — R402142 Coma scale, eyes open, spontaneous, at arrival to emergency department: Secondary | ICD-10-CM | POA: Diagnosis present

## 2016-10-28 DIAGNOSIS — Z836 Family history of other diseases of the respiratory system: Secondary | ICD-10-CM

## 2016-10-28 DIAGNOSIS — M79604 Pain in right leg: Secondary | ICD-10-CM

## 2016-10-28 DIAGNOSIS — M5416 Radiculopathy, lumbar region: Secondary | ICD-10-CM | POA: Diagnosis present

## 2016-10-28 DIAGNOSIS — Z8249 Family history of ischemic heart disease and other diseases of the circulatory system: Secondary | ICD-10-CM | POA: Diagnosis not present

## 2016-10-28 DIAGNOSIS — M4316 Spondylolisthesis, lumbar region: Secondary | ICD-10-CM | POA: Diagnosis not present

## 2016-10-28 DIAGNOSIS — Z8042 Family history of malignant neoplasm of prostate: Secondary | ICD-10-CM

## 2016-10-28 DIAGNOSIS — Z818 Family history of other mental and behavioral disorders: Secondary | ICD-10-CM | POA: Diagnosis not present

## 2016-10-28 DIAGNOSIS — R262 Difficulty in walking, not elsewhere classified: Secondary | ICD-10-CM

## 2016-10-28 DIAGNOSIS — M48061 Spinal stenosis, lumbar region without neurogenic claudication: Secondary | ICD-10-CM | POA: Diagnosis present

## 2016-10-28 DIAGNOSIS — Z7982 Long term (current) use of aspirin: Secondary | ICD-10-CM

## 2016-10-28 DIAGNOSIS — E785 Hyperlipidemia, unspecified: Secondary | ICD-10-CM | POA: Diagnosis present

## 2016-10-28 DIAGNOSIS — M79605 Pain in left leg: Secondary | ICD-10-CM

## 2016-10-28 DIAGNOSIS — N189 Chronic kidney disease, unspecified: Secondary | ICD-10-CM | POA: Diagnosis present

## 2016-10-28 DIAGNOSIS — Z923 Personal history of irradiation: Secondary | ICD-10-CM | POA: Diagnosis not present

## 2016-10-28 DIAGNOSIS — R402362 Coma scale, best motor response, obeys commands, at arrival to emergency department: Secondary | ICD-10-CM | POA: Diagnosis present

## 2016-10-28 DIAGNOSIS — M549 Dorsalgia, unspecified: Secondary | ICD-10-CM | POA: Diagnosis not present

## 2016-10-28 DIAGNOSIS — M48062 Spinal stenosis, lumbar region with neurogenic claudication: Secondary | ICD-10-CM | POA: Diagnosis not present

## 2016-10-28 DIAGNOSIS — Z8546 Personal history of malignant neoplasm of prostate: Secondary | ICD-10-CM | POA: Diagnosis not present

## 2016-10-28 DIAGNOSIS — I129 Hypertensive chronic kidney disease with stage 1 through stage 4 chronic kidney disease, or unspecified chronic kidney disease: Secondary | ICD-10-CM | POA: Diagnosis present

## 2016-10-28 DIAGNOSIS — H9190 Unspecified hearing loss, unspecified ear: Secondary | ICD-10-CM | POA: Diagnosis present

## 2016-10-28 HISTORY — PX: LUMBAR LAMINECTOMY/DECOMPRESSION MICRODISCECTOMY: SHX5026

## 2016-10-28 SURGERY — LUMBAR LAMINECTOMY/DECOMPRESSION MICRODISCECTOMY 2 LEVELS
Anesthesia: General | Site: Back | Laterality: Left

## 2016-10-28 MED ORDER — CHLORHEXIDINE GLUCONATE CLOTH 2 % EX PADS: 6.0000 | MEDICATED_PAD | Freq: Once | CUTANEOUS | Status: DC

## 2016-10-28 MED ORDER — CEFAZOLIN SODIUM-DEXTROSE 2-4 GM/100ML-% IV SOLN
INTRAVENOUS | Status: AC
  Filled 2016-10-28: qty 100

## 2016-10-28 MED ORDER — HYDROMORPHONE HCL 1 MG/ML IJ SOLN
0.2500 mg | INTRAMUSCULAR | Status: DC | PRN
  Administered 2016-10-28: 0.5 mg via INTRAVENOUS

## 2016-10-28 MED ORDER — HYDROMORPHONE HCL 1 MG/ML IJ SOLN: 0.5000 mg | INTRAMUSCULAR | Status: DC | PRN

## 2016-10-28 MED ORDER — NIACIN ER (ANTIHYPERLIPIDEMIC) 500 MG PO TBCR
500.0000 mg | EXTENDED_RELEASE_TABLET | Freq: Every day | ORAL | Status: DC
  Filled 2016-10-28: qty 1

## 2016-10-28 MED ORDER — ADULT MULTIVITAMIN W/MINERALS CH: 1.0000 | ORAL_TABLET | Freq: Every day | ORAL | Status: DC

## 2016-10-28 MED ORDER — ROCURONIUM BROMIDE 100 MG/10ML IV SOLN
INTRAVENOUS | Status: DC | PRN
  Administered 2016-10-28: 50 mg via INTRAVENOUS

## 2016-10-28 MED ORDER — MENTHOL 3 MG MT LOZG: 1.0000 | LOZENGE | OROMUCOSAL | Status: DC | PRN

## 2016-10-28 MED ORDER — BRIMONIDINE TARTRATE 0.15 % OP SOLN
1.0000 [drp] | Freq: Two times a day (BID) | OPHTHALMIC | Status: DC
  Administered 2016-10-28: 1 [drp] via OPHTHALMIC
  Filled 2016-10-28: qty 5

## 2016-10-28 MED ORDER — PROPOFOL 10 MG/ML IV BOLUS
INTRAVENOUS | Status: DC | PRN
  Administered 2016-10-28: 150 mg via INTRAVENOUS
  Administered 2016-10-28: 40 mg via INTRAVENOUS

## 2016-10-28 MED ORDER — LATANOPROST 0.005 % OP SOLN
1.0000 [drp] | Freq: Every day | OPHTHALMIC | Status: DC
  Administered 2016-10-28: 1 [drp] via OPHTHALMIC
  Filled 2016-10-28: qty 2.5

## 2016-10-28 MED ORDER — NORTRIPTYLINE HCL 10 MG PO CAPS
10.0000 mg | ORAL_CAPSULE | Freq: Every day | ORAL | Status: DC
  Administered 2016-10-28: 10 mg via ORAL
  Filled 2016-10-28: qty 1

## 2016-10-28 MED ORDER — ACETAMINOPHEN 325 MG PO TABS: 650.0000 mg | ORAL_TABLET | ORAL | Status: DC | PRN

## 2016-10-28 MED ORDER — BUPIVACAINE HCL (PF) 0.25 % IJ SOLN
INTRAMUSCULAR | Status: AC
  Filled 2016-10-28: qty 30

## 2016-10-28 MED ORDER — ONDANSETRON HCL 4 MG/2ML IJ SOLN
INTRAMUSCULAR | Status: DC | PRN
  Administered 2016-10-28: 4 mg via INTRAVENOUS

## 2016-10-28 MED ORDER — CYCLOBENZAPRINE HCL 10 MG PO TABS: 10.0000 mg | ORAL_TABLET | Freq: Three times a day (TID) | ORAL | Status: DC | PRN

## 2016-10-28 MED ORDER — VITAMIN D 1000 UNITS PO TABS
2000.0000 [IU] | ORAL_TABLET | Freq: Every morning | ORAL | Status: DC
  Filled 2016-10-28: qty 2

## 2016-10-28 MED ORDER — 0.9 % SODIUM CHLORIDE (POUR BTL) OPTIME
TOPICAL | Status: DC | PRN
  Administered 2016-10-28: 1000 mL

## 2016-10-28 MED ORDER — PROPOFOL 10 MG/ML IV BOLUS
INTRAVENOUS | Status: AC
  Filled 2016-10-28: qty 20

## 2016-10-28 MED ORDER — LACTATED RINGERS IV SOLN
INTRAVENOUS | Status: DC | PRN
  Administered 2016-10-28 (×2): via INTRAVENOUS

## 2016-10-28 MED ORDER — ACETAMINOPHEN 650 MG RE SUPP: 650.0000 mg | RECTAL | Status: DC | PRN

## 2016-10-28 MED ORDER — LIDOCAINE-EPINEPHRINE 1 %-1:100000 IJ SOLN
INTRAMUSCULAR | Status: DC | PRN
  Administered 2016-10-28: 4 mL via INTRADERMAL

## 2016-10-28 MED ORDER — ONDANSETRON HCL 4 MG/2ML IJ SOLN
4.0000 mg | INTRAMUSCULAR | Status: DC | PRN
Start: 2016-10-28 — End: 2016-10-29

## 2016-10-28 MED ORDER — THROMBIN 5000 UNITS EX SOLR
CUTANEOUS | Status: DC | PRN
  Administered 2016-10-28: 10000 [IU] via TOPICAL

## 2016-10-28 MED ORDER — HYDROMORPHONE HCL 2 MG/ML IJ SOLN
INTRAMUSCULAR | Status: AC
  Filled 2016-10-28: qty 1

## 2016-10-28 MED ORDER — TRIAMTERENE-HCTZ 37.5-25 MG PO CAPS
1.0000 | ORAL_CAPSULE | Freq: Every morning | ORAL | Status: DC
  Filled 2016-10-28: qty 1

## 2016-10-28 MED ORDER — IRBESARTAN 75 MG PO TABS
37.5000 mg | ORAL_TABLET | Freq: Every day | ORAL | Status: DC
  Filled 2016-10-28: qty 0.5

## 2016-10-28 MED ORDER — PHENYLEPHRINE HCL 10 MG/ML IJ SOLN
INTRAVENOUS | Status: DC | PRN
  Administered 2016-10-28: 20 ug/min via INTRAVENOUS

## 2016-10-28 MED ORDER — OXYCODONE-ACETAMINOPHEN 5-325 MG PO TABS
1.0000 | ORAL_TABLET | ORAL | Status: DC | PRN
  Administered 2016-10-28 – 2016-10-29 (×3): 1 via ORAL
  Filled 2016-10-28 (×3): qty 1

## 2016-10-28 MED ORDER — SODIUM CHLORIDE 0.9 % IR SOLN
Status: DC | PRN
  Administered 2016-10-28: 08:00:00

## 2016-10-28 MED ORDER — DEXAMETHASONE SODIUM PHOSPHATE 10 MG/ML IJ SOLN
INTRAMUSCULAR | Status: AC
  Filled 2016-10-28: qty 1

## 2016-10-28 MED ORDER — BUPIVACAINE HCL (PF) 0.25 % IJ SOLN
INTRAMUSCULAR | Status: DC | PRN
  Administered 2016-10-28: 4 mL
  Administered 2016-10-28: 10 mL

## 2016-10-28 MED ORDER — VITAMIN B-6 100 MG PO TABS
200.0000 mg | ORAL_TABLET | Freq: Every day | ORAL | Status: DC
  Filled 2016-10-28: qty 2

## 2016-10-28 MED ORDER — SODIUM CHLORIDE 0.9% FLUSH: 3.0000 mL | INTRAVENOUS | Status: DC | PRN

## 2016-10-28 MED ORDER — LIDOCAINE HCL (CARDIAC) 20 MG/ML IV SOLN
INTRAVENOUS | Status: DC | PRN
  Administered 2016-10-28: 80 mg via INTRAVENOUS

## 2016-10-28 MED ORDER — CEFAZOLIN SODIUM-DEXTROSE 2-4 GM/100ML-% IV SOLN
2.0000 g | Freq: Three times a day (TID) | INTRAVENOUS | Status: DC
  Administered 2016-10-28 (×2): 2 g via INTRAVENOUS
  Filled 2016-10-28 (×2): qty 100

## 2016-10-28 MED ORDER — EPHEDRINE SULFATE 50 MG/ML IJ SOLN
INTRAMUSCULAR | Status: DC | PRN
  Administered 2016-10-28: 10 mg via INTRAVENOUS
  Administered 2016-10-28: 5 mg via INTRAVENOUS

## 2016-10-28 MED ORDER — FENTANYL CITRATE (PF) 100 MCG/2ML IJ SOLN
INTRAMUSCULAR | Status: DC | PRN
  Administered 2016-10-28: 50 ug via INTRAVENOUS
  Administered 2016-10-28: 150 ug via INTRAVENOUS

## 2016-10-28 MED ORDER — FOLIC ACID 1 MG PO TABS: 1.0000 mg | ORAL_TABLET | Freq: Every day | ORAL | Status: DC

## 2016-10-28 MED ORDER — SODIUM CHLORIDE 0.9% FLUSH
3.0000 mL | Freq: Two times a day (BID) | INTRAVENOUS | Status: DC
  Administered 2016-10-28: 3 mL via INTRAVENOUS

## 2016-10-28 MED ORDER — VITAMIN B-12 1000 MCG PO TABS
1000.0000 ug | ORAL_TABLET | Freq: Every day | ORAL | Status: DC
  Administered 2016-10-28: 1000 ug via ORAL
  Filled 2016-10-28 (×2): qty 1

## 2016-10-28 MED ORDER — VITAMIN D (ERGOCALCIFEROL) 1.25 MG (50000 UNIT) PO CAPS: 50000.0000 [IU] | ORAL_CAPSULE | ORAL | Status: DC

## 2016-10-28 MED ORDER — SUGAMMADEX SODIUM 200 MG/2ML IV SOLN
INTRAVENOUS | Status: DC | PRN
  Administered 2016-10-28: 200 mg via INTRAVENOUS

## 2016-10-28 MED ORDER — PHENOL 1.4 % MT LIQD: 1.0000 | OROMUCOSAL | Status: DC | PRN

## 2016-10-28 MED ORDER — THROMBIN 5000 UNITS EX SOLR
CUTANEOUS | Status: AC
  Filled 2016-10-28: qty 10000

## 2016-10-28 MED ORDER — FENTANYL CITRATE (PF) 100 MCG/2ML IJ SOLN
INTRAMUSCULAR | Status: AC
  Filled 2016-10-28: qty 4

## 2016-10-28 MED ORDER — PHENYLEPHRINE HCL 10 MG/ML IJ SOLN
INTRAMUSCULAR | Status: DC | PRN
  Administered 2016-10-28 (×2): 80 ug via INTRAVENOUS
  Administered 2016-10-28: 40 ug via INTRAVENOUS

## 2016-10-28 MED ORDER — CEFAZOLIN SODIUM-DEXTROSE 2-4 GM/100ML-% IV SOLN
2.0000 g | INTRAVENOUS | Status: AC
  Administered 2016-10-28: 2 g via INTRAVENOUS

## 2016-10-28 MED ORDER — HEMOSTATIC AGENTS (NO CHARGE) OPTIME
TOPICAL | Status: DC | PRN
  Administered 2016-10-28: 1 via TOPICAL

## 2016-10-28 MED ORDER — FUROSEMIDE 20 MG PO TABS
20.0000 mg | ORAL_TABLET | Freq: Once | ORAL | Status: AC
Start: 2016-10-28 — End: 2016-10-28
  Administered 2016-10-28: 20 mg via ORAL
  Filled 2016-10-28: qty 1

## 2016-10-28 MED ORDER — CYCLOBENZAPRINE HCL 10 MG PO TABS
ORAL_TABLET | ORAL | Status: AC
  Administered 2016-10-28: 10 mg
  Filled 2016-10-28: qty 1

## 2016-10-28 MED ORDER — DEXAMETHASONE SODIUM PHOSPHATE 10 MG/ML IJ SOLN
10.0000 mg | INTRAMUSCULAR | Status: AC
  Administered 2016-10-28: 10 mg via INTRAVENOUS

## 2016-10-28 MED ORDER — FUROSEMIDE 20 MG PO TABS: 20.0000 mg | ORAL_TABLET | Freq: Every day | ORAL | Status: DC

## 2016-10-28 MED ORDER — ASPIRIN EC 81 MG PO TBEC: 81.0000 mg | DELAYED_RELEASE_TABLET | Freq: Every day | ORAL | Status: DC

## 2016-10-28 MED ORDER — LIDOCAINE-EPINEPHRINE 1 %-1:100000 IJ SOLN
INTRAMUSCULAR | Status: AC
  Filled 2016-10-28: qty 1

## 2016-10-28 SURGICAL SUPPLY — 56 items
BAG DECANTER FOR FLEXI CONT (MISCELLANEOUS) ×3 IMPLANT
BENZOIN TINCTURE PRP APPL 2/3 (GAUZE/BANDAGES/DRESSINGS) ×3 IMPLANT
BLADE CLIPPER SURG (BLADE) ×3 IMPLANT
BLADE SURG 11 STRL SS (BLADE) ×3 IMPLANT
BUR MATCHSTICK NEURO 3.0 LAGG (BURR) ×3 IMPLANT
BUR PRECISION FLUTE 6.0 (BURR) ×3 IMPLANT
CANISTER SUCT 3000ML PPV (MISCELLANEOUS) ×3 IMPLANT
CLOSURE STERI-STRIP 1/2X4 (GAUZE/BANDAGES/DRESSINGS) ×1
CLOSURE WOUND 1/2 X4 (GAUZE/BANDAGES/DRESSINGS) ×1
CLSR STERI-STRIP ANTIMIC 1/2X4 (GAUZE/BANDAGES/DRESSINGS) ×2 IMPLANT
DECANTER SPIKE VIAL GLASS SM (MISCELLANEOUS) IMPLANT
DERMABOND ADVANCED (GAUZE/BANDAGES/DRESSINGS) ×2
DERMABOND ADVANCED .7 DNX12 (GAUZE/BANDAGES/DRESSINGS) ×1 IMPLANT
DRAPE HALF SHEET 40X57 (DRAPES) IMPLANT
DRAPE LAPAROTOMY 100X72X124 (DRAPES) ×3 IMPLANT
DRAPE MICROSCOPE LEICA (MISCELLANEOUS) ×3 IMPLANT
DRAPE POUCH INSTRU U-SHP 10X18 (DRAPES) ×3 IMPLANT
DRAPE SURG 17X23 STRL (DRAPES) ×3 IMPLANT
DRSG OPSITE 4X5.5 SM (GAUZE/BANDAGES/DRESSINGS) ×3 IMPLANT
DRSG OPSITE POSTOP 4X6 (GAUZE/BANDAGES/DRESSINGS) ×3 IMPLANT
DURAPREP 26ML APPLICATOR (WOUND CARE) ×3 IMPLANT
ELECT REM PT RETURN 9FT ADLT (ELECTROSURGICAL) ×3
ELECTRODE REM PT RTRN 9FT ADLT (ELECTROSURGICAL) ×1 IMPLANT
EVACUATOR 1/8 PVC DRAIN (DRAIN) ×3 IMPLANT
GAUZE SPONGE 4X4 12PLY STRL (GAUZE/BANDAGES/DRESSINGS) ×3 IMPLANT
GAUZE SPONGE 4X4 16PLY XRAY LF (GAUZE/BANDAGES/DRESSINGS) ×3 IMPLANT
GLOVE BIO SURGEON STRL SZ8 (GLOVE) ×6 IMPLANT
GLOVE BIOGEL PI IND STRL 6.5 (GLOVE) ×2 IMPLANT
GLOVE BIOGEL PI INDICATOR 6.5 (GLOVE) ×4
GLOVE ECLIPSE 7.5 STRL STRAW (GLOVE) IMPLANT
GLOVE EXAM NITRILE LRG STRL (GLOVE) IMPLANT
GLOVE EXAM NITRILE XL STR (GLOVE) IMPLANT
GLOVE EXAM NITRILE XS STR PU (GLOVE) IMPLANT
GLOVE INDICATOR 8.5 STRL (GLOVE) ×6 IMPLANT
GLOVE SURG SS PI 6.5 STRL IVOR (GLOVE) ×9 IMPLANT
GOWN STRL REUS W/ TWL LRG LVL3 (GOWN DISPOSABLE) ×1 IMPLANT
GOWN STRL REUS W/ TWL XL LVL3 (GOWN DISPOSABLE) ×2 IMPLANT
GOWN STRL REUS W/TWL 2XL LVL3 (GOWN DISPOSABLE) IMPLANT
GOWN STRL REUS W/TWL LRG LVL3 (GOWN DISPOSABLE) ×2
GOWN STRL REUS W/TWL XL LVL3 (GOWN DISPOSABLE) ×4
KIT BASIN OR (CUSTOM PROCEDURE TRAY) ×3 IMPLANT
KIT ROOM TURNOVER OR (KITS) ×3 IMPLANT
NEEDLE HYPO 22GX1.5 SAFETY (NEEDLE) ×3 IMPLANT
NEEDLE SPNL 22GX3.5 QUINCKE BK (NEEDLE) ×3 IMPLANT
NS IRRIG 1000ML POUR BTL (IV SOLUTION) ×3 IMPLANT
PACK LAMINECTOMY NEURO (CUSTOM PROCEDURE TRAY) ×3 IMPLANT
RUBBERBAND STERILE (MISCELLANEOUS) ×6 IMPLANT
SPONGE SURGIFOAM ABS GEL SZ50 (HEMOSTASIS) ×6 IMPLANT
STRIP CLOSURE SKIN 1/2X4 (GAUZE/BANDAGES/DRESSINGS) ×2 IMPLANT
SUT VIC AB 0 CT1 18XCR BRD8 (SUTURE) ×1 IMPLANT
SUT VIC AB 0 CT1 8-18 (SUTURE) ×2
SUT VIC AB 2-0 CT1 18 (SUTURE) ×3 IMPLANT
SUT VICRYL 4-0 PS2 18IN ABS (SUTURE) ×3 IMPLANT
TOWEL OR 17X24 6PK STRL BLUE (TOWEL DISPOSABLE) ×3 IMPLANT
TOWEL OR 17X26 10 PK STRL BLUE (TOWEL DISPOSABLE) ×3 IMPLANT
WATER STERILE IRR 1000ML POUR (IV SOLUTION) ×3 IMPLANT

## 2016-10-28 NOTE — Addendum Note (Signed)
Addendum  created 10/28/16 1333 by Oletta Lamas, CRNA   Anesthesia Intra Flowsheets edited

## 2016-10-28 NOTE — Op Note (Signed)
Preoperative diagnosis: Lumbar spinal stenosis and left L4 and L5 radiculopathies  Postoperative diagnosis: Same  Procedure: Left-sided decompressive lumbar laminotomies L3-4 L4-5 with partial medial facetectomies and foraminotomies of the L4 and L5 nerve roots.  Surgeon: Dominica Severin Kenzee Bassin  Anesthesia: Gen.  EBL: Minimal  History of present illness: Patient is very pleasant 82 year old some is a progress worsening back and left leg pain rating down posterior thigh posterior lateral calf top his foot and big toe. Workup revealed severe spinal stenosis at L3-4 and L4-5 due to patient's takes her treatment imaging findings and progression of clinical syndrome I recommended decompressive laminotomies on the left at those levels. I extensively went over the risks and benefits of the operation the patient as well as perioperative course expectations of outcome and alternatives of surgery and he understood and agreed to proceed forward.  Operative procedure: Patient brought into the or was induced on general anesthesia positioned prone on the Wilson frame his back was prepped and draped in routine sterile fashion preoperative x-ray localize the appropriate level. So after infiltration of 10 mL lidocaine with epi a midline incision was made and Bovie left car was used to calcification subperiosteal dissections care lamina of L3-L4 and L5. Interoperative x-ray confirmed the location of the appropriate level. High-speed drill was used to drill down the medial aspect of the facet complex in for aspect of the L3 lamina the entire L4 lamina and super aspect of L5 lamina. Laminotomy was begun at L3-4 ligament was identified and removed in piecemeal fashion I also initiated a laminotomy at L4-5 and then working further at L4-5 under microscopic illumination densely hypertrophied ligament flavum was removed exposing the thecal sac aggressive under biting of the medial facet removed a very large spur causing severe compression  of thecal sac and proximal left L5 nerve root. I saw evidence of the previous epidural injection further under biting of the endplates and medial facet complex decompress lateral canal marching up to the 34 disc I saw another very large spur causing severe hourglass compression this was also removed in piecemeal fashion. At the decompression was no further stenosis was easily able to pass a nerve hook and coronary dilator out the foramina at L4 and L5. The wounds and copious irrigated 16 space was maintained Gelfoam was opened up the dura the muscle fascia proximal in layers with Vicryl after placement of a medium Hemovac drain skin was closed running 4 subcuticular Dermabond benzo and Steri-Strips and a sterile dressing was applied patient recovered in stable condition. At the end the case all needle counts and sponge counts were correct.

## 2016-10-28 NOTE — Transfer of Care (Signed)
Immediate Anesthesia Transfer of Care Note  Patient: Edward [name]  Procedure(s) Performed: Procedure(s) with comments: Lumbar Laminectomy/decompression Microdiscectomy lumbar three- lumbar four, lumbar four to lumbar five, Left (Left) - Lumbar Laminectomy/decompression Microdiscectomy lumbar three- lumbar four, lumbar four to lumbar five, Left  Patient Location: PACU  Anesthesia Type:General  Level of Consciousness: awake, alert  and oriented  Airway & Oxygen Therapy: Patient Spontanous Breathing and Patient connected to nasal cannula oxygen  Post-op Assessment: Report given to RN, Post -op Vital signs reviewed and stable and Patient moving all extremities X 4  Post vital signs: Reviewed and stable  Last Vitals:  Vitals:   10/28/16 0606  BP: 130/69  Pulse: 66  Resp: 20  Temp: 36.5 C    Last Pain:  Vitals:   10/28/16 0606  TempSrc: Oral      Patients Stated Pain Goal: 4 ([id number])  Complications: No apparent anesthesia complications

## 2016-10-28 NOTE — Anesthesia Procedure Notes (Addendum)
Procedure Name: Intubation Date/Time: 10/28/2016 7:35 AM Performed by: Oletta Lamas Pre-anesthesia Checklist: Patient identified, Emergency Drugs available, Suction available and Patient being monitored Patient Re-evaluated:Patient Re-evaluated prior to inductionOxygen Delivery Method: Circle System Utilized Preoxygenation: Pre-oxygenation with 100% oxygen Intubation Type: IV induction Ventilation: Mask ventilation without difficulty Laryngoscope Size: Mac and 3 Grade View: Grade II Tube type: Oral Tube size: 7.5 mm Number of attempts: 1 Airway Equipment and Method: Stylet and Oral airway Placement Confirmation: ETT inserted through vocal cords under direct vision,  positive ETCO2 and breath sounds checked- equal and bilateral Secured at: 23 cm Tube secured with: Tape Dental Injury: Teeth and Oropharynx as per pre-operative assessment

## 2016-10-28 NOTE — Progress Notes (Signed)
Lunch relief by MA Wynee Matarazzo RN 

## 2016-10-28 NOTE — H&P (Signed)
Edward [name] is an 82 y.o. male.   Chief Complaint: Back and left leg pain HPI: Patient is very pleasant 82 year old gentleman is a progress worsening back and left leg pain with weakness in his left foot and great toe. Skin progressive worsening with increased difficulty with ambulation and refractory to all forms of conservative treatment. Due to patient's progression of clinical syndrome failure conservative treatment imaging findings and recommended decompressive laminectomy at L3-4 and L4-5. As all his symptoms are left recommend staying on the left preoperatively showed no evidence of instability on flexion extension x-rays. I've gone over the risks and benefits of the procedure as well as perioperative course expectations of outcome and alternatives of surgery and he understands and agrees to proceed forward.  Past Medical History:  Diagnosis Date  . Back pain    Difficulty walking- left leg pain  . Chronic renal insufficiency    DR WEBB  . Complex renal cyst RIGHT--  MONITORED BY DR Risa Grill  . Crush injury, back   . Drug-induced low platelet count    from NSAIDS  . Elevated glucose   . Hearing loss   . History of swelling of feet   . Hyperlipidemia   . Hypertension   . Nocturia   . Prostate cancer ([facility])   . Right knee injury    In past  . S/P radiation therapy 06/25/2015 through 08/19/2015   Prostate 7800 cGy in 40 sessions, seminal vesicles 5600 cGy in 40 sessions   . Venous insufficiency of leg   . Wears dentures    partial  . Wears glasses     Past Surgical History:  Procedure Laterality Date  . COLONOSCOPY  2/77/11  . CRYOABLATION  12/01/2012   Procedure: CRYO ABLATION PROSTATE;  Surgeon: Hanley Ben, MD;  Location: [facility];  Service: Urology;  Laterality: N/A;  . QUADRICEPS TENDON REPAIR Left 10/31/2013   Procedure: Left Quadriceps Tendon Repair;  Surgeon: Marybelle Killings, MD;  Location: [facility];  Service: Orthopedics;   Laterality: Left;  Left Quadriceps Tendon Repair  . REPAIR KNEE LIGAMENT Left 2014    Family History  Problem Relation Age of Onset  . Dementia Mother   . Nephrolithiasis Mother   . Tuberculosis Father   . Hypertension Father   . Nephrolithiasis Brother   . Prostate cancer Brother    Social History:  reports that he has never smoked. He has never used smokeless tobacco. He reports that he does not drink alcohol or use drugs.  Allergies:  Allergies  Allergen Reactions  . Naproxen Sodium Other (See Comments)    low platelets    Facility-Administered Medications Prior to Admission  Medication Dose Route Frequency Provider Last Rate Last Dose  . furosemide (LASIX) tablet 20 mg  20 mg Oral Once Lyndal Pulley, DO       Medications Prior to Admission  Medication Sig Dispense Refill  . brimonidine (ALPHAGAN P) 0.1 % SOLN Place 1 drop into both eyes 2 (two) times daily.    . Cholecalciferol (VITAMIN D-3) 1000 UNITS CAPS Take 2,000 Units by mouth every morning.     . folic acid (FOLVITE) 1 MG tablet Take 1 tablet (1 mg total) by mouth daily. 90 tablet 3  . furosemide (LASIX) 20 MG tablet Take '20mg'$ s by mouth 3 times per week as needed for swelling    . latanoprost (XALATAN) 0.005 % ophthalmic solution Place 1 drop into both eyes at bedtime.    . Multiple  Vitamins-Minerals (CENTRUM SILVER) tablet Take 1 tablet by mouth daily. Reported on 05/25/2016    . niacin (NIASPAN) 500 MG CR tablet Take 1 tablet (500 mg total) by mouth daily. 90 tablet 3  . nortriptyline (PAMELOR) 10 MG capsule Take 1 capsule (10 mg total) by mouth at bedtime. 90 capsule 1  . olmesartan (BENICAR) 40 MG tablet Take 1 tablet (40 mg total) by mouth daily. 90 tablet 3  . pyridOXINE (VITAMIN B-6) 100 MG tablet Take 200 mg by mouth daily.    Marland Kitchen triamterene-hydrochlorothiazide (DYAZIDE) 37.5-25 MG capsule Take 1 each (1 capsule total) by mouth every morning. 90 capsule 3  . vitamin B-12 (CYANOCOBALAMIN) 1000 MCG tablet Take  1,000 mcg by mouth daily.    . Vitamin D, Ergocalciferol, (DRISDOL) 50000 units CAPS capsule Take 1 capsule (50,000 Units total) by mouth every 30 (thirty) days. First of the month 3 capsule 3  . aspirin EC 81 MG tablet Take 81 mg by mouth daily.      Results for orders placed or performed during the hospital encounter of 10/26/16 (from the past 48 hour(s))  Basic metabolic panel     Status: Abnormal   Collection Time: 10/26/16  9:39 AM  Result Value Ref Range   Sodium 140 135 - 145 mmol/L   Potassium 4.9 3.5 - 5.1 mmol/L   Chloride 104 101 - 111 mmol/L   CO2 27 22 - 32 mmol/L   Glucose, Bld 141 (H) 65 - 99 mg/dL   BUN 30 (H) 6 - 20 mg/dL   Creatinine, Ser 1.76 (H) 0.61 - 1.24 mg/dL   Calcium 9.6 8.9 - 10.3 mg/dL   GFR calc non Af Amer 35 (L) >60 mL/min   GFR calc Af Amer 40 (L) >60 mL/min    Comment: (NOTE) The eGFR has been calculated using the CKD EPI equation. This calculation has not been validated in all clinical situations. eGFR's persistently <60 mL/min signify possible Chronic Kidney Disease.    Anion gap 9 5 - 15  CBC     Status: None   Collection Time: 10/26/16  9:39 AM  Result Value Ref Range   WBC 6.1 4.0 - 10.5 K/uL   RBC 4.22 4.22 - 5.81 MIL/uL   Hemoglobin 13.4 13.0 - 17.0 g/dL   HCT 39.5 39.0 - 52.0 %   MCV 93.6 78.0 - 100.0 fL   MCH 31.8 26.0 - 34.0 pg   MCHC 33.9 30.0 - 36.0 g/dL   RDW 12.7 11.5 - 15.5 %   Platelets 186 150 - 400 K/uL  Surgical pcr screen     Status: Abnormal   Collection Time: 10/26/16 10:11 AM  Result Value Ref Range   MRSA, PCR POSITIVE (A) NEGATIVE    Comment: RESULT CALLED TO, READ BACK BY AND VERIFIED WITH: L. Forte RN 12:25 10/26/16 ([email or web address])    Staphylococcus aureus POSITIVE (A) NEGATIVE    Comment:        The Xpert SA Assay (FDA approved for NASAL specimens in patients over 33 years of age), is one component of a comprehensive surveillance program.  Test performance has been validated by [facility] for patients  greater than or equal to 44 year old. It is not intended to diagnose infection nor to guide or monitor treatment.    No results found.  Review of Systems  Musculoskeletal: Positive for back pain, joint pain and myalgias.  All other systems reviewed and are negative.   Blood pressure 130/69, pulse 66, temperature  97.7 F (36.5 C), temperature source Oral, resp. rate 20, height '6\' 1"'$  (1.854 m), weight 93.4 kg (206 lb), SpO2 99 %. Physical Exam  Constitutional: He is oriented to person, place, and time. He appears well-developed.  HENT:  Head: Normocephalic.  Eyes: Pupils are equal, round, and reactive to light.  Neck: Normal range of motion.  Respiratory: Effort normal.  GI: Soft.  Musculoskeletal: Normal range of motion.  Neurological: He is alert and oriented to person, place, and time. He has normal strength. GCS eye subscore is 4. GCS verbal subscore is 5. GCS motor subscore is 6.  Strength is 5 out of 5 iliopsoas, quads, hamstrings, gastric, and tibialis, and EHL on the right on the left leg he has 4-5 weakness in his left EHL otherwise 5 out of 5  Skin: Skin is warm and dry.     Assessment/Plan 82 year old presents for a decompressive laminectomies L4-5 and L3-4.  Malone Admire P, MD 10/28/2016, 7:14 AM

## 2016-10-28 NOTE — Evaluation (Signed)
Physical Therapy Evaluation Patient Details Name: Edward [name] MRN: [id number] DOB: 06-10-1933 Today's Date: 10/28/2016   History of Present Illness  Pt is an 82 y/o male s/o L3-L5 L decompressive laminotomies. PMH including but not limited to HTN and hx of L quadriceps tendon repair in 2014.  Clinical Impression  Pt presented supine in bed with HOB elevated, awake and willing to participate in therapy session. Pt's wife was present throughout session as well. Prior to admission, pt stated that he was mod I with functional mobility, using a SPC to ambulate. Pt required min-mod A to achieve standing from bed and initially with LOB posteriorly that required mod A to maintain upright standing posture. Pt also with decrease in SPO2 in sitting to 84%. PT instructed pt in deep pursed-lip breathing, and after ~2 minutes pt's SPO2 increased to 95%. In standing and during ambulation, pt's SPO2 again decreased to 81%. After ambulation, pt's SPO2 increased to 95% after approximately 30 seconds in sitting EOB. Pt declining stair training this session. Pt would continue to benefit from skilled physical therapy services at this time while admitted to address his below listed limitations in order to improve his overall safety and independence with functional mobility. PT will plan for stair training next session if appropriate.      Follow Up Recommendations Supervision for mobility/OOB    Equipment Recommendations  None recommended by PT    Recommendations for Other Services       Precautions / Restrictions Precautions Precautions: Fall;Back Precaution Booklet Issued: Yes (comment) Precaution Comments: PT reviewed 3/3 precautions with pt throughout session Restrictions Weight Bearing Restrictions: No      Mobility  Bed Mobility Overal bed mobility: Needs Assistance Bed Mobility: Rolling;Sidelying to Sit;Sit to Sidelying Rolling: Supervision Sidelying to sit: Min guard     Sit to sidelying:  Min guard General bed mobility comments: Pt required min guard for safety and VC'ing for sequencing with log roll technique  Transfers Overall transfer level: Needs assistance Equipment used: Rolling walker (2 wheeled) Transfers: Sit to/from Stand Sit to Stand: Mod assist;Min assist         General transfer comment: Pt required mod A to perform STS on first attempt. With bed elevated on second attempt, pt required min A to achieve full standing position.  Ambulation/Gait Ambulation/Gait assistance: Min guard Ambulation Distance (Feet): 150 Feet Assistive device: Rolling walker (2 wheeled) Gait Pattern/deviations: Step-through pattern;Decreased step length - right;Decreased step length - left;Decreased stride length;Trunk flexed;Narrow base of support Gait velocity: decreased Gait velocity interpretation: <1.8 ft/sec, indicative of risk for recurrent falls General Gait Details: During ambulation, pt's SPO2 dropped to 83% on RA but increased quickly to 95% after ambulation with sitting rest break (~30 seconds).  Stairs            Wheelchair Mobility    Modified Rankin (Stroke Patients Only)       Balance Overall balance assessment: Needs assistance Sitting-balance support: Feet supported;No upper extremity supported Sitting balance-Leahy Scale: Fair     Standing balance support: During functional activity;Bilateral upper extremity supported Standing balance-Leahy Scale: Poor Standing balance comment: pt reliant on bilateral UEs on RW. Additionally, upon standing intially pt with LOB posteriorly requiring mod A to maintain upright standing balance. Pt with no LOB during ambulation.                             Pertinent Vitals/Pain Pain Assessment: No/denies pain  Home Living Family/patient expects to be discharged to:: Private residence Living Arrangements: Spouse/significant other Available Help at Discharge: Family;Available 24 hours/day Type of  Home: House Home Access: Stairs to enter Entrance Stairs-Rails: Left Entrance Stairs-Number of Steps: 5 Home Layout: One level Home Equipment: Walker - 2 wheels;Cane - single point      Prior Function Level of Independence: Independent with assistive device(s)         Comments: pt stated that he ambulated with use of SPC prior to admission     Hand Dominance        Extremity/Trunk Assessment   Upper Extremity Assessment: Overall WFL for tasks assessed           Lower Extremity Assessment: Generalized weakness      Cervical / Trunk Assessment: Kyphotic;Other exceptions  Communication   Communication: No difficulties  Cognition Arousal/Alertness: Awake/alert Behavior During Therapy: WFL for tasks assessed/performed Overall Cognitive Status: Within Functional Limits for tasks assessed                      General Comments      Exercises     Assessment/Plan    PT Assessment Patient needs continued PT services  PT Problem List Decreased strength;Decreased activity tolerance;Decreased balance;Decreased mobility;Decreased coordination;Decreased knowledge of use of DME;Decreased safety awareness          PT Treatment Interventions DME instruction;Gait training;Stair training;Functional mobility training;Therapeutic activities;Therapeutic exercise;Balance training;Neuromuscular re-education;Patient/family education    PT Goals (Current goals can be found in the Care Plan section)  Acute Rehab PT Goals Patient Stated Goal: return home tomorrow PT Goal Formulation: With patient/family Time For Goal Achievement: 11/04/16 Potential to Achieve Goals: Good    Frequency Min 5X/week   Barriers to discharge        Co-evaluation               End of Session Equipment Utilized During Treatment: Gait belt Activity Tolerance: Patient tolerated treatment well Patient left: in bed;with call bell/phone within reach;with family/visitor present;with  SCD's reapplied Nurse Communication: Mobility status         Time: 1545-1610 PT Time Calculation (min) (ACUTE ONLY): 25 min   Charges:   PT Evaluation $PT Eval Moderate Complexity: 1 Procedure PT Treatments $Gait Training: 8-22 mins   PT G CodesClearnce Sorrel [name] 10/28/2016, 4:39 PM Sherie Don, [place], DPT [phone]

## 2016-10-28 NOTE — Anesthesia Postprocedure Evaluation (Signed)
Anesthesia Post Note  Patient: Edward [name]  Procedure(s) Performed: Procedure(s) (LRB): Lumbar Laminectomy/decompression Microdiscectomy lumbar three- lumbar four, lumbar four to lumbar five, Left (Left)  Patient location during evaluation: PACU Anesthesia Type: General Level of consciousness: awake Pain management: pain level controlled Vital Signs Assessment: post-procedure vital signs reviewed and stable Respiratory status: spontaneous breathing Cardiovascular status: stable Anesthetic complications: no    Last Vitals:  Vitals:   10/28/16 1030 10/28/16 1035  BP:  120/80  Pulse: 80 80  Resp: 15 14  Temp:      Last Pain:  Vitals:   10/28/16 0950  TempSrc:   PainSc: Asleep                 EDWARDS,Dhalia Zingaro

## 2016-10-28 NOTE — Anesthesia Preprocedure Evaluation (Signed)
Anesthesia Evaluation  Patient identified by MRN, date of birth, ID band Patient awake    Reviewed: Allergy & Precautions, NPO status , Patient's Chart, lab work & pertinent test results  Airway Mallampati: II  TM Distance: >3 FB     Dental   Pulmonary neg pulmonary ROS,    breath sounds clear to auscultation       Cardiovascular hypertension, + Peripheral Vascular Disease   Rhythm:Regular Rate:Normal     Neuro/Psych    GI/Hepatic negative GI ROS, Neg liver ROS,   Endo/Other  diabetes  Renal/GU Renal disease     Musculoskeletal  (+) Arthritis ,   Abdominal   Peds  Hematology   Anesthesia Other Findings   Reproductive/Obstetrics                             Anesthesia Physical Anesthesia Plan  ASA: III  Anesthesia Plan: General   Post-op Pain Management:    Induction: Intravenous  Airway Management Planned: Oral ETT  Additional Equipment:   Intra-op Plan:   Post-operative Plan: Extubation in OR  Informed Consent: I have reviewed the patients History and Physical, chart, labs and discussed the procedure including the risks, benefits and alternatives for the proposed anesthesia with the patient or authorized representative who has indicated his/her understanding and acceptance.   Dental advisory given  Plan Discussed with: CRNA and Anesthesiologist  Anesthesia Plan Comments:         Anesthesia Quick Evaluation

## 2016-10-29 ENCOUNTER — Encounter (HOSPITAL_COMMUNITY): Payer: Self-pay | Admitting: Neurosurgery

## 2016-10-29 MED ORDER — OXYCODONE-ACETAMINOPHEN 5-325 MG PO TABS: 1.0000 | ORAL_TABLET | ORAL | 0 refills | Status: DC | PRN

## 2016-10-29 MED ORDER — MUPIROCIN 2 % EX OINT: 1.0000 "application " | TOPICAL_OINTMENT | Freq: Two times a day (BID) | CUTANEOUS | Status: DC

## 2016-10-29 MED ORDER — CHLORHEXIDINE GLUCONATE CLOTH 2 % EX PADS
6.0000 | MEDICATED_PAD | Freq: Every day | CUTANEOUS | Status: DC
  Administered 2016-10-29: 6 via TOPICAL

## 2016-10-29 NOTE — Progress Notes (Signed)
Patient alert and oriented, mae's well, voiding adequate amount of urine, swallowing without difficulty, c/o mild pain and meds given prior to discharged for ride and discomfort. Patient discharged home with family. Script and discharged instructions given to patient. Patient and family stated understanding of instructions given. 

## 2016-10-29 NOTE — Progress Notes (Signed)
Patient ID: Edward [name], male   DOB: 06-23-1933, 82 y.o.   MRN: [id number] Patient doing well no leg pain  Strength out of 5 wound clean dry and intact  Discharge home

## 2016-10-29 NOTE — Discharge Instructions (Signed)
Wound Care Keep the incision clean and dry remove the outer dressing in 2 days, leave the Steri-Strips intact. Wrap with Saran wrap for showers only Do not put any creams, lotions, or ointments on incision. Leave steri-strips on back.  They will fall off by themselves.  Activity Walk each and every day, increasing distance each day. No lifting greater than 5 lbs.  No lifting no bending no twisting no driving or riding a car unless coming back and forth to see me.  Diet Resume your normal diet.   Return to Work Will be discussed at you follow up appointment.  Call Your Doctor If Any of These Occur Redness, drainage, or swelling at the wound.  Temperature greater than 101 degrees. Severe pain not relieved by pain medication. Incision starts to come apart.  Follow Up Appt Call today for appointment in 1-2 weeks ([phone]) or for problems.  If you have any hardware placed in your spine, you will need an x-ray before your appointment.   

## 2016-10-29 NOTE — Progress Notes (Signed)
Physical Therapy Treatment Patient Details Name: Edward [name] MRN: [id number] DOB: August 13, 1933 Today's Date: 10/29/2016    History of Present Illness Pt is an 82 y/o male s/o L3-L5 L decompressive laminotomies. PMH including but not limited to HTN and hx of L quadriceps tendon repair in 2014.    PT Comments    Pt progressing well with long hall distance and able to state precautions but required cues and education to maintain throughout mobility. Educated for transfers, functional mobility, DME use and stairs. Will continue to follow acutely. Pt reports no numbness or weakness.   Follow Up Recommendations  Supervision for mobility/OOB     Equipment Recommendations       Recommendations for Other Services       Precautions / Restrictions Precautions Precautions: Fall;Back Precaution Comments: PT reviewed 3/3 precautions with pt throughout session    Mobility  Bed Mobility Overal bed mobility: Needs Assistance Bed Mobility: Supine to Sit;Sit to Supine   Sidelying to sit: Min guard     Sit to sidelying: Min assist General bed mobility comments: cues for sequence with assist to bring legs onto surface  Transfers Overall transfer level: Needs assistance   Transfers: Sit to/from Stand Sit to Stand: Min guard         General transfer comment: cues for posture and hand placement  Ambulation/Gait Ambulation/Gait assistance: Min guard Ambulation Distance (Feet): 400 Feet Assistive device: Rolling walker (2 wheeled) Gait Pattern/deviations: Step-through pattern;Decreased stride length;Trunk flexed   Gait velocity interpretation: Below normal speed for age/gender General Gait Details: cues for posture, position in RW, safety. Pt initiated gait with cane but was reaching out for environmental support and performed gait more safely with RW , encouraged continued use   Stairs Stairs: Yes Stairs assistance: Min guard Stair Management: Step to pattern;Alternating  pattern;Forwards;Sideways;One rail Left Number of Stairs: 6 General stair comments: pt with tendency to try to twist with RUE to reach Left rail with ascending forward, multimodal cues for sidestepping to grasp rail with both hands and prevent twisting. pt able to descend forward with rail in alternating pattern  Wheelchair Mobility    Modified Rankin (Stroke Patients Only)       Balance                                    Cognition Arousal/Alertness: Awake/alert Behavior During Therapy: WFL for tasks assessed/performed Overall Cognitive Status: Within Functional Limits for tasks assessed                      Exercises      General Comments        Pertinent Vitals/Pain Pain Assessment: 0-10 Pain Score: 3  Pain Location: incision Pain Descriptors / Indicators: Sore Pain Intervention(s): Limited activity within patient's tolerance;Repositioned    Home Living                      Prior Function            PT Goals (current goals can now be found in the care plan section) Progress towards PT goals: Progressing toward goals    Frequency           PT Plan Current plan remains appropriate    Co-evaluation             End of Session Equipment Utilized During Treatment: Gait belt Activity Tolerance: Patient  tolerated treatment well Patient left: in chair;with call bell/phone within reach     Time: 0825-0842 PT Time Calculation (min) (ACUTE ONLY): 17 min  Charges:  $Gait Training: 8-22 mins                    G Codes:      Melford Aase Nov 07, 2016, 9:14 AM  Elwyn Reach, [place]

## 2016-10-29 NOTE — Discharge Summary (Signed)
  Physician Discharge Summary  Patient ID: Edward [name] MRN: [id number] DOB/AGE: [birth year] 80 y.o.  Admit date: 10/28/2016 Discharge date: 10/29/2016  Admission Diagnoses:Lumbar spinal stenosis L3-4 L4-5  Discharge Diagnoses: Same Active Problems:   Spinal stenosis at L4-L5 level   Discharged Condition: good  Hospital Course: Patient is been Hospital underwent decompressive laminectomies L3-4 L4-5 on the left postoperatively patient did very well recovered in the floor on the floor was angling and voiding spontaneously tolerating regular diet stable for discharge home.  Consults: Significant Diagnostic Studies: Treatments: Decompressive laminectomies L3-4 L4-5 left Discharge Exam: Blood pressure (!) 119/56, pulse 67, temperature 98.7 F (37.1 C), temperature source Oral, resp. rate 18, height 6\' 1"  (1.854 m), weight 93.4 kg (206 lb), SpO2 98 %. Strength out of 5 wound clean dry and intact  Disposition: Home     Medication List    TAKE these medications   aspirin EC 81 MG tablet Take 81 mg by mouth daily.   brimonidine 0.1 % Soln Commonly known as:  ALPHAGAN P Place 1 drop into both eyes 2 (two) times daily.   CENTRUM SILVER tablet Take 1 tablet by mouth daily. Reported on [id number]   folic acid 1 MG tablet Commonly known as:  FOLVITE Take 1 tablet (1 mg total) by mouth daily.   furosemide 20 MG tablet Commonly known as:  LASIX Take 20mg s by mouth 3 times per week as needed for swelling   latanoprost 0.005 % ophthalmic solution Commonly known as:  XALATAN Place 1 drop into both eyes at bedtime.   niacin 500 MG CR tablet Commonly known as:  NIASPAN Take 1 tablet (500 mg total) by mouth daily.   nortriptyline 10 MG capsule Commonly known as:  PAMELOR Take 1 capsule (10 mg total) by mouth at bedtime.   olmesartan 40 MG tablet Commonly known as:  BENICAR Take 1 tablet (40 mg total) by mouth daily.   oxyCODONE-acetaminophen 5-325 MG tablet Commonly  known as:  PERCOCET/ROXICET Take 1-2 tablets by mouth every 4 (four) hours as needed for moderate pain.   pyridOXINE 100 MG tablet Commonly known as:  VITAMIN B-6 Take 200 mg by mouth daily.   triamterene-hydrochlorothiazide 37.5-25 MG capsule Commonly known as:  DYAZIDE Take 1 each (1 capsule total) by mouth every morning.   vitamin B-12 1000 MCG tablet Commonly known as:  CYANOCOBALAMIN Take 1,000 mcg by mouth daily.   Vitamin D (Ergocalciferol) 50000 units Caps capsule Commonly known as:  DRISDOL Take 1 capsule (50,000 Units total) by mouth every 30 (thirty) days. First of the month   Vitamin D-3 1000 units Caps Take 2,000 Units by mouth every morning.      Follow-up Information    Rashawna Scoles P, MD Follow up.   Specialty:  Neurosurgery Contact information: [address] [place] [address] [phone]        Elaina Hoops, MD .   Specialty:  Neurosurgery Contact information: [address] [place] [address] [phone]           Signed: Elaina Hoops 10/29/2016, 7:00 AM

## 2016-12-31 DIAGNOSIS — H2513 Age-related nuclear cataract, bilateral: Secondary | ICD-10-CM | POA: Diagnosis not present

## 2016-12-31 DIAGNOSIS — H401133 Primary open-angle glaucoma, bilateral, severe stage: Secondary | ICD-10-CM | POA: Diagnosis not present

## 2017-01-04 DIAGNOSIS — C61 Malignant neoplasm of prostate: Secondary | ICD-10-CM | POA: Diagnosis not present

## 2017-01-10 DIAGNOSIS — C61 Malignant neoplasm of prostate: Secondary | ICD-10-CM | POA: Diagnosis not present

## 2017-01-10 DIAGNOSIS — R351 Nocturia: Secondary | ICD-10-CM | POA: Diagnosis not present

## 2017-01-10 DIAGNOSIS — N401 Enlarged prostate with lower urinary tract symptoms: Secondary | ICD-10-CM | POA: Diagnosis not present

## 2017-01-17 ENCOUNTER — Encounter: Payer: Self-pay | Admitting: Internal Medicine

## 2017-02-21 ENCOUNTER — Ambulatory Visit: Payer: Medicare Other | Admitting: Internal Medicine

## 2017-02-23 ENCOUNTER — Ambulatory Visit (INDEPENDENT_AMBULATORY_CARE_PROVIDER_SITE_OTHER): Payer: Medicare Other | Admitting: Internal Medicine

## 2017-02-23 ENCOUNTER — Encounter: Payer: Self-pay | Admitting: Internal Medicine

## 2017-02-23 DIAGNOSIS — R609 Edema, unspecified: Secondary | ICD-10-CM | POA: Diagnosis not present

## 2017-02-23 DIAGNOSIS — G8929 Other chronic pain: Secondary | ICD-10-CM | POA: Diagnosis not present

## 2017-02-23 DIAGNOSIS — E119 Type 2 diabetes mellitus without complications: Secondary | ICD-10-CM | POA: Diagnosis not present

## 2017-02-23 DIAGNOSIS — M545 Low back pain: Secondary | ICD-10-CM | POA: Diagnosis not present

## 2017-02-23 DIAGNOSIS — I10 Essential (primary) hypertension: Secondary | ICD-10-CM

## 2017-02-23 MED ORDER — TRAMADOL-ACETAMINOPHEN 37.5-325 MG PO TABS: 0.5000 | ORAL_TABLET | Freq: Three times a day (TID) | ORAL | 2 refills | Status: DC | PRN

## 2017-02-23 MED ORDER — FUROSEMIDE 20 MG PO TABS: ORAL_TABLET | ORAL | 3 refills | Status: DC

## 2017-02-23 NOTE — Assessment & Plan Note (Addendum)
S/p back surgery 11/17

## 2017-02-23 NOTE — Assessment & Plan Note (Signed)
  On diet  

## 2017-02-23 NOTE — Assessment & Plan Note (Signed)
Re-start Lasix prn

## 2017-02-23 NOTE — Progress Notes (Signed)
Subjective:  Patient ID: Edward [name], male    DOB: October 30, 1933  Age: 83 y.o. MRN: [id number]  CC: Follow-up (type 2 DM)   HPI Edward [name] presents for OA, edema - worse off Lasix x 2-3 mo, insomnia f/u  Outpatient Medications Prior to Visit  Medication Sig Dispense Refill  . aspirin EC 81 MG tablet Take 81 mg by mouth daily.    . brimonidine (ALPHAGAN P) 0.1 % SOLN Place 1 drop into both eyes 2 (two) times daily.    . Cholecalciferol (VITAMIN D-3) 1000 UNITS CAPS Take 2,000 Units by mouth every morning.     . folic acid (FOLVITE) 1 MG tablet Take 1 tablet (1 mg total) by mouth daily. 90 tablet 3  . furosemide (LASIX) 20 MG tablet Take 20mg s by mouth 3 times per week as needed for swelling    . latanoprost (XALATAN) 0.005 % ophthalmic solution Place 1 drop into both eyes at bedtime.    . Multiple Vitamins-Minerals (CENTRUM SILVER) tablet Take 1 tablet by mouth daily. Reported on 05/25/2016    . niacin (NIASPAN) 500 MG CR tablet Take 1 tablet (500 mg total) by mouth daily. 90 tablet 3  . olmesartan (BENICAR) 40 MG tablet Take 1 tablet (40 mg total) by mouth daily. 90 tablet 3  . pyridOXINE (VITAMIN B-6) 100 MG tablet Take 200 mg by mouth daily.    Marland Kitchen triamterene-hydrochlorothiazide (DYAZIDE) 37.5-25 MG capsule Take 1 each (1 capsule total) by mouth every morning. 90 capsule 3  . vitamin B-12 (CYANOCOBALAMIN) 1000 MCG tablet Take 1,000 mcg by mouth daily.    . Vitamin D, Ergocalciferol, (DRISDOL) 50000 units CAPS capsule Take 1 capsule (50,000 Units total) by mouth every 30 (thirty) days. First of the month 3 capsule 3  . nortriptyline (PAMELOR) 10 MG capsule Take 1 capsule (10 mg total) by mouth at bedtime. 90 capsule 1  . oxyCODONE-acetaminophen (PERCOCET/ROXICET) 5-325 MG tablet Take 1-2 tablets by mouth every 4 (four) hours as needed for moderate pain. 40 tablet 0   Facility-Administered Medications Prior to Visit  Medication Dose Route Frequency Provider Last Rate Last Dose  .  furosemide (LASIX) tablet 20 mg  20 mg Oral Once Lyndal Pulley, DO        ROS Review of Systems  Constitutional: Positive for unexpected weight change. Negative for appetite change and fatigue.  HENT: Negative for congestion, nosebleeds, sneezing, sore throat and trouble swallowing.   Eyes: Negative for itching and visual disturbance.  Respiratory: Negative for cough.   Cardiovascular: Positive for leg swelling. Negative for chest pain and palpitations.  Gastrointestinal: Negative for abdominal distention, blood in stool, diarrhea and nausea.  Genitourinary: Negative for frequency and hematuria.  Musculoskeletal: Positive for arthralgias. Negative for back pain, gait problem, joint swelling and neck pain.  Skin: Negative for rash.  Neurological: Negative for dizziness, tremors, speech difficulty and weakness.  Psychiatric/Behavioral: Negative for agitation, dysphoric mood and sleep disturbance. The patient is not nervous/anxious.     Objective:  BP 130/80   Pulse 72   Temp 98.1 F (36.7 C) (Oral)   Resp 16   Ht 6\' 1"  (1.854 m)   Wt 213 lb 8 oz (96.8 kg)   SpO2 97%   BMI 28.17 kg/m   BP Readings from Last 3 Encounters:  02/23/17 130/80  10/29/16 (!) 116/55  10/26/16 121/75    Wt Readings from Last 3 Encounters:  02/23/17 213 lb 8 oz (96.8 kg)  10/28/16 206 lb (  93.4 kg)  10/26/16 206 lb 6.4 oz (93.6 kg)    Physical Exam  Constitutional: He is oriented to person, place, and time. He appears well-developed. No distress.  NAD  HENT:  Mouth/Throat: Oropharynx is clear and moist.  Eyes: Conjunctivae are normal. Pupils are equal, round, and reactive to light.  Neck: Normal range of motion. No JVD present. No thyromegaly present.  Cardiovascular: Normal rate, regular rhythm, normal heart sounds and intact distal pulses.  Exam reveals no gallop and no friction rub.   No murmur heard. Pulmonary/Chest: Effort normal and breath sounds normal. No respiratory distress. He has no  wheezes. He has no rales. He exhibits no tenderness.  Abdominal: Soft. Bowel sounds are normal. He exhibits no distension and no mass. There is no tenderness. There is no rebound and no guarding.  Musculoskeletal: Normal range of motion. He exhibits edema and tenderness.  Lymphadenopathy:    He has no cervical adenopathy.  Neurological: He is alert and oriented to person, place, and time. He has normal reflexes. No cranial nerve deficit. He exhibits normal muscle tone. He displays a negative Romberg sign. Coordination abnormal. Gait normal.  Skin: Skin is warm and dry. No rash noted.  Psychiatric: He has a normal mood and affect. His behavior is normal. Judgment and thought content normal.  L knee w/a scar - tender B LE w/edema 1+ or more  Lab Results  Component Value Date   WBC 6.1 10/26/2016   HGB 13.4 10/26/2016   HCT 39.5 10/26/2016   PLT 186 10/26/2016   GLUCOSE 141 (H) 10/26/2016   CHOL 152 09/24/2015   TRIG 151.0 (H) 09/24/2015   HDL 34.10 (L) 09/24/2015   LDLCALC 88 09/24/2015   ALT 23 09/24/2015   AST 19 09/24/2015   NA 140 10/26/2016   K 4.9 10/26/2016   CL 104 10/26/2016   CREATININE 1.76 (H) 10/26/2016   BUN 30 (H) 10/26/2016   CO2 27 10/26/2016   TSH 1.34 09/24/2015   PSA 15.38 (H) 09/29/2012   HGBA1C 7.1 (H) 04/23/2016    No results found.  Assessment & Plan:   There are no diagnoses linked to this encounter. I have discontinued Mr. Farrier's nortriptyline and oxyCODONE-acetaminophen. I am also having him maintain his CENTRUM SILVER, Vitamin D-3, pyridOXINE, vitamin B-12, aspirin EC, latanoprost, brimonidine, furosemide, folic acid, olmesartan, niacin, triamterene-hydrochlorothiazide, and Vitamin D (Ergocalciferol). We will continue to administer furosemide.  No orders of the defined types were placed in this encounter.    Follow-up: No Follow-up on file.  Walker Kehr, MD

## 2017-02-23 NOTE — Progress Notes (Signed)
Pre-visit discussion using our clinic review tool. No additional management support is needed unless otherwise documented below in the visit note.  

## 2017-02-23 NOTE — Assessment & Plan Note (Signed)
Benicar, Dyazide Lasix prn

## 2017-03-14 DIAGNOSIS — M5137 Other intervertebral disc degeneration, lumbosacral region: Secondary | ICD-10-CM | POA: Diagnosis not present

## 2017-03-14 DIAGNOSIS — G8929 Other chronic pain: Secondary | ICD-10-CM | POA: Diagnosis not present

## 2017-03-14 DIAGNOSIS — M25562 Pain in left knee: Secondary | ICD-10-CM | POA: Diagnosis not present

## 2017-04-13 ENCOUNTER — Encounter (INDEPENDENT_AMBULATORY_CARE_PROVIDER_SITE_OTHER): Payer: Self-pay | Admitting: Orthopaedic Surgery

## 2017-04-13 ENCOUNTER — Ambulatory Visit (INDEPENDENT_AMBULATORY_CARE_PROVIDER_SITE_OTHER): Payer: Medicare Other

## 2017-04-13 ENCOUNTER — Ambulatory Visit (INDEPENDENT_AMBULATORY_CARE_PROVIDER_SITE_OTHER): Payer: Medicare Other | Admitting: Orthopaedic Surgery

## 2017-04-13 VITALS — BP 120/69 | HR 69 | Ht 73.0 in | Wt 206.0 lb

## 2017-04-13 DIAGNOSIS — M545 Low back pain: Secondary | ICD-10-CM

## 2017-04-13 DIAGNOSIS — R0601 Orthopnea: Secondary | ICD-10-CM

## 2017-04-13 DIAGNOSIS — M25562 Pain in left knee: Secondary | ICD-10-CM

## 2017-04-13 DIAGNOSIS — G8929 Other chronic pain: Secondary | ICD-10-CM

## 2017-04-13 DIAGNOSIS — R0609 Other forms of dyspnea: Secondary | ICD-10-CM | POA: Diagnosis not present

## 2017-04-13 DIAGNOSIS — R600 Localized edema: Secondary | ICD-10-CM

## 2017-04-13 NOTE — Progress Notes (Signed)
Office Visit Note   Patient: Edward [name]           Date of Birth: [birth year]           MRN: [id number] Visit Date: 04/13/2017              Requested by: Kary Kos, MD [address] [place] [address], [place] [address] PCP: Walker Kehr, MD   Assessment & Plan: Visit Diagnoses:  1. Chronic bilateral low back pain, with sciatica presence unspecified   2. Chronic pain of left knee   3. Bilateral lower extremity edema   4. Dyspnea on exertion   5. Orthopnea     Plan: With patient's increased left greater than right lower from edema I will schedule him for bilateral lower extremity venous Dopplers to rule out DVT. I am more concerned about the left leg with his history and exam. Patient did have preop left leg weakness and I think what he continues to experience may be related to his lumbar spine. Advised patient that this could take some time to get stronger and he also may not give full return of his strength if he has significant compression for a while before surgery. I also think that the significant amount of swelling that he is having in his left lower extremity is also throwing off his balance. Given a prescription for TED hose. He has an upcoming appointment with Dr. Saintclair Halsted. Question whether or not an NCV/EMG would be of any benefit.  I did contact his primary care physician Dr.Plotnikov's office and advised his assistant of patient's current complaints regarding dyspnea and lower extremity edema. Patient has an appointment to see Dr. Ronnald Ramp in his office tomorrow at 2:00.  Follow-Up Instructions: No Follow-up on file.   Orders:  Orders Placed This Encounter  Procedures  . XR Lumbar Spine Complete  . XR Knee 1-2 Views Left   No orders of the defined types were placed in this encounter.     Procedures: No procedures performed   Clinical Data: No additional findings.   Subjective: Chief Complaint  Patient presents with  . Left Knee - Pain  . Left Leg - Pain      HPI Patient comes in today with complaints of left leg pain, weakness and swelling. Patient is status post left quadriceps tendon repair 11/02/2013 by Dr. Rodell Perna. Also status post left-sided decompressive lumbar laminotomies L3-4 and L4-5 with partial medial facetectomies and foraminotomies of the L4 and L5 nerve roots by Dr. Dominica Severin cram November 2017. Patient states that he is continuing to have low back pain and left leg pain with feeling of weakness. States that he's had increased swelling in the left leg with feeling of heaviness. Also has swelling in the right lower extremity but not as bad. He is currently on Lasix for lower extremity edema. He does not wear TED hose as previously advised because they get too tight. He is not really complaining of left knee pain. States that Dr. Saintclair Halsted wanted him to come in and have his leg weakness evaluated by [place] since he's had previous quad tendon repair. Left lower extremity swelling has been somewhat increased since he had his surgery last November. Has not had venous Doppler study. Admits to having exertional dyspnea along with orthopnea that he reports has been getting worse 6 months. I am not sure if this is his baseline. He is due to see his primary care physician Dr. Alain Marion in May or June. Does  not have a cardiologist. No complaints of chest pain. Patient relates with a single-prong cane but wife states that she has tried to encourage him to use his walker.  Patient can follow up with [place] in about 6 weeks for recheck. All questions answered. Review of Systems  Constitutional: Positive for activity change and fatigue.  HENT: Negative.   Respiratory: Positive for shortness of breath. Negative for chest tightness.   Cardiovascular: Positive for leg swelling (Left greater than right). Negative for chest pain.  Gastrointestinal: Negative.   Genitourinary: Negative.   Musculoskeletal: Positive for back pain and gait problem.  Neurological: Positive for  weakness.  Psychiatric/Behavioral: Negative.      Objective: Vital Signs: BP 120/69   Pulse 69   Ht 6\' 1"  (1.854 m)   Wt 206 lb (93.4 kg)   BMI 27.18 kg/m   Physical Exam  Constitutional: He is oriented to person, place, and time. No distress.  HENT:  Head: Normocephalic and atraumatic.  Eyes: EOM are normal. Pupils are equal, round, and reactive to light.  Musculoskeletal:  Negative logroll bilateral hips. Negative straight leg raise. Left knee range of motion about 0-120. Knee nontender. Quadriceps tendon intact. Extensor mechanism intact. He is having left greater than right lower extremity pitting edema and swelling. Left calf is tender.  Neurological: He is alert and oriented to person, place, and time.  Skin: Skin is warm and dry.  Psychiatric: He has a normal mood and affect.    Ortho Exam  Specialty Comments:  No specialty comments available.  Imaging: Xr Knee 1-2 Views Left  Result Date: 04/13/2017 X-ray left knee AP lateral views show some heterotopic ossification in the quadriceps tendon. Tricompartmental degenerative changes but otherwise looks pretty good for patient's age. No acute findings.  Xr Lumbar Spine Complete  Result Date: 04/13/2017 X-rays lumbar spine shows multilevel lumbar spondylosis. Estrace documented he does have L4-5 spinal listhesis. On flexion-extension views this appears unchanged from x-rays that are in the epic system October 2017. No acute findings.    PMFS History: Patient Active Problem List   Diagnosis Date Noted  . Spinal stenosis at L4-L5 level 10/28/2016  . Degenerative arthritis of right knee 08/05/2016  . Spinal stenosis, lumbar region, with neurogenic claudication 03/08/2016  . Arthritis of left lower extremity 09/25/2015  . Gait disorder 09/24/2015  . Edema 04/03/2014  . Rupture quadriceps tendon 11/12/2013    Class: Diagnosis of  . Prostate cancer ([facility]) 09/29/2012  . Colon polyp 09/27/2011  . Well adult exam  09/27/2011  . LOW BACK PAIN 07/16/2008  . ACQUIRED CYST OF KIDNEY 01/18/2008  . Diet-controlled type 2 diabetes mellitus ([facility]) 01/18/2008  . VENOUS INSUFFICIENCY 10/20/2007  . CRF (chronic renal failure) 10/20/2007  . HYPERLIPIDEMIA 09/12/2007  . Essential hypertension 09/12/2007  . HEMATURIA, MICROSCOPIC 09/12/2007   Past Medical History:  Diagnosis Date  . Back pain    Difficulty walking- left leg pain  . Chronic renal insufficiency    DR WEBB  . Complex renal cyst RIGHT--  MONITORED BY DR Risa Grill  . Crush injury, back   . Drug-induced low platelet count    from NSAIDS  . Elevated glucose   . Hearing loss   . History of swelling of feet   . Hyperlipidemia   . Hypertension   . Nocturia   . Prostate cancer ([facility])   . Right knee injury    In past  . S/P radiation therapy 06/25/2015 through 08/19/2015   Prostate 7800 cGy in  40 sessions, seminal vesicles 5600 cGy in 40 sessions   . Venous insufficiency of leg   . Wears dentures    partial  . Wears glasses     Family History  Problem Relation Age of Onset  . Dementia Mother   . Nephrolithiasis Mother   . Tuberculosis Father   . Hypertension Father   . Nephrolithiasis Brother   . Prostate cancer Brother     Past Surgical History:  Procedure Laterality Date  . COLONOSCOPY  2/77/11  . CRYOABLATION  12/01/2012   Procedure: CRYO ABLATION PROSTATE;  Surgeon: Hanley Ben, MD;  Location: [facility];  Service: Urology;  Laterality: N/A;  . LUMBAR LAMINECTOMY/DECOMPRESSION MICRODISCECTOMY Left 10/28/2016   Procedure: Lumbar Laminectomy/decompression Microdiscectomy lumbar three- lumbar four, lumbar four to lumbar five, Left;  Surgeon: Kary Kos, MD;  Location: [facility];  Service: Neurosurgery;  Laterality: Left;  Lumbar Laminectomy/decompression Microdiscectomy lumbar three- lumbar four, lumbar four to lumbar five, Left  . QUADRICEPS TENDON REPAIR Left 10/31/2013   Procedure: Left Quadriceps  Tendon Repair;  Surgeon: Marybelle Killings, MD;  Location: [facility];  Service: Orthopedics;  Laterality: Left;  Left Quadriceps Tendon Repair  . REPAIR KNEE LIGAMENT Left 2014   Social History   Occupational History  . retired     Office manager   Social History Main Topics  . Smoking status: Never Smoker  . Smokeless tobacco: Never Used  . Alcohol use No  . Drug use: No  . Sexual activity: Yes

## 2017-04-14 ENCOUNTER — Ambulatory Visit (HOSPITAL_BASED_OUTPATIENT_CLINIC_OR_DEPARTMENT_OTHER)
Admission: RE | Admit: 2017-04-14 | Discharge: 2017-04-14 | Disposition: A | Discharge status: Home / Self Care | Payer: Medicare Other | Source: Ambulatory Visit | Attending: Surgery | Admitting: Surgery

## 2017-04-14 ENCOUNTER — Encounter (HOSPITAL_COMMUNITY): Payer: Self-pay

## 2017-04-14 ENCOUNTER — Emergency Department (HOSPITAL_COMMUNITY): Payer: Medicare Other

## 2017-04-14 ENCOUNTER — Emergency Department (HOSPITAL_COMMUNITY)
Admission: EM | Admit: 2017-04-14 | Discharge: 2017-04-14 | Disposition: A | Discharge status: Home / Self Care | Payer: Medicare Other | Attending: Emergency Medicine | Admitting: Emergency Medicine

## 2017-04-14 ENCOUNTER — Encounter (INDEPENDENT_AMBULATORY_CARE_PROVIDER_SITE_OTHER): Payer: Self-pay | Admitting: Surgery

## 2017-04-14 ENCOUNTER — Ambulatory Visit: Payer: Medicare Other | Admitting: Internal Medicine

## 2017-04-14 ENCOUNTER — Other Ambulatory Visit: Payer: Self-pay

## 2017-04-14 DIAGNOSIS — I82412 Acute embolism and thrombosis of left femoral vein: Secondary | ICD-10-CM | POA: Insufficient documentation

## 2017-04-14 DIAGNOSIS — Z7982 Long term (current) use of aspirin: Secondary | ICD-10-CM | POA: Diagnosis not present

## 2017-04-14 DIAGNOSIS — I10 Essential (primary) hypertension: Secondary | ICD-10-CM | POA: Insufficient documentation

## 2017-04-14 DIAGNOSIS — R0602 Shortness of breath: Secondary | ICD-10-CM | POA: Diagnosis not present

## 2017-04-14 DIAGNOSIS — Z8546 Personal history of malignant neoplasm of prostate: Secondary | ICD-10-CM | POA: Diagnosis not present

## 2017-04-14 DIAGNOSIS — Z79899 Other long term (current) drug therapy: Secondary | ICD-10-CM | POA: Diagnosis not present

## 2017-04-14 DIAGNOSIS — M7989 Other specified soft tissue disorders: Secondary | ICD-10-CM | POA: Diagnosis present

## 2017-04-14 DIAGNOSIS — R600 Localized edema: Secondary | ICD-10-CM

## 2017-04-14 LAB — BASIC METABOLIC PANEL
Anion gap: 10 (ref 5–15)
BUN: 35 mg/dL — AB (ref 6–20)
CHLORIDE: 107 mmol/L (ref 101–111)
CO2: 25 mmol/L (ref 22–32)
CREATININE: 1.97 mg/dL — AB (ref 0.61–1.24)
Calcium: 9.3 mg/dL (ref 8.9–10.3)
GFR, EST AFRICAN AMERICAN: 35 mL/min — AB (ref >60)
GFR, EST NON AFRICAN AMERICAN: 30 mL/min — AB (ref >60)
Glucose, Bld: 132 mg/dL — ABNORMAL HIGH (ref 65–99)
POTASSIUM: 4.9 mmol/L (ref 3.5–5.1)
SODIUM: 142 mmol/L (ref 135–145)

## 2017-04-14 LAB — CBC WITH DIFFERENTIAL/PLATELET
Basophils Absolute: 0 10*3/uL (ref 0.0–0.1)
Basophils Relative: 0 %
Eosinophils Absolute: 0.1 10*3/uL (ref 0.0–0.7)
Eosinophils Relative: 2 %
HEMATOCRIT: 35.7 % — AB (ref 39.0–52.0)
HEMOGLOBIN: 11.9 g/dL — AB (ref 13.0–17.0)
LYMPHS ABS: 1.3 10*3/uL (ref 0.7–4.0)
Lymphocytes Relative: 26 %
MCH: 31.2 pg (ref 26.0–34.0)
MCHC: 33.3 g/dL (ref 30.0–36.0)
MCV: 93.7 fL (ref 78.0–100.0)
MONO ABS: 0.3 10*3/uL (ref 0.1–1.0)
MONOS PCT: 7 %
NEUTROS ABS: 3.3 10*3/uL (ref 1.7–7.7)
NEUTROS PCT: 65 %
Platelets: 175 10*3/uL (ref 150–400)
RBC: 3.81 MIL/uL — ABNORMAL LOW (ref 4.22–5.81)
RDW: 13.3 % (ref 11.5–15.5)
WBC: 5 10*3/uL (ref 4.0–10.5)

## 2017-04-14 LAB — PROTIME-INR
INR: 0.96
Prothrombin Time: 12.7 seconds (ref 11.4–15.2)

## 2017-04-14 LAB — BRAIN NATRIURETIC PEPTIDE: B NATRIURETIC PEPTIDE 5: 11.6 pg/mL (ref 0.0–100.0)

## 2017-04-14 MED ORDER — APIXABAN 5 MG PO TABS
10.0000 mg | ORAL_TABLET | Freq: Two times a day (BID) | ORAL | Status: DC
  Administered 2017-04-14: 10 mg via ORAL
  Filled 2017-04-14: qty 2

## 2017-04-14 MED ORDER — APIXABAN (ELIQUIS) EDUCATION KIT FOR DVT/PE PATIENTS: PACK | 0 refills | Status: DC

## 2017-04-14 MED ORDER — APIXABAN 5 MG PO TABS: 5.0000 mg | ORAL_TABLET | Freq: Two times a day (BID) | ORAL | Status: DC

## 2017-04-14 MED ORDER — APIXABAN 5 MG PO TABS: ORAL_TABLET | ORAL | 0 refills | Status: DC

## 2017-04-14 NOTE — Discharge Instructions (Addendum)
Information on my medicine - ELIQUIS (apixaban)  This medication education was reviewed with me or my healthcare representative as part of my discharge preparation.    Why was Eliquis prescribed for you? Eliquis was prescribed to treat blood clots that may have been found in the veins of your legs (deep vein thrombosis) or in your lungs (pulmonary embolism) and to reduce the risk of them occurring again.  What do You need to know about Eliquis ? The starting dose is 10 mg (two 5 mg tablets) taken TWICE daily for the FIRST SEVEN (7) DAYS, then on 04/21/17  the dose is reduced to ONE 5 mg tablet taken TWICE daily.  Eliquis may be taken with or without food.   Try to take the dose about the same time in the morning and in the evening. If you have difficulty swallowing the tablet whole please discuss with your pharmacist how to take the medication safely.  Take Eliquis exactly as prescribed and DO NOT stop taking Eliquis without talking to the doctor who prescribed the medication.  Stopping may increase your risk of developing a new blood clot.  Refill your prescription before you run out.  After discharge, you should have regular check-up appointments with your healthcare provider that is prescribing your Eliquis.    What do you do if you miss a dose? If a dose of ELIQUIS is not taken at the scheduled time, take it as soon as possible on the same day and twice-daily administration should be resumed. The dose should not be doubled to make up for a missed dose.  Important Safety Information A possible side effect of Eliquis is bleeding. You should call your healthcare provider right away if you experience any of the following: ? Bleeding from an injury or your nose that does not stop. ? Unusual colored urine (red or dark brown) or unusual colored stools (red or black). ? Unusual bruising for unknown reasons. ? A serious fall or if you hit your head (even if there is no bleeding).  Some  medicines may interact with Eliquis and might increase your risk of bleeding or clotting while on Eliquis. To help avoid this, consult your healthcare provider or pharmacist prior to using any new prescription or non-prescription medications, including herbals, vitamins, non-steroidal anti-inflammatory drugs (NSAIDs) and supplements.  This website has more information on Eliquis (apixaban): http://www.eliquis.com/eliquis/home
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
## 2017-04-14 NOTE — ED Triage Notes (Signed)
Patient had U/S today of left lower extremity and positive for DVT . Has had ongoing swelling and pain to leg. No CP. Alert and oriented, NAD

## 2017-04-14 NOTE — Progress Notes (Signed)
*  Preliminary Results* Bilateral lower extremity venous duplex completed. The right lower extremity is negative for deep vein thrombosis. The left lower extremity is positive for acute vs. age determinate thrombus involving the left saphenofemoral junction, left common femoral vein, left femoral vein, left popliteal vein, and left posterior tibial veins. There is no evidence of Baker's cyst bilaterally.  Preliminary results discussed with Benjiman Core, PA-C.  04/14/2017 11:15 AM Maudry Mayhew, BS, RVT, RDCS, RDMS

## 2017-04-14 NOTE — Progress Notes (Signed)
A little while ago I received a call back report from vascular lab and [facility] regarding Edward [name]. Report for left lower extremity venous Doppler showed fairly extensive DVT throughout the left leg. I did see patient in the office yesterday and he had been complaining of increased left lower extremity swelling with pain along with dyspnea 6 months. I did speak with his primary care physician Dr. Alain Marion by phone and advised him of the results of the Doppler along with patient's symptoms. He requested that I have patient sent over from the hospital to his office. I spoke with my attending Dr. Rodell Perna and advised him of the report and recommendations by the primary care physician. We both agreed that the patient should be taken immediately to the emergency room for evaluation and admission to the hospital. At this point we do not feel that he would be safe for patient to leave the hospital with the extensive blood clot that he has in his leg. I advised Sharyn Lull with the vascular lab to take the patient immediately to the emergency room.

## 2017-04-14 NOTE — ED Notes (Signed)
Patient denies pain and is resting comfortably.  

## 2017-04-14 NOTE — Progress Notes (Signed)
ANTICOAGULATION CONSULT NOTE - Initial Consult  Pharmacy Consult for apixaban Indication: DVT  Allergies  Allergen Reactions  . Naproxen Sodium Other (See Comments)    low platelets    Patient Measurements:   Heparin Dosing Weight:   Vital Signs: Temp: 98.7 F (37.1 C) (04/26 1147) Temp Source: Oral (04/26 1147) BP: 130/63 (04/26 1147) Pulse Rate: 63 (04/26 1147)  Labs: No results for input(s): HGB, HCT, PLT, APTT, LABPROT, INR, HEPARINUNFRC, HEPRLOWMOCWT, CREATININE, CKTOTAL, CKMB, TROPONINI in the last 72 hours.  CrCl cannot be calculated (Patient's most recent lab result is older than the maximum 21 days allowed.).   Medical History: Past Medical History:  Diagnosis Date  . Back pain    Difficulty walking- left leg pain  . Chronic renal insufficiency    DR WEBB  . Complex renal cyst RIGHT--  MONITORED BY DR Risa Grill  . Crush injury, back   . Drug-induced low platelet count    from NSAIDS  . Elevated glucose   . Hearing loss   . History of swelling of feet   . Hyperlipidemia   . Hypertension   . Nocturia   . Prostate cancer ([place])   . Right knee injury    In past  . S/P radiation therapy 06/25/2015 through 08/19/2015   Prostate 7800 cGy in 40 sessions, seminal vesicles 5600 cGy in 40 sessions   . Venous insufficiency of leg   . Wears dentures    partial  . Wears glasses     Medications:   (Not in a hospital admission) Scheduled:   Infusions:    Assessment: 83yo male presents with leg pain, dyspnea and difficulty ambulating. DVT was found on ultrasound. Pharmacy is consulted to dose apixaban for DVT.   Goal of Therapy:  Monitor platelets by anticoagulation protocol: Yes   Plan:  Apixaban 10mg  BID for 7 days followed by 5mg  BID thereafter Educated patient and wife on apixaban Monitor s/sx of bleeding  Andrey Cota. Diona Foley, PharmD, [place] Clinical Pharmacist [phone] 04/14/2017,12:54 PM

## 2017-04-19 DIAGNOSIS — M5137 Other intervertebral disc degeneration, lumbosacral region: Secondary | ICD-10-CM | POA: Diagnosis not present

## 2017-04-27 ENCOUNTER — Encounter: Payer: Self-pay | Admitting: Internal Medicine

## 2017-04-27 ENCOUNTER — Ambulatory Visit: Payer: Medicare Other | Admitting: Internal Medicine

## 2017-04-27 ENCOUNTER — Ambulatory Visit (INDEPENDENT_AMBULATORY_CARE_PROVIDER_SITE_OTHER): Payer: Medicare Other | Admitting: Internal Medicine

## 2017-04-27 DIAGNOSIS — I82409 Acute embolism and thrombosis of unspecified deep veins of unspecified lower extremity: Secondary | ICD-10-CM | POA: Insufficient documentation

## 2017-04-27 DIAGNOSIS — G8929 Other chronic pain: Secondary | ICD-10-CM | POA: Diagnosis not present

## 2017-04-27 DIAGNOSIS — I82402 Acute embolism and thrombosis of unspecified deep veins of left lower extremity: Secondary | ICD-10-CM | POA: Diagnosis not present

## 2017-04-27 DIAGNOSIS — M545 Low back pain: Secondary | ICD-10-CM | POA: Diagnosis not present

## 2017-04-27 DIAGNOSIS — I10 Essential (primary) hypertension: Secondary | ICD-10-CM

## 2017-04-27 DIAGNOSIS — R269 Unspecified abnormalities of gait and mobility: Secondary | ICD-10-CM | POA: Diagnosis not present

## 2017-04-27 MED ORDER — APIXABAN 5 MG PO TABS: 5.0000 mg | ORAL_TABLET | Freq: Two times a day (BID) | ORAL | 1 refills | Status: DC

## 2017-04-27 NOTE — Assessment & Plan Note (Signed)
Tramadol prn 

## 2017-04-27 NOTE — Assessment & Plan Note (Signed)
Cont w/Eliquis Ven Doppler Summary:  - No evidence of deep vein thrombosis involving the right lower   extremity. - Findings consistent with acute versus age indeterminate deep vein   thrombosis involving the left common femoral vein, left femoral   vein, left popliteal vein, and left posterial tibial vein. - No evidence of Baker&'s cyst on the right or left.  Other specific details can be found in the table(s) above. Prepared and Electronically Authenticated by  Adele Barthel, MD 2018-04-26T18:09:18

## 2017-04-27 NOTE — Assessment & Plan Note (Signed)
Benicar and Dyazide

## 2017-04-27 NOTE — Assessment & Plan Note (Signed)
Cane

## 2017-04-27 NOTE — Progress Notes (Signed)
Subjective:  Patient ID: Edward [name], male    DOB: 1933/06/01  Age: 83 y.o. MRN: [id number]  CC: No chief complaint on file.   HPI EREN RYSER presents for LLE DVT f/u F/u HTN, LBP, OA  Outpatient Medications Prior to Visit  Medication Sig Dispense Refill  . apixaban (ELIQUIS STARTER PACK) 5 MG TABS tablet Use as directed on packaging 60 tablet 0  . apixaban (ELIQUIS) KIT Please use these instructions to assist you with your new medication 1 kit 0  . aspirin EC 81 MG tablet Take 81 mg by mouth daily.    . brimonidine (ALPHAGAN P) 0.1 % SOLN Place 1 drop into both eyes 2 (two) times daily.    . Cholecalciferol (VITAMIN D-3) 1000 UNITS CAPS Take 2,000 Units by mouth every morning.     . folic acid (FOLVITE) 1 MG tablet Take 1 tablet (1 mg total) by mouth daily. 90 tablet 3  . furosemide (LASIX) 20 MG tablet Take 20-40 mgs by mouth 3 times per week as needed for leg swelling 90 tablet 3  . latanoprost (XALATAN) 0.005 % ophthalmic solution Place 1 drop into both eyes at bedtime.    . Multiple Vitamins-Minerals (CENTRUM SILVER) tablet Take 1 tablet by mouth daily. Reported on 05/25/2016    . niacin (NIASPAN) 500 MG CR tablet Take 1 tablet (500 mg total) by mouth daily. 90 tablet 3  . olmesartan (BENICAR) 40 MG tablet Take 1 tablet (40 mg total) by mouth daily. 90 tablet 3  . pyridOXINE (VITAMIN B-6) 100 MG tablet Take 200 mg by mouth daily.    . traMADol-acetaminophen (ULTRACET) 37.5-325 MG tablet Take 0.5-1 tablets by mouth every 8 (eight) hours as needed for moderate pain or severe pain. 60 tablet 2  . triamterene-hydrochlorothiazide (DYAZIDE) 37.5-25 MG capsule Take 1 each (1 capsule total) by mouth every morning. 90 capsule 3  . vitamin B-12 (CYANOCOBALAMIN) 1000 MCG tablet Take 1,000 mcg by mouth daily.    . Vitamin D, Ergocalciferol, (DRISDOL) 50000 units CAPS capsule Take 1 capsule (50,000 Units total) by mouth every 30 (thirty) days. First of the month 3 capsule 3    Facility-Administered Medications Prior to Visit  Medication Dose Route Frequency Provider Last Rate Last Dose  . furosemide (LASIX) tablet 20 mg  20 mg Oral Once Lyndal Pulley, DO        ROS Review of Systems  Constitutional: Positive for fatigue. Negative for appetite change and unexpected weight change.  HENT: Negative for congestion, nosebleeds, sneezing, sore throat and trouble swallowing.   Eyes: Negative for itching and visual disturbance.  Respiratory: Negative for cough.   Cardiovascular: Positive for leg swelling. Negative for chest pain and palpitations.  Gastrointestinal: Negative for abdominal distention, blood in stool, diarrhea and nausea.  Genitourinary: Negative for frequency and hematuria.  Musculoskeletal: Positive for arthralgias, back pain and gait problem. Negative for joint swelling and neck pain.  Skin: Negative for rash.  Neurological: Negative for dizziness, tremors, speech difficulty and weakness.  Psychiatric/Behavioral: Negative for agitation, dysphoric mood and sleep disturbance. The patient is not nervous/anxious.     Objective:  BP 126/82 (BP Location: Right Arm, Patient Position: Sitting, Cuff Size: Normal)   Pulse 73   Temp 98.2 F (36.8 C) (Oral)   Ht '6\' 1"'$  (1.854 m)   Wt 205 lb 1.3 oz (93 kg)   SpO2 97%   BMI 27.06 kg/m   BP Readings from Last 3 Encounters:  04/27/17 126/82  04/14/17 115/72  04/13/17 120/69    Wt Readings from Last 3 Encounters:  04/27/17 205 lb 1.3 oz (93 kg)  04/13/17 206 lb (93.4 kg)  02/23/17 213 lb 8 oz (96.8 kg)    Physical Exam  Constitutional: He is oriented to person, place, and time. He appears well-developed. No distress.  NAD  HENT:  Mouth/Throat: Oropharynx is clear and moist.  Eyes: Conjunctivae are normal. Pupils are equal, round, and reactive to light.  Neck: Normal range of motion. No JVD present. No thyromegaly present.  Cardiovascular: Normal rate, regular rhythm, normal heart sounds and  intact distal pulses.  Exam reveals no gallop and no friction rub.   No murmur heard. Pulmonary/Chest: Effort normal and breath sounds normal. No respiratory distress. He has no wheezes. He has no rales. He exhibits no tenderness.  Abdominal: Soft. Bowel sounds are normal. He exhibits no distension and no mass. There is no tenderness. There is no rebound and no guarding.  Musculoskeletal: Normal range of motion. He exhibits edema and tenderness.  Lymphadenopathy:    He has no cervical adenopathy.  Neurological: He is alert and oriented to person, place, and time. He has normal reflexes. No cranial nerve deficit. He exhibits normal muscle tone. He displays a negative Romberg sign. Coordination abnormal. Gait normal.  Skin: Skin is warm and dry. No rash noted.  Psychiatric: He has a normal mood and affect. His behavior is normal. Judgment and thought content normal.  cane Trace edema B  Lab Results  Component Value Date   WBC 5.0 04/14/2017   HGB 11.9 (L) 04/14/2017   HCT 35.7 (L) 04/14/2017   PLT 175 04/14/2017   GLUCOSE 132 (H) 04/14/2017   CHOL 152 09/24/2015   TRIG 151.0 (H) 09/24/2015   HDL 34.10 (L) 09/24/2015   LDLCALC 88 09/24/2015   ALT 23 09/24/2015   AST 19 09/24/2015   NA 142 04/14/2017   K 4.9 04/14/2017   CL 107 04/14/2017   CREATININE 1.97 (H) 04/14/2017   BUN 35 (H) 04/14/2017   CO2 25 04/14/2017   TSH 1.34 09/24/2015   PSA 15.38 (H) 09/29/2012   INR 0.96 04/14/2017   HGBA1C 7.1 (H) 04/23/2016    Dg Chest Port 1 View  Result Date: 04/14/2017 CLINICAL DATA:  Shortness of breath today, dyspnea, hypertension, hyperlipidemia, prostate cancer post radiation therapy EXAM: PORTABLE CHEST 1 VIEW COMPARISON:  Portable exam 1249 hours compared to 10/30/2013 FINDINGS: Slightly rotated to the RIGHT. Normal heart size, mediastinal contours, and pulmonary vascularity. Lungs clear. Chronic elevation LEFT diaphragm. No pleural effusion or pneumothorax. No acute osseous findings.  IMPRESSION: No acute abnormalities. Chronic elevation LEFT diaphragm. Electronically Signed   By: Lavonia Dana M.D.   On: 04/14/2017 12:59    Assessment & Plan:   There are no diagnoses linked to this encounter. I am having Mr. Micciche maintain his CENTRUM SILVER, Vitamin D-3, pyridOXINE, vitamin B-12, aspirin EC, latanoprost, brimonidine, folic acid, olmesartan, niacin, triamterene-hydrochlorothiazide, Vitamin D (Ergocalciferol), furosemide, traMADol-acetaminophen, apixaban, and apixaban. We will continue to administer furosemide.  No orders of the defined types were placed in this encounter.    Follow-up: No Follow-up on file.  Walker Kehr, MD

## 2017-05-31 ENCOUNTER — Encounter (INDEPENDENT_AMBULATORY_CARE_PROVIDER_SITE_OTHER): Payer: Self-pay | Admitting: Orthopaedic Surgery

## 2017-05-31 ENCOUNTER — Ambulatory Visit (INDEPENDENT_AMBULATORY_CARE_PROVIDER_SITE_OTHER): Payer: Medicare Other | Admitting: Orthopaedic Surgery

## 2017-05-31 VITALS — BP 120/72 | HR 67 | Ht 73.0 in | Wt 205.0 lb

## 2017-05-31 DIAGNOSIS — I82412 Acute embolism and thrombosis of left femoral vein: Secondary | ICD-10-CM

## 2017-05-31 NOTE — Progress Notes (Signed)
Office Visit Note   Patient: Edward [name]           Date of Birth: [birth year]           MRN: [id number] Visit Date: 05/31/2017              Requested by: Cassandria Anger, MD [place], [place] [address] PCP: Cassandria Anger, MD   Assessment & Plan: Visit Diagnoses:  1. Deep vein thrombosis (DVT) of femoral vein of left lower extremity, unspecified chronicity ([place])     Plan: He can return as needed. He needs to have bilateral short heads on due to venous stasis problems he's had in the past and also the edema that he has and lower extremities. Currently the long teds are bothering his knee with knee tightness problems and gathers up in the popliteal region. He can return to see me on a when necessary basis.  Follow-Up Instructions: Return if symptoms worsen or fail to improve.   Orders:  No orders of the defined types were placed in this encounter.  No orders of the defined types were placed in this encounter.     Procedures: No procedures performed   Clinical Data: No additional findings.   Subjective: Chief Complaint  Patient presents with  . Left Leg - Follow-up    HPI patient returns for follow-up he is one ElQUIS for is common femoral DVT extending down into the calf. He is wearing long teds that are thigh high just on the left leg. He states it bothers his knee which had the quad tendon repair due to rupture back in 2014. He has some venous stasis changes on his opposite right leg with mild swelling right lower extremity and then the scarring pretibially and would do better in bilateral short teds.  Review of Systems is systems updated unchanged from last office visit other than as mentioned in history of present illness. Of note is quad tendon repair on the left 2014 and DVT under treatment now for 6 weeks of his 6 month time span.   Objective: Vital Signs: BP 120/72   Pulse 67   Ht 6\' 1"  (1.854 m)   Wt 205 lb (93 kg)   BMI 27.05 kg/m     Physical Exam  Constitutional: He is oriented to person, place, and time. He appears well-developed and well-nourished.  HENT:  Head: Normocephalic and atraumatic.  Eyes: EOM are normal. Pupils are equal, round, and reactive to light.  Neck: No tracheal deviation present. No thyromegaly present.  Cardiovascular: Normal rate.   Pulmonary/Chest: Effort normal and breath sounds normal. No respiratory distress. He has no wheezes.  Abdominal: Soft. Bowel sounds are normal.  Musculoskeletal:  Well-healed left midline incision from quad tendon repair 2014. Mild swelling lower extremities. Right pretibial region shows scarring from previous venous stasis with dark discolored areas. Thigh high teds on the left. Cup flexion extension left knee. No pain with hip range of motion her knee range of motion.  Neurological: He is alert and oriented to person, place, and time.  Skin: Skin is warm and dry. Capillary refill takes less than 2 seconds.  Psychiatric: He has a normal mood and affect. His behavior is normal. Judgment and thought content normal.    Ortho Exam  Specialty Comments:  No specialty comments available.  Imaging: No results found.   PMFS History: Patient Active Problem List   Diagnosis Date Noted  . DVT (deep venous thrombosis) ([place]) 04/27/2017  .  Spinal stenosis at L4-L5 level 10/28/2016  . Degenerative arthritis of right knee 08/05/2016  . Spinal stenosis, lumbar region, with neurogenic claudication 03/08/2016  . Arthritis of left lower extremity 09/25/2015  . Gait disorder 09/24/2015  . Edema 04/03/2014  . Rupture quadriceps tendon 11/12/2013    Class: Diagnosis of  . Prostate cancer ([place]) 09/29/2012  . Colon polyp 09/27/2011  . Well adult exam 09/27/2011  . LOW BACK PAIN 07/16/2008  . ACQUIRED CYST OF KIDNEY 01/18/2008  . Diet-controlled type 2 diabetes mellitus ([place]) 01/18/2008  . VENOUS INSUFFICIENCY 10/20/2007  . CRF (chronic renal failure) 10/20/2007  .  HYPERLIPIDEMIA 09/12/2007  . Essential hypertension 09/12/2007  . HEMATURIA, MICROSCOPIC 09/12/2007   Past Medical History:  Diagnosis Date  . Back pain    Difficulty walking- left leg pain  . Chronic renal insufficiency    DR WEBB  . Complex renal cyst RIGHT--  MONITORED BY DR Risa Grill  . Crush injury, back   . Drug-induced low platelet count    from NSAIDS  . Elevated glucose   . Hearing loss   . History of swelling of feet   . Hyperlipidemia   . Hypertension   . Nocturia   . Prostate cancer ([place])   . Right knee injury    In past  . S/P radiation therapy 06/25/2015 through 08/19/2015   Prostate 7800 cGy in 40 sessions, seminal vesicles 5600 cGy in 40 sessions   . Venous insufficiency of leg   . Wears dentures    partial  . Wears glasses     Family History  Problem Relation Age of Onset  . Dementia Mother   . Nephrolithiasis Mother   . Tuberculosis Father   . Hypertension Father   . Nephrolithiasis Brother   . Prostate cancer Brother     Past Surgical History:  Procedure Laterality Date  . COLONOSCOPY  2/77/11  . CRYOABLATION  12/01/2012   Procedure: CRYO ABLATION PROSTATE;  Surgeon: Hanley Ben, MD;  Location: [facility];  Service: Urology;  Laterality: N/A;  . LUMBAR LAMINECTOMY/DECOMPRESSION MICRODISCECTOMY Left 10/28/2016   Procedure: Lumbar Laminectomy/decompression Microdiscectomy lumbar three- lumbar four, lumbar four to lumbar five, Left;  Surgeon: Kary Kos, MD;  Location: [facility];  Service: Neurosurgery;  Laterality: Left;  Lumbar Laminectomy/decompression Microdiscectomy lumbar three- lumbar four, lumbar four to lumbar five, Left  . QUADRICEPS TENDON REPAIR Left 10/31/2013   Procedure: Left Quadriceps Tendon Repair;  Surgeon: Marybelle Killings, MD;  Location: [facility];  Service: Orthopedics;  Laterality: Left;  Left Quadriceps Tendon Repair  . REPAIR KNEE LIGAMENT Left 2014   Social History   Occupational History  .  retired     Office manager   Social History Main Topics  . Smoking status: Never Smoker  . Smokeless tobacco: Never Used  . Alcohol use No  . Drug use: No  . Sexual activity: Yes

## 2017-06-17 ENCOUNTER — Other Ambulatory Visit (INDEPENDENT_AMBULATORY_CARE_PROVIDER_SITE_OTHER): Payer: Medicare Other

## 2017-06-17 ENCOUNTER — Ambulatory Visit (INDEPENDENT_AMBULATORY_CARE_PROVIDER_SITE_OTHER): Payer: Medicare Other | Admitting: Internal Medicine

## 2017-06-17 ENCOUNTER — Encounter: Payer: Self-pay | Admitting: Internal Medicine

## 2017-06-17 VITALS — BP 112/62 | HR 71 | Temp 98.1°F | Ht 73.0 in | Wt 209.0 lb

## 2017-06-17 DIAGNOSIS — M48061 Spinal stenosis, lumbar region without neurogenic claudication: Secondary | ICD-10-CM | POA: Diagnosis not present

## 2017-06-17 DIAGNOSIS — I82412 Acute embolism and thrombosis of left femoral vein: Secondary | ICD-10-CM

## 2017-06-17 DIAGNOSIS — R269 Unspecified abnormalities of gait and mobility: Secondary | ICD-10-CM | POA: Diagnosis not present

## 2017-06-17 DIAGNOSIS — C61 Malignant neoplasm of prostate: Secondary | ICD-10-CM | POA: Diagnosis not present

## 2017-06-17 DIAGNOSIS — E119 Type 2 diabetes mellitus without complications: Secondary | ICD-10-CM | POA: Diagnosis not present

## 2017-06-17 DIAGNOSIS — R202 Paresthesia of skin: Secondary | ICD-10-CM | POA: Diagnosis not present

## 2017-06-17 DIAGNOSIS — I82402 Acute embolism and thrombosis of unspecified deep veins of left lower extremity: Secondary | ICD-10-CM | POA: Diagnosis not present

## 2017-06-17 LAB — HEMOGLOBIN A1C: HEMOGLOBIN A1C: 7.2 % — AB (ref 4.6–6.5)

## 2017-06-17 LAB — BASIC METABOLIC PANEL
BUN: 33 mg/dL — ABNORMAL HIGH (ref 6–23)
CHLORIDE: 106 meq/L (ref 96–112)
CO2: 27 mEq/L (ref 19–32)
Calcium: 9.4 mg/dL (ref 8.4–10.5)
Creatinine, Ser: 1.71 mg/dL — ABNORMAL HIGH (ref 0.40–1.50)
GFR: 49.53 mL/min — AB (ref >60.00)
Glucose, Bld: 185 mg/dL — ABNORMAL HIGH (ref 70–99)
POTASSIUM: 4.3 meq/L (ref 3.5–5.1)
Sodium: 143 mEq/L (ref 135–145)

## 2017-06-17 LAB — VITAMIN B12: VITAMIN B 12: 815 pg/mL (ref 211–911)

## 2017-06-17 MED ORDER — PREDNISONE 10 MG PO TABS: ORAL_TABLET | ORAL | 1 refills | Status: DC

## 2017-06-17 NOTE — Patient Instructions (Signed)
Please see Dr Lorin Mercy if not better in 2 weeks.

## 2017-06-17 NOTE — Assessment & Plan Note (Signed)
On Lupron 

## 2017-06-17 NOTE — Progress Notes (Signed)
Subjective:  Patient ID: Edward [name], male    DOB: [birth year]  Age: 83 y.o. MRN: [id number]  CC: No chief complaint on file.   HPI Edward [name] presents for LLE DVT C/o worsening weakness - worse in the past two weeks - L foot feels "like a dead weight"...  F/u OA  Outpatient Medications Prior to Visit  Medication Sig Dispense Refill  . apixaban (ELIQUIS) 5 MG TABS tablet Take 1 tablet (5 mg total) by mouth 2 (two) times daily. 180 tablet 1  . aspirin EC 81 MG tablet Take 81 mg by mouth daily.    . brimonidine (ALPHAGAN P) 0.1 % SOLN Place 1 drop into both eyes 2 (two) times daily.    . Cholecalciferol (VITAMIN D-3) 1000 UNITS CAPS Take 2,000 Units by mouth every morning.     . folic acid (FOLVITE) 1 MG tablet Take 1 tablet (1 mg total) by mouth daily. 90 tablet 3  . furosemide (LASIX) 20 MG tablet Take 20-40 mgs by mouth 3 times per week as needed for leg swelling 90 tablet 3  . latanoprost (XALATAN) 0.005 % ophthalmic solution Place 1 drop into both eyes at bedtime.    . Multiple Vitamins-Minerals (CENTRUM SILVER) tablet Take 1 tablet by mouth daily. Reported on 05/25/2016    . niacin (NIASPAN) 500 MG CR tablet Take 1 tablet (500 mg total) by mouth daily. 90 tablet 3  . olmesartan (BENICAR) 40 MG tablet Take 1 tablet (40 mg total) by mouth daily. 90 tablet 3  . pyridOXINE (VITAMIN B-6) 100 MG tablet Take 200 mg by mouth daily.    . traMADol-acetaminophen (ULTRACET) 37.5-325 MG tablet Take 0.5-1 tablets by mouth every 8 (eight) hours as needed for moderate pain or severe pain. 60 tablet 2  . triamterene-hydrochlorothiazide (DYAZIDE) 37.5-25 MG capsule Take 1 each (1 capsule total) by mouth every morning. 90 capsule 3  . vitamin B-12 (CYANOCOBALAMIN) 1000 MCG tablet Take 1,000 mcg by mouth daily.    . Vitamin D, Ergocalciferol, (DRISDOL) 50000 units CAPS capsule Take 1 capsule (50,000 Units total) by mouth every 30 (thirty) days. First of the month 3 capsule 3    Facility-Administered Medications Prior to Visit  Medication Dose Route Frequency Provider Last Rate Last Dose  . furosemide (LASIX) tablet 20 mg  20 mg Oral Once Edward Pulley, DO        ROS Review of Systems  Constitutional: Negative for appetite change, fatigue and unexpected weight change.  HENT: Negative for congestion, nosebleeds, sneezing, sore throat and trouble swallowing.   Eyes: Negative for itching and visual disturbance.  Respiratory: Negative for cough.   Cardiovascular: Negative for chest pain, palpitations and leg swelling.  Gastrointestinal: Negative for abdominal distention, blood in stool, diarrhea and nausea.  Genitourinary: Negative for frequency and hematuria.  Musculoskeletal: Positive for gait problem. Negative for back pain, joint swelling and neck pain.  Skin: Negative for rash.  Neurological: Positive for weakness. Negative for dizziness, tremors and speech difficulty.  Psychiatric/Behavioral: Negative for agitation, dysphoric mood and sleep disturbance. The patient is not nervous/anxious.     Objective:  BP 112/62 (BP Location: Left Arm, Patient Position: Sitting, Cuff Size: Large)   Pulse 71   Temp 98.1 F (36.7 C) (Oral)   Ht 6\' 1"  (1.854 m)   Wt 209 lb (94.8 kg)   SpO2 98%   BMI 27.57 kg/m   BP Readings from Last 3 Encounters:  06/17/17 112/62  05/31/17 120/72  04/27/17  126/82    Wt Readings from Last 3 Encounters:  06/17/17 209 lb (94.8 kg)  05/31/17 205 lb (93 kg)  04/27/17 205 lb 1.3 oz (93 kg)    Physical Exam  Constitutional: He is oriented to person, place, and time. He appears well-developed. No distress.  NAD  HENT:  Mouth/Throat: Oropharynx is clear and moist.  Eyes: Conjunctivae are normal. Pupils are equal, round, and reactive to light.  Neck: Normal range of motion. No JVD present. No thyromegaly present.  Cardiovascular: Normal rate, regular rhythm, normal heart sounds and intact distal pulses.  Exam reveals no  gallop and no friction rub.   No murmur heard. Pulmonary/Chest: Effort normal and breath sounds normal. No respiratory distress. He has no wheezes. He has no rales. He exhibits no tenderness.  Abdominal: Soft. Bowel sounds are normal. He exhibits no distension and no mass. There is no tenderness. There is no rebound and no guarding.  Musculoskeletal: Normal range of motion. He exhibits no edema or tenderness.  Lymphadenopathy:    He has no cervical adenopathy.  Neurological: He is alert and oriented to person, place, and time. He has normal reflexes. No cranial nerve deficit. He exhibits normal muscle tone. He displays a negative Romberg sign. Coordination abnormal. Gait normal.  Skin: Skin is warm and dry. No rash noted.  Psychiatric: He has a normal mood and affect. His behavior is normal. Judgment and thought content normal.   Cane Ataxia L foot drop  Lab Results  Component Value Date   WBC 5.0 04/14/2017   HGB 11.9 (L) 04/14/2017   HCT 35.7 (L) 04/14/2017   PLT 175 04/14/2017   GLUCOSE 132 (H) 04/14/2017   CHOL 152 09/24/2015   TRIG 151.0 (H) 09/24/2015   HDL 34.10 (L) 09/24/2015   LDLCALC 88 09/24/2015   ALT 23 09/24/2015   AST 19 09/24/2015   NA 142 04/14/2017   K 4.9 04/14/2017   CL 107 04/14/2017   CREATININE 1.97 (H) 04/14/2017   BUN 35 (H) 04/14/2017   CO2 25 04/14/2017   TSH 1.34 09/24/2015   PSA 15.38 (H) 09/29/2012   INR 0.96 04/14/2017   HGBA1C 7.1 (H) 04/23/2016    Dg Chest Port 1 View  Result Date: 04/14/2017 CLINICAL DATA:  Shortness of breath today, dyspnea, hypertension, hyperlipidemia, prostate cancer post radiation therapy EXAM: PORTABLE CHEST 1 VIEW COMPARISON:  Portable exam 1249 hours compared to 10/30/2013 FINDINGS: Slightly rotated to the RIGHT. Normal heart size, mediastinal contours, and pulmonary vascularity. Lungs clear. Chronic elevation LEFT diaphragm. No pleural effusion or pneumothorax. No acute osseous findings. IMPRESSION: No acute  abnormalities. Chronic elevation LEFT diaphragm. Electronically Signed   By: Edward [name] M.D.   On: 04/14/2017 12:59    Assessment & Plan:   There are no diagnoses linked to this encounter. I am having Edward [name] maintain his CENTRUM SILVER, Vitamin D-3, pyridOXINE, vitamin B-12, aspirin EC, latanoprost, brimonidine, folic acid, olmesartan, niacin, triamterene-hydrochlorothiazide, Vitamin D (Ergocalciferol), furosemide, traMADol-acetaminophen, and apixaban. We will continue to administer furosemide.  No orders of the defined types were placed in this encounter.    Follow-up: No Follow-up on file.  Walker Kehr, MD

## 2017-06-17 NOTE — Assessment & Plan Note (Signed)
On Eliquis

## 2017-06-17 NOTE — Assessment & Plan Note (Signed)
Labs

## 2017-06-17 NOTE — Assessment & Plan Note (Signed)
Worse Prednisone 10 mg: take 4 tabs a day x 3 days; then 3 tabs a day x 4 days; then 2 tabs a day x 4 days, then 1 tab a day x 6 days, then stop. Take pc. LS MRI if not better

## 2017-06-17 NOTE — Assessment & Plan Note (Addendum)
Worse - L foot drop 6/18 Plan - as above. See Dr Lorin Mercy if not better in 2 weeks. 4/18: X-rays lumbar spine shows multilevel lumbar spondylosis. xrays documented  he does have L4-5 spinal listhesis. On flexion-extension views this  appears unchanged from x-rays that are in the epic system October 2017. No  acute findings.

## 2017-06-20 ENCOUNTER — Other Ambulatory Visit: Payer: Self-pay | Admitting: Internal Medicine

## 2017-06-20 MED ORDER — METFORMIN HCL 500 MG PO TABS: 500.0000 mg | ORAL_TABLET | Freq: Two times a day (BID) | ORAL | 11 refills | Status: DC

## 2017-06-28 ENCOUNTER — Ambulatory Visit (INDEPENDENT_AMBULATORY_CARE_PROVIDER_SITE_OTHER): Payer: Medicare Other | Admitting: Orthopaedic Surgery

## 2017-06-28 ENCOUNTER — Encounter (INDEPENDENT_AMBULATORY_CARE_PROVIDER_SITE_OTHER): Payer: Self-pay | Admitting: Orthopaedic Surgery

## 2017-06-28 VITALS — BP 110/62 | HR 89 | Temp 97.2°F | Ht 73.0 in | Wt 209.0 lb

## 2017-06-28 DIAGNOSIS — I824Y2 Acute embolism and thrombosis of unspecified deep veins of left proximal lower extremity: Secondary | ICD-10-CM | POA: Diagnosis not present

## 2017-06-28 NOTE — Progress Notes (Signed)
Office Visit Note   Patient: Edward [name]           Date of Birth: [birth year]           MRN: [id number] Visit Date: 06/28/2017              Requested by: Cassandria Anger, MD [place], [place] [address] PCP: Cassandria Anger, MD   Assessment & Plan: Visit Diagnoses:  1. Deep vein thrombosis (DVT) of proximal vein of left lower extremity, unspecified chronicity ([facility])     Plan: Prescription given for half of the continues on his left foot. He should be over again an off-the-shelf AFO and it should help his ambulation. I encouraged him to use his walker when he first started using the AFO. He can follow-up here on an as needed basis.  Follow-Up Instructions: No Follow-up on file.   Orders:  No orders of the defined types were placed in this encounter.  No orders of the defined types were placed in this encounter.     Procedures: No procedures performed   Clinical Data: No additional findings.   Subjective: No chief complaint on file.   HPI patient returns follow-up for DVT. He is on eliquis  was being followed by Dr. Alain Marion.  Swelling in his legs down. He still has some partial foot drop after his decompression surgery with anterolisthesis at L4-5 grade 1-2.Marland Kitchen Patient has a cane that he uses his walker at home but doesn't really use it. Difficulty when he turns and basically asked to leave his left foot stepped on the floor and pivots around it. He asked about if a brace would help his leg.  Review of Systems 14 point review of systems is updated and is unchanged from his last office visit other than as listed above.   Objective: Vital Signs: BP 110/62 (BP Location: Right Arm, Patient Position: Sitting, Cuff Size: Normal)   Pulse 89   Temp (!) 97.2 F (36.2 C) (Oral)   Ht 6\' 1"  (1.854 m)   Wt 209 lb (94.8 kg)   BMI 27.57 kg/m   Physical Exam  Constitutional: He is oriented to person, place, and time. He appears well-developed and  well-nourished.  HENT:  Head: Normocephalic and atraumatic.  Eyes: EOM are normal. Pupils are equal, round, and reactive to light.  Neck: No tracheal deviation present. No thyromegaly present.  Cardiovascular: Normal rate.   Pulmonary/Chest: Effort normal. He has no wheezes.  Abdominal: Soft. Bowel sounds are normal.  Musculoskeletal:  Well-healed incision left midline knee from previous quad tendon repair. Lumbar incisions well-healed. He has the weakness ankle dorsiflexion on the left. Distal pulses are palpable.  Neurological: He is alert and oriented to person, place, and time.  Skin: Skin is warm and dry. Capillary refill takes less than 2 seconds.  Psychiatric: He has a normal mood and affect. His behavior is normal. Judgment and thought content normal.    Ortho Exam  Specialty Comments:  No specialty comments available.  Imaging: No results found.   PMFS History: Patient Active Problem List   Diagnosis Date Noted  . Deep vein thrombosis (DVT) of femoral vein of left lower extremity ([place]) 05/31/2017  . DVT (deep venous thrombosis) ([place]) 04/27/2017  . Spinal stenosis at L4-L5 level 10/28/2016  . Degenerative arthritis of right knee 08/05/2016  . Spinal stenosis, lumbar region, with neurogenic claudication 03/08/2016  . Arthritis of left lower extremity 09/25/2015  . Gait disorder 09/24/2015  .  Edema 04/03/2014  . Rupture quadriceps tendon 11/12/2013    Class: Diagnosis of  . Prostate cancer ([place]) 09/29/2012  . Colon polyp 09/27/2011  . Well adult exam 09/27/2011  . LOW BACK PAIN 07/16/2008  . ACQUIRED CYST OF KIDNEY 01/18/2008  . Diet-controlled type 2 diabetes mellitus ([place]) 01/18/2008  . VENOUS INSUFFICIENCY 10/20/2007  . CRF (chronic renal failure) 10/20/2007  . HYPERLIPIDEMIA 09/12/2007  . Essential hypertension 09/12/2007  . HEMATURIA, MICROSCOPIC 09/12/2007   Past Medical History:  Diagnosis Date  . Back pain    Difficulty walking- left leg pain  .  Chronic renal insufficiency    DR WEBB  . Complex renal cyst RIGHT--  MONITORED BY DR Risa Grill  . Crush injury, back   . Drug-induced low platelet count    from NSAIDS  . Elevated glucose   . Hearing loss   . History of swelling of feet   . Hyperlipidemia   . Hypertension   . Nocturia   . Prostate cancer ([place])   . Right knee injury    In past  . S/P radiation therapy 06/25/2015 through 08/19/2015   Prostate 7800 cGy in 40 sessions, seminal vesicles 5600 cGy in 40 sessions   . Venous insufficiency of leg   . Wears dentures    partial  . Wears glasses     Family History  Problem Relation Age of Onset  . Dementia Mother   . Nephrolithiasis Mother   . Tuberculosis Father   . Hypertension Father   . Nephrolithiasis Brother   . Prostate cancer Brother     Past Surgical History:  Procedure Laterality Date  . COLONOSCOPY  2/77/11  . CRYOABLATION  12/01/2012   Procedure: CRYO ABLATION PROSTATE;  Surgeon: Hanley Ben, MD;  Location: [facility];  Service: Urology;  Laterality: N/A;  . LUMBAR LAMINECTOMY/DECOMPRESSION MICRODISCECTOMY Left 10/28/2016   Procedure: Lumbar Laminectomy/decompression Microdiscectomy lumbar three- lumbar four, lumbar four to lumbar five, Left;  Surgeon: Kary Kos, MD;  Location: [facility];  Service: Neurosurgery;  Laterality: Left;  Lumbar Laminectomy/decompression Microdiscectomy lumbar three- lumbar four, lumbar four to lumbar five, Left  . QUADRICEPS TENDON REPAIR Left 10/31/2013   Procedure: Left Quadriceps Tendon Repair;  Surgeon: Marybelle Killings, MD;  Location: [facility];  Service: Orthopedics;  Laterality: Left;  Left Quadriceps Tendon Repair  . REPAIR KNEE LIGAMENT Left 2014   Social History   Occupational History  . retired     Office manager   Social History Main Topics  . Smoking status: Never Smoker  . Smokeless tobacco: Never Used  . Alcohol use No  . Drug use: No  . Sexual activity: Yes

## 2017-06-29 ENCOUNTER — Ambulatory Visit: Payer: Medicare Other | Admitting: Internal Medicine

## 2017-07-05 ENCOUNTER — Telehealth (INDEPENDENT_AMBULATORY_CARE_PROVIDER_SITE_OTHER): Payer: Self-pay | Admitting: Orthopaedic Surgery

## 2017-07-05 NOTE — Telephone Encounter (Signed)
06/28/2017 OV NOTE FAXED TO Alison Stalling

## 2017-07-11 DIAGNOSIS — C61 Malignant neoplasm of prostate: Secondary | ICD-10-CM | POA: Diagnosis not present

## 2017-07-14 ENCOUNTER — Ambulatory Visit: Payer: Medicare Other | Admitting: Internal Medicine

## 2017-07-21 ENCOUNTER — Encounter: Payer: Self-pay | Admitting: Internal Medicine

## 2017-07-21 ENCOUNTER — Ambulatory Visit (INDEPENDENT_AMBULATORY_CARE_PROVIDER_SITE_OTHER): Payer: Medicare Other | Admitting: Internal Medicine

## 2017-07-21 DIAGNOSIS — C61 Malignant neoplasm of prostate: Secondary | ICD-10-CM

## 2017-07-21 DIAGNOSIS — N183 Chronic kidney disease, stage 3 unspecified: Secondary | ICD-10-CM

## 2017-07-21 DIAGNOSIS — I10 Essential (primary) hypertension: Secondary | ICD-10-CM

## 2017-07-21 DIAGNOSIS — M48061 Spinal stenosis, lumbar region without neurogenic claudication: Secondary | ICD-10-CM

## 2017-07-21 DIAGNOSIS — I82402 Acute embolism and thrombosis of unspecified deep veins of left lower extremity: Secondary | ICD-10-CM | POA: Diagnosis not present

## 2017-07-21 DIAGNOSIS — E119 Type 2 diabetes mellitus without complications: Secondary | ICD-10-CM | POA: Diagnosis not present

## 2017-07-21 MED ORDER — METFORMIN HCL 500 MG PO TABS
500.0000 mg | ORAL_TABLET | Freq: Two times a day (BID) | ORAL | 3 refills | Status: DC
Start: 2017-07-21 — End: 2017-10-24

## 2017-07-21 NOTE — Assessment & Plan Note (Signed)
Edward [name] Diazide

## 2017-07-21 NOTE — Assessment & Plan Note (Signed)
BMET 

## 2017-07-21 NOTE — Assessment & Plan Note (Signed)
Metformin 

## 2017-07-21 NOTE — Progress Notes (Signed)
Subjective:  Patient ID: Edward [name], male    DOB: 01-21-1933  Age: 83 y.o. MRN: [id number]  CC: No chief complaint on file.   HPI Edward [name] presents for DVT, DM, prostate Ca and L foot drop f/u  Outpatient Medications Prior to Visit  Medication Sig Dispense Refill  . apixaban (ELIQUIS) 5 MG TABS tablet Take 1 tablet (5 mg total) by mouth 2 (two) times daily. 180 tablet 1  . aspirin EC 81 MG tablet Take 81 mg by mouth daily.    . brimonidine (ALPHAGAN P) 0.1 % SOLN Place 1 drop into both eyes 2 (two) times daily.    . Cholecalciferol (VITAMIN D-3) 1000 UNITS CAPS Take 2,000 Units by mouth every morning.     . folic acid (FOLVITE) 1 MG tablet Take 1 tablet (1 mg total) by mouth daily. 90 tablet 3  . furosemide (LASIX) 20 MG tablet Take 20-40 mgs by mouth 3 times per week as needed for leg swelling 90 tablet 3  . latanoprost (XALATAN) 0.005 % ophthalmic solution Place 1 drop into both eyes at bedtime.    . metFORMIN (GLUCOPHAGE) 500 MG tablet Take 1 tablet (500 mg total) by mouth 2 (two) times daily with a meal. 30 tablet 11  . Multiple Vitamins-Minerals (CENTRUM SILVER) tablet Take 1 tablet by mouth daily. Reported on 05/25/2016    . niacin (NIASPAN) 500 MG CR tablet Take 1 tablet (500 mg total) by mouth daily. 90 tablet 3  . olmesartan (BENICAR) 40 MG tablet Take 1 tablet (40 mg total) by mouth daily. 90 tablet 3  . pyridOXINE (VITAMIN B-6) 100 MG tablet Take 200 mg by mouth daily.    . traMADol-acetaminophen (ULTRACET) 37.5-325 MG tablet Take 0.5-1 tablets by mouth every 8 (eight) hours as needed for moderate pain or severe pain. 60 tablet 2  . triamterene-hydrochlorothiazide (DYAZIDE) 37.5-25 MG capsule Take 1 each (1 capsule total) by mouth every morning. 90 capsule 3  . vitamin B-12 (CYANOCOBALAMIN) 1000 MCG tablet Take 1,000 mcg by mouth daily.    . Vitamin D, Ergocalciferol, (DRISDOL) 50000 units CAPS capsule Take 1 capsule (50,000 Units total) by mouth every 30 (thirty)  days. First of the month 3 capsule 3  . predniSONE (DELTASONE) 10 MG tablet Prednisone 10 mg: take 4 tabs a day x 3 days; then 3 tabs a day x 4 days; then 2 tabs a day x 4 days, then 1 tab a day x 6 days, then stop. Take pc. 38 tablet 1   Facility-Administered Medications Prior to Visit  Medication Dose Route Frequency Provider Last Rate Last Dose  . furosemide (LASIX) tablet 20 mg  20 mg Oral Once Lyndal Pulley, DO        ROS Review of Systems  Constitutional: Negative for appetite change, fatigue and unexpected weight change.  HENT: Negative for congestion, nosebleeds, sneezing, sore throat and trouble swallowing.   Eyes: Negative for itching and visual disturbance.  Respiratory: Negative for cough.   Cardiovascular: Negative for chest pain, palpitations and leg swelling.  Gastrointestinal: Negative for abdominal distention, blood in stool, diarrhea and nausea.  Genitourinary: Negative for frequency and hematuria.  Musculoskeletal: Positive for arthralgias, back pain and gait problem. Negative for joint swelling and neck pain.  Skin: Negative for rash.  Neurological: Positive for weakness. Negative for dizziness, tremors and speech difficulty.  Psychiatric/Behavioral: Negative for agitation, dysphoric mood and sleep disturbance. The patient is not nervous/anxious.     Objective:  BP  112/68 (BP Location: Left Arm, Patient Position: Sitting, Cuff Size: Normal)   Pulse 79   Temp 98.1 F (36.7 C) (Oral)   Ht 6\' 1"  (1.854 m)   Wt 204 lb (92.5 kg)   SpO2 98%   BMI 26.91 kg/m   BP Readings from Last 3 Encounters:  07/21/17 112/68  06/28/17 110/62  06/17/17 112/62    Wt Readings from Last 3 Encounters:  07/21/17 204 lb (92.5 kg)  06/28/17 209 lb (94.8 kg)  06/17/17 209 lb (94.8 kg)    Physical Exam  Constitutional: He is oriented to person, place, and time. He appears well-developed. No distress.  NAD  HENT:  Mouth/Throat: Oropharynx is clear and moist.  Eyes: Pupils  are equal, round, and reactive to light. Conjunctivae are normal.  Neck: Normal range of motion. No JVD present. No thyromegaly present.  Cardiovascular: Normal rate, regular rhythm, normal heart sounds and intact distal pulses.  Exam reveals no gallop and no friction rub.   No murmur heard. Pulmonary/Chest: Effort normal and breath sounds normal. No respiratory distress. He has no wheezes. He has no rales. He exhibits no tenderness.  Abdominal: Soft. Bowel sounds are normal. He exhibits no distension and no mass. There is no tenderness. There is no rebound and no guarding.  Musculoskeletal: Normal range of motion. He exhibits edema and tenderness.  Lymphadenopathy:    He has no cervical adenopathy.  Neurological: He is alert and oriented to person, place, and time. He has normal reflexes. No cranial nerve deficit. He exhibits normal muscle tone. He displays a negative Romberg sign. Coordination abnormal. Gait normal.  Skin: Skin is warm and dry. No rash noted.  Psychiatric: He has a normal mood and affect. His behavior is normal. Judgment and thought content normal.  L ankle brace Cane LS painful w/ROM  Lab Results  Component Value Date   WBC 5.0 04/14/2017   HGB 11.9 (L) 04/14/2017   HCT 35.7 (L) 04/14/2017   PLT 175 04/14/2017   GLUCOSE 185 (H) 06/17/2017   CHOL 152 09/24/2015   TRIG 151.0 (H) 09/24/2015   HDL 34.10 (L) 09/24/2015   LDLCALC 88 09/24/2015   ALT 23 09/24/2015   AST 19 09/24/2015   NA 143 06/17/2017   K 4.3 06/17/2017   CL 106 06/17/2017   CREATININE 1.71 (H) 06/17/2017   BUN 33 (H) 06/17/2017   CO2 27 06/17/2017   TSH 1.34 09/24/2015   PSA 15.38 (H) 09/29/2012   INR 0.96 04/14/2017   HGBA1C 7.2 (H) 06/17/2017    Dg Chest Port 1 View  Result Date: 04/14/2017 CLINICAL DATA:  Shortness of breath today, dyspnea, hypertension, hyperlipidemia, prostate cancer post radiation therapy EXAM: PORTABLE CHEST 1 VIEW COMPARISON:  Portable exam 1249 hours compared to  10/30/2013 FINDINGS: Slightly rotated to the RIGHT. Normal heart size, mediastinal contours, and pulmonary vascularity. Lungs clear. Chronic elevation LEFT diaphragm. No pleural effusion or pneumothorax. No acute osseous findings. IMPRESSION: No acute abnormalities. Chronic elevation LEFT diaphragm. Electronically Signed   By: Lavonia Dana M.D.   On: 04/14/2017 12:59    Assessment & Plan:   There are no diagnoses linked to this encounter. I have discontinued Mr. Flaum's predniSONE. I am also having him maintain his CENTRUM SILVER, Vitamin D-3, pyridOXINE, vitamin B-12, aspirin EC, latanoprost, brimonidine, folic acid, olmesartan, niacin, triamterene-hydrochlorothiazide, Vitamin D (Ergocalciferol), furosemide, traMADol-acetaminophen, apixaban, and metFORMIN. We will continue to administer furosemide.  No orders of the defined types were placed in this encounter.  Follow-up: No Follow-up on file.  Walker Kehr, MD

## 2017-07-21 NOTE — Assessment & Plan Note (Signed)
Lupron - off

## 2017-07-21 NOTE — Assessment & Plan Note (Signed)
Eliquis 

## 2017-07-21 NOTE — Assessment & Plan Note (Signed)
F/u w/Dr Lorin Mercy

## 2017-07-27 DIAGNOSIS — C61 Malignant neoplasm of prostate: Secondary | ICD-10-CM | POA: Diagnosis not present

## 2017-07-27 DIAGNOSIS — H401133 Primary open-angle glaucoma, bilateral, severe stage: Secondary | ICD-10-CM | POA: Diagnosis not present

## 2017-08-01 ENCOUNTER — Other Ambulatory Visit: Payer: Self-pay | Admitting: Neurosurgery

## 2017-08-01 DIAGNOSIS — M5440 Lumbago with sciatica, unspecified side: Secondary | ICD-10-CM | POA: Diagnosis not present

## 2017-08-01 DIAGNOSIS — M5416 Radiculopathy, lumbar region: Secondary | ICD-10-CM

## 2017-08-15 ENCOUNTER — Ambulatory Visit
Admission: RE | Admit: 2017-08-15 | Discharge: 2017-08-15 | Disposition: A | Discharge status: Home / Self Care | Payer: Medicare Other | Source: Ambulatory Visit | Attending: Neurosurgery | Admitting: Neurosurgery

## 2017-08-15 DIAGNOSIS — M48061 Spinal stenosis, lumbar region without neurogenic claudication: Secondary | ICD-10-CM | POA: Diagnosis not present

## 2017-08-15 DIAGNOSIS — M5416 Radiculopathy, lumbar region: Secondary | ICD-10-CM

## 2017-08-15 MED ORDER — GADOBENATE DIMEGLUMINE 529 MG/ML IV SOLN
10.0000 mL | Freq: Once | INTRAVENOUS | Status: AC | PRN
  Administered 2017-08-15: 10 mL via INTRAVENOUS

## 2017-09-20 DIAGNOSIS — M4807 Spinal stenosis, lumbosacral region: Secondary | ICD-10-CM | POA: Diagnosis not present

## 2017-10-04 IMAGING — CR DG LUMBAR SPINE 2-3V
2 series · 2 of 2 positions shown · non-contrast
Comparison: 10/14/2016 .

CLINICAL DATA: Laminectomy.

EXAM:
LUMBAR SPINE - 2-3 VIEW

[lateral (1 of 2)]
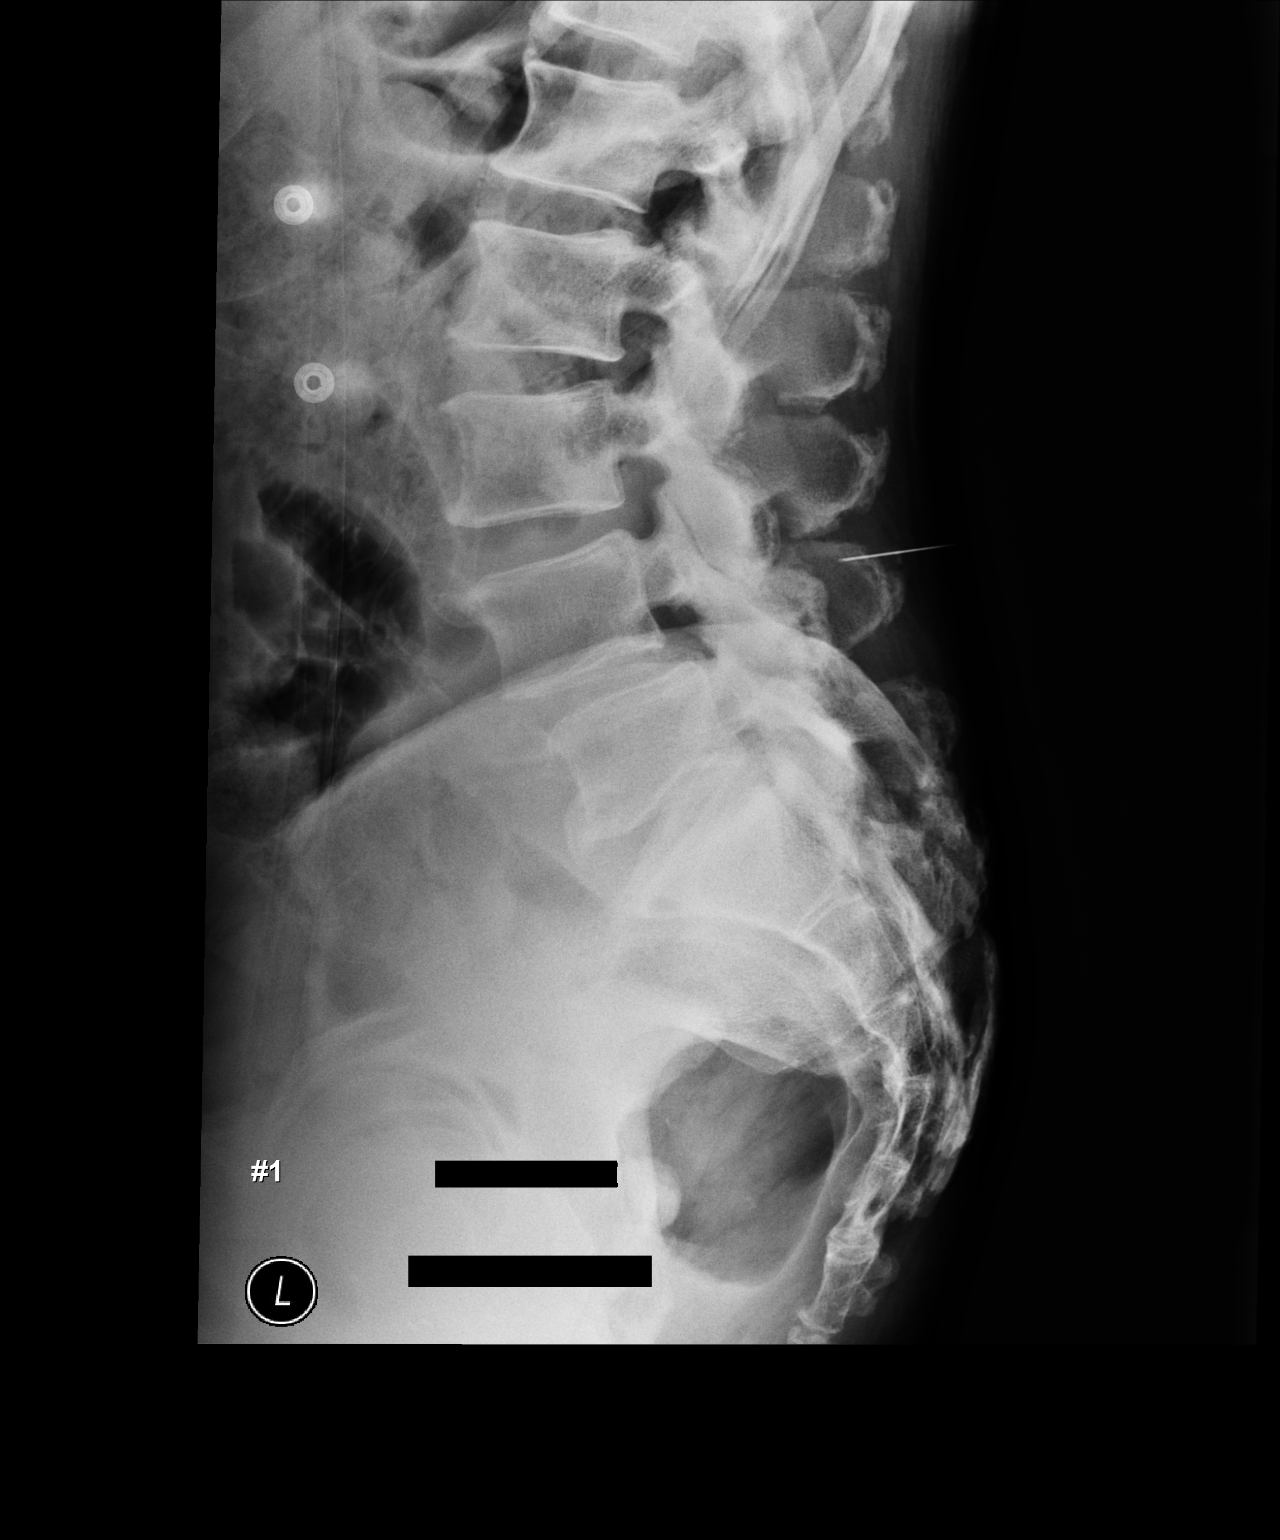

[lateral (2 of 2)]
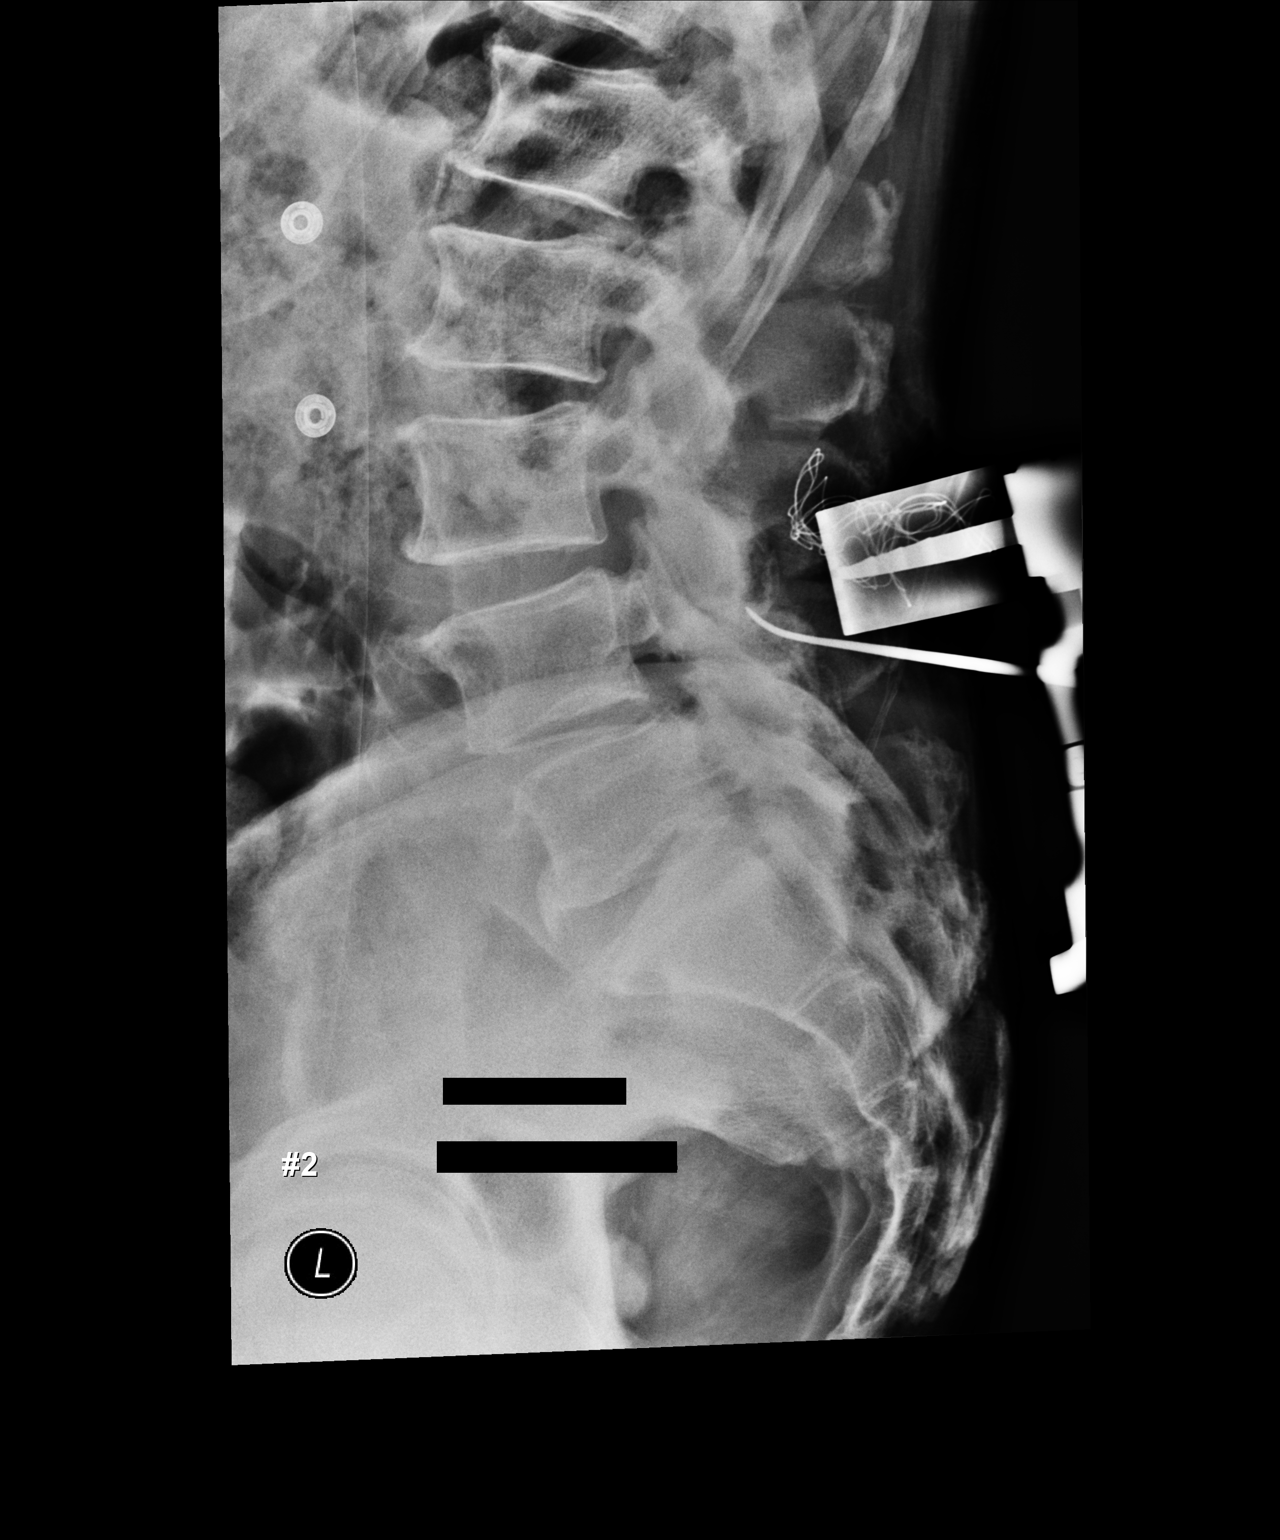

[2 of 2 positions shown; findings below may reference images not displayed]

FINDINGS: Lumbar vertebra numbered with lowest segmented lumbar shaped
vertebra as L5. Metallic marker noted posteriorly at L4. Diffuse
degenerative change. Mild anterolisthesis L4-L5 .
IMPRESSION: Metallic marker noted posteriorly at L4.

## 2017-10-24 ENCOUNTER — Encounter: Payer: Self-pay | Admitting: Internal Medicine

## 2017-10-24 ENCOUNTER — Other Ambulatory Visit (INDEPENDENT_AMBULATORY_CARE_PROVIDER_SITE_OTHER): Payer: Medicare Other

## 2017-10-24 ENCOUNTER — Ambulatory Visit (INDEPENDENT_AMBULATORY_CARE_PROVIDER_SITE_OTHER): Payer: Medicare Other | Admitting: Internal Medicine

## 2017-10-24 VITALS — BP 124/66 | HR 66 | Temp 97.5°F | Ht 73.0 in | Wt 208.0 lb

## 2017-10-24 DIAGNOSIS — E119 Type 2 diabetes mellitus without complications: Secondary | ICD-10-CM

## 2017-10-24 DIAGNOSIS — Z23 Encounter for immunization: Secondary | ICD-10-CM

## 2017-10-24 DIAGNOSIS — M48061 Spinal stenosis, lumbar region without neurogenic claudication: Secondary | ICD-10-CM

## 2017-10-24 DIAGNOSIS — E1122 Type 2 diabetes mellitus with diabetic chronic kidney disease: Secondary | ICD-10-CM | POA: Diagnosis not present

## 2017-10-24 DIAGNOSIS — K5900 Constipation, unspecified: Secondary | ICD-10-CM

## 2017-10-24 DIAGNOSIS — M48062 Spinal stenosis, lumbar region with neurogenic claudication: Secondary | ICD-10-CM

## 2017-10-24 DIAGNOSIS — N183 Chronic kidney disease, stage 3 unspecified: Secondary | ICD-10-CM

## 2017-10-24 DIAGNOSIS — I10 Essential (primary) hypertension: Secondary | ICD-10-CM

## 2017-10-24 DIAGNOSIS — R269 Unspecified abnormalities of gait and mobility: Secondary | ICD-10-CM

## 2017-10-24 LAB — BASIC METABOLIC PANEL
BUN: 32 mg/dL — ABNORMAL HIGH (ref 6–23)
CALCIUM: 9.6 mg/dL (ref 8.4–10.5)
CHLORIDE: 102 meq/L (ref 96–112)
CO2: 30 meq/L (ref 19–32)
Creatinine, Ser: 1.79 mg/dL — ABNORMAL HIGH (ref 0.40–1.50)
GFR: 46.94 mL/min — ABNORMAL LOW (ref >60.00)
GLUCOSE: 133 mg/dL — AB (ref 70–99)
POTASSIUM: 5.1 meq/L (ref 3.5–5.1)
SODIUM: 141 meq/L (ref 135–145)

## 2017-10-24 LAB — HEMOGLOBIN A1C: Hgb A1c MFr Bld: 6.2 % (ref 4.6–6.5)

## 2017-10-24 MED ORDER — METFORMIN HCL 500 MG PO TABS: 500.0000 mg | ORAL_TABLET | Freq: Two times a day (BID) | ORAL | 3 refills | Status: DC

## 2017-10-24 MED ORDER — NIACIN ER (ANTIHYPERLIPIDEMIC) 500 MG PO TBCR: 500.0000 mg | EXTENDED_RELEASE_TABLET | Freq: Every day | ORAL | 3 refills | Status: DC

## 2017-10-24 MED ORDER — OLMESARTAN MEDOXOMIL 40 MG PO TABS: 40.0000 mg | ORAL_TABLET | Freq: Every day | ORAL | 3 refills | Status: DC

## 2017-10-24 MED ORDER — FOLIC ACID 1 MG PO TABS: 1.0000 mg | ORAL_TABLET | Freq: Every day | ORAL | 3 refills | Status: DC

## 2017-10-24 MED ORDER — VITAMIN D (ERGOCALCIFEROL) 1.25 MG (50000 UNIT) PO CAPS: 50000.0000 [IU] | ORAL_CAPSULE | ORAL | 3 refills | Status: DC

## 2017-10-24 MED ORDER — APIXABAN 5 MG PO TABS: 5.0000 mg | ORAL_TABLET | Freq: Two times a day (BID) | ORAL | 3 refills | Status: DC

## 2017-10-24 MED ORDER — TRIAMTERENE-HCTZ 37.5-25 MG PO CAPS: 1.0000 | ORAL_CAPSULE | Freq: Every morning | ORAL | 3 refills | Status: DC

## 2017-10-24 NOTE — Assessment & Plan Note (Signed)
Dulcolax qod

## 2017-10-24 NOTE — Assessment & Plan Note (Addendum)
On Benicar,  D/c Dyazide

## 2017-10-24 NOTE — Assessment & Plan Note (Signed)
D/c Dyazide

## 2017-10-24 NOTE — Progress Notes (Signed)
Subjective:  Patient ID: Edward [name], male    DOB: August 10, 1933  Age: 83 y.o. MRN: [id number]  CC: No chief complaint on file.   HPI Edward [name] presents for HTN, L foot drop, DM f/u. C/o L foot drop and leg weakness... C/o apathy  Outpatient Medications Prior to Visit  Medication Sig Dispense Refill  . aspirin EC 81 MG tablet Take 81 mg by mouth daily.    . brimonidine (ALPHAGAN P) 0.1 % SOLN Place 1 drop into both eyes 2 (two) times daily.    . Cholecalciferol (VITAMIN D-3) 1000 UNITS CAPS Take 2,000 Units by mouth every morning.     . furosemide (LASIX) 20 MG tablet Take 20-40 mgs by mouth 3 times per week as needed for leg swelling 90 tablet 3  . latanoprost (XALATAN) 0.005 % ophthalmic solution Place 1 drop into both eyes at bedtime.    . Multiple Vitamins-Minerals (CENTRUM SILVER) tablet Take 1 tablet by mouth daily. Reported on 05/25/2016    . pyridOXINE (VITAMIN B-6) 100 MG tablet Take 200 mg by mouth daily.    . traMADol-acetaminophen (ULTRACET) 37.5-325 MG tablet Take 0.5-1 tablets by mouth every 8 (eight) hours as needed for moderate pain or severe pain. 60 tablet 2  . vitamin B-12 (CYANOCOBALAMIN) 1000 MCG tablet Take 1,000 mcg by mouth daily.    Marland Kitchen apixaban (ELIQUIS) 5 MG TABS tablet Take 1 tablet (5 mg total) by mouth 2 (two) times daily. 786 tablet 1  . folic acid (FOLVITE) 1 MG tablet Take 1 tablet (1 mg total) by mouth daily. 90 tablet 3  . metFORMIN (GLUCOPHAGE) 500 MG tablet Take 1 tablet (500 mg total) by mouth 2 (two) times daily with a meal. 180 tablet 3  . niacin (NIASPAN) 500 MG CR tablet Take 1 tablet (500 mg total) by mouth daily. 90 tablet 3  . olmesartan (BENICAR) 40 MG tablet Take 1 tablet (40 mg total) by mouth daily. 90 tablet 3  . triamterene-hydrochlorothiazide (DYAZIDE) 37.5-25 MG capsule Take 1 each (1 capsule total) by mouth every morning. 90 capsule 3  . Vitamin D, Ergocalciferol, (DRISDOL) 50000 units CAPS capsule Take 1 capsule (50,000 Units  total) by mouth every 30 (thirty) days. First of the month 3 capsule 3   Facility-Administered Medications Prior to Visit  Medication Dose Route Frequency Provider Last Rate Last Dose  . furosemide (LASIX) tablet 20 mg  20 mg Oral Once Lyndal Pulley, DO        ROS Review of Systems  Constitutional: Positive for fatigue. Negative for appetite change and unexpected weight change.  HENT: Negative for congestion, nosebleeds, sneezing, sore throat and trouble swallowing.   Eyes: Negative for itching and visual disturbance.  Respiratory: Negative for cough.   Cardiovascular: Negative for chest pain, palpitations and leg swelling.  Gastrointestinal: Positive for constipation. Negative for abdominal distention, blood in stool, diarrhea and nausea.  Genitourinary: Negative for frequency and hematuria.  Musculoskeletal: Positive for arthralgias, back pain, gait problem and neck stiffness. Negative for joint swelling and neck pain.  Skin: Negative for rash.  Neurological: Negative for dizziness, tremors, speech difficulty and weakness.  Psychiatric/Behavioral: Positive for decreased concentration. Negative for agitation, dysphoric mood and sleep disturbance. The patient is not nervous/anxious.     Objective:  BP 124/66 (BP Location: Left Arm, Patient Position: Sitting, Cuff Size: Large)   Pulse 66   Temp (!) 97.5 F (36.4 C) (Oral)   Ht 6\' 1"  (1.854 m)   Wt  208 lb (94.3 kg)   SpO2 99%   BMI 27.44 kg/m   BP Readings from Last 3 Encounters:  10/24/17 124/66  07/21/17 112/68  06/28/17 110/62    Wt Readings from Last 3 Encounters:  10/24/17 208 lb (94.3 kg)  07/21/17 204 lb (92.5 kg)  06/28/17 209 lb (94.8 kg)    Physical Exam  Constitutional: He is oriented to person, place, and time. He appears well-developed. No distress.  NAD  HENT:  Mouth/Throat: Oropharynx is clear and moist.  Eyes: Conjunctivae are normal. Pupils are equal, round, and reactive to light.  Neck: Normal  range of motion. No JVD present. No thyromegaly present.  Cardiovascular: Normal rate, regular rhythm, normal heart sounds and intact distal pulses. Exam reveals no gallop and no friction rub.  No murmur heard. Pulmonary/Chest: Effort normal and breath sounds normal. No respiratory distress. He has no wheezes. He has no rales. He exhibits no tenderness.  Abdominal: Soft. Bowel sounds are normal. He exhibits no distension and no mass. There is no tenderness. There is no rebound and no guarding.  Musculoskeletal: Normal range of motion. He exhibits edema and tenderness.  Lymphadenopathy:    He has no cervical adenopathy.  Neurological: He is alert and oriented to person, place, and time. He has normal reflexes. No cranial nerve deficit. He exhibits normal muscle tone. He displays a negative Romberg sign. Coordination abnormal. Gait normal.  Skin: Skin is warm and dry. No rash noted.  Psychiatric: He has a normal mood and affect. His behavior is normal. Judgment and thought content normal.  walker L ankle brace   Lab Results  Component Value Date   WBC 5.0 04/14/2017   HGB 11.9 (L) 04/14/2017   HCT 35.7 (L) 04/14/2017   PLT 175 04/14/2017   GLUCOSE 185 (H) 06/17/2017   CHOL 152 09/24/2015   TRIG 151.0 (H) 09/24/2015   HDL 34.10 (L) 09/24/2015   LDLCALC 88 09/24/2015   ALT 23 09/24/2015   AST 19 09/24/2015   NA 143 06/17/2017   K 4.3 06/17/2017   CL 106 06/17/2017   CREATININE 1.71 (H) 06/17/2017   BUN 33 (H) 06/17/2017   CO2 27 06/17/2017   TSH 1.34 09/24/2015   PSA 15.38 (H) 09/29/2012   INR 0.96 04/14/2017   HGBA1C 7.2 (H) 06/17/2017    Mr Lumbar Spine W Wo Contrast  Result Date: 08/15/2017 CLINICAL DATA:  Chronic low back pain left knee pain and left foot drop. Knee surgery 2 years ago. Lumbar spine surgery November 2017. Creatinine was obtained on site at [address] at [address] Results: Creatinine 1.7 mg/dL. Calculated GFR is 43. Half dose contrast was  utilized. EXAM: MRI LUMBAR SPINE WITHOUT AND WITH CONTRAST TECHNIQUE: Multiplanar and multiecho pulse sequences of the lumbar spine were obtained without and with intravenous contrast. CONTRAST:  60mL MULTIHANCE GADOBENATE DIMEGLUMINE 529 MG/ML IV SOLN COMPARISON:  MRI of the lumbar spine 01/20/2016 FINDINGS: Segmentation: 5 non rib-bearing lumbar type vertebral bodies are present. Alignment: Grade 1 anterolisthesis at L4-5 is stable. AP alignment is otherwise anatomic. Levoconvex curvature is again noted at L3-4. Vertebrae:  Marrow signal and vertebral body heights are normal. Conus medullaris: Extends to the L1 level and appears normal. Paraspinal and other soft tissues: Multiple bilateral benign-appearing renal cysts are noted bilaterally. A T1 hyperintense cyst in the posteromedial left kidney is stable as well. Disc levels: T12-L1:  Negative. L1-2:  Negative. L2-3: Is a broad-based disc protrusion and bilateral facet hypertrophy is  stable. Mild subarticular and foraminal narrowing bilaterally is unchanged. L3-4: A left laminectomy is noted. Moderate facet hypertrophy has progressed. There is decompression of the central canal posteriorly on the left. Mild subarticular narrowing remains. The foramina are patent. L4-5: Laminectomy is noted. Moderate facet hypertrophy and spurring is again seen bilaterally. The posterior central canal is decompressed. Moderate subarticular stenosis remains, left greater than right. Mild foraminal narrowing is unchanged. L5-S1: Moderate facet hypertrophy is again seen. Disc signal is preserved. Mild left subarticular narrowing is stable. No other focal stenosis is present. IMPRESSION: 1. Left laminectomy at L3-4 with decompression of the central canal. 2. Mild residual subarticular stenosis bilaterally at L3-4. 3. Laminectomy L4-5 with decompression central canal. 4. Facet spurring results in residual moderate subarticular stenosis bilaterally, worse on the left 5. Mild foraminal  narrowing bilaterally at L4-5 is stable. 6. Stable moderate facet hypertrophy bilaterally with unchanged mild left subarticular stenosis. Electronically Signed   By: San Morelle M.D.   On: 08/15/2017 15:24    Assessment & Plan:   There are no diagnoses linked to this encounter. I have changed Edward [name]'s olmesartan, folic acid, niacin, triamterene-hydrochlorothiazide, Vitamin D (Ergocalciferol), metFORMIN, and apixaban. I am also having him maintain his CENTRUM SILVER, Vitamin D-3, pyridOXINE, vitamin B-12, aspirin EC, latanoprost, brimonidine, furosemide, and traMADol-acetaminophen. We will continue to administer furosemide.  Meds ordered this encounter  Medications  . olmesartan (BENICAR) 40 MG tablet    Sig: Take 1 tablet (40 mg total) daily by mouth.    Dispense:  90 tablet    Refill:  3  . folic acid (FOLVITE) 1 MG tablet    Sig: Take 1 tablet (1 mg total) daily by mouth.    Dispense:  90 tablet    Refill:  3  . niacin (NIASPAN) 500 MG CR tablet    Sig: Take 1 tablet (500 mg total) daily by mouth.    Dispense:  90 tablet    Refill:  3  . triamterene-hydrochlorothiazide (DYAZIDE) 37.5-25 MG capsule    Sig: Take 1 each (1 capsule total) every morning by mouth.    Dispense:  90 capsule    Refill:  3  . Vitamin D, Ergocalciferol, (DRISDOL) 50000 units CAPS capsule    Sig: Take 1 capsule (50,000 Units total) every 30 (thirty) days by mouth. First of the month    Dispense:  3 capsule    Refill:  3  . metFORMIN (GLUCOPHAGE) 500 MG tablet    Sig: Take 1 tablet (500 mg total) 2 (two) times daily with a meal by mouth.    Dispense:  180 tablet    Refill:  3  . apixaban (ELIQUIS) 5 MG TABS tablet    Sig: Take 1 tablet (5 mg total) 2 (two) times daily by mouth.    Dispense:  180 tablet    Refill:  3     Follow-up: No Follow-up on file.  Walker Kehr, MD

## 2017-10-24 NOTE — Assessment & Plan Note (Addendum)
  L foot drop - AFO Pt declined PT/OT

## 2017-10-24 NOTE — Assessment & Plan Note (Addendum)
F/u w/Dr Saintclair Halsted if worse L AFO Pt declined OT/PT

## 2017-10-24 NOTE — Assessment & Plan Note (Signed)
  L foot drop - AFO

## 2017-10-24 NOTE — Assessment & Plan Note (Addendum)
Metformin Monitor labs

## 2017-10-25 NOTE — Addendum Note (Signed)
Addended by: Karren Cobble on: 10/25/2017 01:45 PM   Modules accepted: Orders

## 2017-11-24 DIAGNOSIS — M5416 Radiculopathy, lumbar region: Secondary | ICD-10-CM | POA: Diagnosis not present

## 2017-11-24 DIAGNOSIS — M4807 Spinal stenosis, lumbosacral region: Secondary | ICD-10-CM | POA: Diagnosis not present

## 2017-11-24 DIAGNOSIS — M4316 Spondylolisthesis, lumbar region: Secondary | ICD-10-CM | POA: Diagnosis not present

## 2018-01-17 NOTE — Progress Notes (Signed)
faxed

## 2018-01-25 ENCOUNTER — Encounter: Payer: Self-pay | Admitting: Internal Medicine

## 2018-01-25 ENCOUNTER — Other Ambulatory Visit (INDEPENDENT_AMBULATORY_CARE_PROVIDER_SITE_OTHER): Payer: Medicare Other

## 2018-01-25 ENCOUNTER — Ambulatory Visit (INDEPENDENT_AMBULATORY_CARE_PROVIDER_SITE_OTHER): Payer: Medicare Other | Admitting: Internal Medicine

## 2018-01-25 DIAGNOSIS — I82412 Acute embolism and thrombosis of left femoral vein: Secondary | ICD-10-CM | POA: Diagnosis not present

## 2018-01-25 DIAGNOSIS — M48062 Spinal stenosis, lumbar region with neurogenic claudication: Secondary | ICD-10-CM | POA: Diagnosis not present

## 2018-01-25 DIAGNOSIS — K5900 Constipation, unspecified: Secondary | ICD-10-CM

## 2018-01-25 DIAGNOSIS — E119 Type 2 diabetes mellitus without complications: Secondary | ICD-10-CM

## 2018-01-25 DIAGNOSIS — N183 Chronic kidney disease, stage 3 unspecified: Secondary | ICD-10-CM

## 2018-01-25 DIAGNOSIS — E1122 Type 2 diabetes mellitus with diabetic chronic kidney disease: Secondary | ICD-10-CM | POA: Diagnosis not present

## 2018-01-25 DIAGNOSIS — I10 Essential (primary) hypertension: Secondary | ICD-10-CM

## 2018-01-25 LAB — BASIC METABOLIC PANEL
BUN: 26 mg/dL — AB (ref 6–23)
CO2: 31 mEq/L (ref 19–32)
Calcium: 9.1 mg/dL (ref 8.4–10.5)
Chloride: 103 mEq/L (ref 96–112)
Creatinine, Ser: 1.74 mg/dL — ABNORMAL HIGH (ref 0.40–1.50)
GFR: 48.47 mL/min — AB (ref >60.00)
Glucose, Bld: 150 mg/dL — ABNORMAL HIGH (ref 70–99)
POTASSIUM: 4.6 meq/L (ref 3.5–5.1)
SODIUM: 142 meq/L (ref 135–145)

## 2018-01-25 LAB — HEMOGLOBIN A1C: HEMOGLOBIN A1C: 6.4 % (ref 4.6–6.5)

## 2018-01-25 LAB — TSH: TSH: 2.54 u[IU]/mL (ref 0.35–4.50)

## 2018-01-25 MED ORDER — LINACLOTIDE 290 MCG PO CAPS: 290.0000 ug | ORAL_CAPSULE | Freq: Every day | ORAL | 3 refills | Status: DC

## 2018-01-25 NOTE — Assessment & Plan Note (Signed)
Metformin 

## 2018-01-25 NOTE — Assessment & Plan Note (Signed)
Labs

## 2018-01-25 NOTE — Assessment & Plan Note (Signed)
[facility]

## 2018-01-25 NOTE — Assessment & Plan Note (Signed)
On Benicar 

## 2018-01-25 NOTE — Progress Notes (Signed)
Subjective:  Patient ID: Edward [name], male    DOB: [birth year]  Age: 84 y.o. MRN: [id number]  CC: No chief complaint on file.   HPI Edward [name] presents for DVT, LBP- better; leg weakness C/o constipation: OTC meds did not work.  Outpatient Medications Prior to Visit  Medication Sig Dispense Refill  . apixaban (ELIQUIS) 5 MG TABS tablet Take 1 tablet (5 mg total) 2 (two) times daily by mouth. 180 tablet 3  . aspirin EC 81 MG tablet Take 81 mg by mouth daily.    . brimonidine (ALPHAGAN P) 0.1 % SOLN Place 1 drop into both eyes 2 (two) times daily.    . Cholecalciferol (VITAMIN D-3) 1000 UNITS CAPS Take 2,000 Units by mouth every morning.     . folic acid (FOLVITE) 1 MG tablet Take 1 tablet (1 mg total) daily by mouth. 90 tablet 3  . furosemide (LASIX) 20 MG tablet Take 20-40 mgs by mouth 3 times per week as needed for leg swelling 90 tablet 3  . gabapentin (NEURONTIN) 300 MG capsule Take 300 mg by mouth at bedtime.    Marland Kitchen latanoprost (XALATAN) 0.005 % ophthalmic solution Place 1 drop into both eyes at bedtime.    . metFORMIN (GLUCOPHAGE) 500 MG tablet Take 1 tablet (500 mg total) 2 (two) times daily with a meal by mouth. 180 tablet 3  . Multiple Vitamins-Minerals (CENTRUM SILVER) tablet Take 1 tablet by mouth daily. Reported on 05/25/2016    . niacin (NIASPAN) 500 MG CR tablet Take 1 tablet (500 mg total) daily by mouth. 90 tablet 3  . olmesartan (BENICAR) 40 MG tablet Take 1 tablet (40 mg total) daily by mouth. 90 tablet 3  . pyridOXINE (VITAMIN B-6) 100 MG tablet Take 200 mg by mouth daily.    . vitamin B-12 (CYANOCOBALAMIN) 1000 MCG tablet Take 1,000 mcg by mouth daily.    . Vitamin D, Ergocalciferol, (DRISDOL) 50000 units CAPS capsule Take 1 capsule (50,000 Units total) every 30 (thirty) days by mouth. First of the month 3 capsule 3  . traMADol-acetaminophen (ULTRACET) 37.5-325 MG tablet Take 0.5-1 tablets by mouth every 8 (eight) hours as needed for moderate pain or severe pain.  60 tablet 2   Facility-Administered Medications Prior to Visit  Medication Dose Route Frequency Provider Last Rate Last Dose  . furosemide (LASIX) tablet 20 mg  20 mg Oral Once Lyndal Pulley, DO        ROS Review of Systems  Constitutional: Positive for fatigue. Negative for appetite change and unexpected weight change.  HENT: Negative for congestion, nosebleeds, sneezing, sore throat and trouble swallowing.   Eyes: Negative for itching and visual disturbance.  Respiratory: Negative for cough.   Cardiovascular: Positive for leg swelling. Negative for chest pain and palpitations.  Gastrointestinal: Positive for constipation. Negative for abdominal distention, blood in stool, diarrhea and nausea.  Genitourinary: Negative for frequency and hematuria.  Musculoskeletal: Positive for arthralgias and gait problem. Negative for back pain, joint swelling and neck pain.  Skin: Negative for rash.  Neurological: Positive for weakness. Negative for dizziness, tremors and speech difficulty.  Psychiatric/Behavioral: Negative for agitation, dysphoric mood, sleep disturbance and suicidal ideas. The patient is not nervous/anxious.     Objective:  BP 124/70 (BP Location: Left Arm, Patient Position: Sitting, Cuff Size: Large)   Pulse 68   Temp 97.7 F (36.5 C) (Oral)   Ht 6\' 1"  (1.854 m)   Wt 212 lb (96.2 kg)   SpO2 98%  BMI 27.97 kg/m   BP Readings from Last 3 Encounters:  01/25/18 124/70  10/24/17 124/66  07/21/17 112/68    Wt Readings from Last 3 Encounters:  01/25/18 212 lb (96.2 kg)  10/24/17 208 lb (94.3 kg)  07/21/17 204 lb (92.5 kg)    Physical Exam  Constitutional: He is oriented to person, place, and time. He appears well-developed. No distress.  NAD  HENT:  Mouth/Throat: Oropharynx is clear and moist.  Eyes: Conjunctivae are normal. Pupils are equal, round, and reactive to light.  Neck: Normal range of motion. No JVD present. No thyromegaly present.  Cardiovascular:  Normal rate, regular rhythm, normal heart sounds and intact distal pulses. Exam reveals no gallop and no friction rub.  No murmur heard. Pulmonary/Chest: Effort normal and breath sounds normal. No respiratory distress. He has no wheezes. He has no rales. He exhibits no tenderness.  Abdominal: Soft. Bowel sounds are normal. He exhibits no distension and no mass. There is no tenderness. There is no rebound and no guarding.  Musculoskeletal: Normal range of motion. He exhibits no edema or tenderness.  Lymphadenopathy:    He has no cervical adenopathy.  Neurological: He is alert and oriented to person, place, and time. He has normal reflexes. No cranial nerve deficit. He exhibits normal muscle tone. He displays a negative Romberg sign. Coordination abnormal. Gait normal.  Skin: Skin is warm and dry. No rash noted.  Psychiatric: He has a normal mood and affect. His behavior is normal. Judgment and thought content normal.  using a walker; weak LEs  Lab Results  Component Value Date   WBC 5.0 04/14/2017   HGB 11.9 (L) 04/14/2017   HCT 35.7 (L) 04/14/2017   PLT 175 04/14/2017   GLUCOSE 133 (H) 10/24/2017   CHOL 152 09/24/2015   TRIG 151.0 (H) 09/24/2015   HDL 34.10 (L) 09/24/2015   LDLCALC 88 09/24/2015   ALT 23 09/24/2015   AST 19 09/24/2015   NA 141 10/24/2017   K 5.1 10/24/2017   CL 102 10/24/2017   CREATININE 1.79 (H) 10/24/2017   BUN 32 (H) 10/24/2017   CO2 30 10/24/2017   TSH 1.34 09/24/2015   PSA 15.38 (H) 09/29/2012   INR 0.96 04/14/2017   HGBA1C 6.2 10/24/2017    Mr Lumbar Spine W Wo Contrast  Result Date: 08/15/2017 CLINICAL DATA:  Chronic low back pain left knee pain and left foot drop. Knee surgery 2 years ago. Lumbar spine surgery November 2017. Creatinine was obtained on site at [facility] at [address] Results: Creatinine 1.7 mg/dL. Calculated GFR is 43. Half dose contrast was utilized. EXAM: MRI LUMBAR SPINE WITHOUT AND WITH CONTRAST TECHNIQUE:  Multiplanar and multiecho pulse sequences of the lumbar spine were obtained without and with intravenous contrast. CONTRAST:  37mL MULTIHANCE GADOBENATE DIMEGLUMINE 529 MG/ML IV SOLN COMPARISON:  MRI of the lumbar spine 01/20/2016 FINDINGS: Segmentation: 5 non rib-bearing lumbar type vertebral bodies are present. Alignment: Grade 1 anterolisthesis at L4-5 is stable. AP alignment is otherwise anatomic. Levoconvex curvature is again noted at L3-4. Vertebrae:  Marrow signal and vertebral body heights are normal. Conus medullaris: Extends to the L1 level and appears normal. Paraspinal and other soft tissues: Multiple bilateral benign-appearing renal cysts are noted bilaterally. A T1 hyperintense cyst in the posteromedial left kidney is stable as well. Disc levels: T12-L1:  Negative. L1-2:  Negative. L2-3: Is a broad-based disc protrusion and bilateral facet hypertrophy is stable. Mild subarticular and foraminal narrowing bilaterally is unchanged. L3-4:  A left laminectomy is noted. Moderate facet hypertrophy has progressed. There is decompression of the central canal posteriorly on the left. Mild subarticular narrowing remains. The foramina are patent. L4-5: Laminectomy is noted. Moderate facet hypertrophy and spurring is again seen bilaterally. The posterior central canal is decompressed. Moderate subarticular stenosis remains, left greater than right. Mild foraminal narrowing is unchanged. L5-S1: Moderate facet hypertrophy is again seen. Disc signal is preserved. Mild left subarticular narrowing is stable. No other focal stenosis is present. IMPRESSION: 1. Left laminectomy at L3-4 with decompression of the central canal. 2. Mild residual subarticular stenosis bilaterally at L3-4. 3. Laminectomy L4-5 with decompression central canal. 4. Facet spurring results in residual moderate subarticular stenosis bilaterally, worse on the left 5. Mild foraminal narrowing bilaterally at L4-5 is stable. 6. Stable moderate facet  hypertrophy bilaterally with unchanged mild left subarticular stenosis. Electronically Signed   By: San Morelle M.D.   On: 08/15/2017 15:24    Assessment & Plan:   There are no diagnoses linked to this encounter. I have discontinued Iona Beard T. Mullings's traMADol-acetaminophen. I am also having him maintain his CENTRUM SILVER, Vitamin D-3, pyridOXINE, vitamin B-12, aspirin EC, latanoprost, brimonidine, furosemide, olmesartan, folic acid, niacin, Vitamin D (Ergocalciferol), metFORMIN, apixaban, and gabapentin. We will continue to administer furosemide.  No orders of the defined types were placed in this encounter.    Follow-up: No Follow-up on file.  Walker Kehr, MD

## 2018-01-25 NOTE — Assessment & Plan Note (Signed)
On Eliquis

## 2018-01-25 NOTE — Assessment & Plan Note (Signed)
Edward [name]

## 2018-02-07 DIAGNOSIS — C61 Malignant neoplasm of prostate: Secondary | ICD-10-CM | POA: Diagnosis not present

## 2018-02-13 DIAGNOSIS — C61 Malignant neoplasm of prostate: Secondary | ICD-10-CM | POA: Diagnosis not present

## 2018-02-13 DIAGNOSIS — R351 Nocturia: Secondary | ICD-10-CM | POA: Diagnosis not present

## 2018-02-13 DIAGNOSIS — M545 Low back pain: Secondary | ICD-10-CM | POA: Diagnosis not present

## 2018-02-23 DIAGNOSIS — M4807 Spinal stenosis, lumbosacral region: Secondary | ICD-10-CM | POA: Diagnosis not present

## 2018-02-23 DIAGNOSIS — R270 Ataxia, unspecified: Secondary | ICD-10-CM | POA: Diagnosis not present

## 2018-02-24 ENCOUNTER — Other Ambulatory Visit: Payer: Self-pay | Admitting: Neurosurgery

## 2018-02-24 DIAGNOSIS — R270 Ataxia, unspecified: Secondary | ICD-10-CM

## 2018-03-06 ENCOUNTER — Ambulatory Visit
Admission: RE | Admit: 2018-03-06 | Discharge: 2018-03-06 | Disposition: A | Discharge status: Home / Self Care | Payer: Medicare Other | Source: Ambulatory Visit | Attending: Neurosurgery | Admitting: Neurosurgery

## 2018-03-06 DIAGNOSIS — M4802 Spinal stenosis, cervical region: Secondary | ICD-10-CM | POA: Diagnosis not present

## 2018-03-06 DIAGNOSIS — R270 Ataxia, unspecified: Secondary | ICD-10-CM

## 2018-03-06 DIAGNOSIS — M5124 Other intervertebral disc displacement, thoracic region: Secondary | ICD-10-CM | POA: Diagnosis not present

## 2018-03-08 ENCOUNTER — Ambulatory Visit
Admission: RE | Admit: 2018-03-08 | Discharge: 2018-03-08 | Disposition: A | Discharge status: Home / Self Care | Payer: Medicare Other | Source: Ambulatory Visit | Attending: Neurosurgery | Admitting: Neurosurgery

## 2018-03-08 DIAGNOSIS — M5126 Other intervertebral disc displacement, lumbar region: Secondary | ICD-10-CM | POA: Diagnosis not present

## 2018-03-08 DIAGNOSIS — R270 Ataxia, unspecified: Secondary | ICD-10-CM

## 2018-03-08 MED ORDER — GADOBENATE DIMEGLUMINE 529 MG/ML IV SOLN: 20.0000 mL | Freq: Once | INTRAVENOUS | Status: DC | PRN

## 2018-03-08 MED ORDER — GADOBENATE DIMEGLUMINE 529 MG/ML IV SOLN
10.0000 mL | Freq: Once | INTRAVENOUS | Status: AC | PRN
  Administered 2018-03-08: 10 mL via INTRAVENOUS

## 2018-03-13 DIAGNOSIS — M4807 Spinal stenosis, lumbosacral region: Secondary | ICD-10-CM | POA: Diagnosis not present

## 2018-03-21 DIAGNOSIS — M4807 Spinal stenosis, lumbosacral region: Secondary | ICD-10-CM | POA: Diagnosis not present

## 2018-03-28 ENCOUNTER — Encounter: Payer: Self-pay | Admitting: Neurology

## 2018-03-28 DIAGNOSIS — R258 Other abnormal involuntary movements: Secondary | ICD-10-CM | POA: Diagnosis not present

## 2018-03-28 DIAGNOSIS — M5440 Lumbago with sciatica, unspecified side: Secondary | ICD-10-CM | POA: Diagnosis not present

## 2018-03-30 ENCOUNTER — Encounter: Payer: Self-pay | Admitting: Neurology

## 2018-03-30 ENCOUNTER — Ambulatory Visit (INDEPENDENT_AMBULATORY_CARE_PROVIDER_SITE_OTHER): Payer: Medicare Other | Admitting: Neurology

## 2018-03-30 ENCOUNTER — Other Ambulatory Visit: Payer: Medicare Other

## 2018-03-30 VITALS — BP 102/68 | HR 72 | Ht 73.0 in | Wt 207.0 lb

## 2018-03-30 DIAGNOSIS — H02403 Unspecified ptosis of bilateral eyelids: Secondary | ICD-10-CM | POA: Diagnosis not present

## 2018-03-30 DIAGNOSIS — R2681 Unsteadiness on feet: Secondary | ICD-10-CM

## 2018-03-30 DIAGNOSIS — R258 Other abnormal involuntary movements: Secondary | ICD-10-CM

## 2018-03-30 DIAGNOSIS — R531 Weakness: Secondary | ICD-10-CM

## 2018-03-30 DIAGNOSIS — R251 Tremor, unspecified: Secondary | ICD-10-CM | POA: Diagnosis not present

## 2018-03-30 NOTE — Patient Instructions (Signed)
1. Your provider has requested that you have labwork completed today. Please go to [facility] ([phone]) on the second floor of this building before leaving the office today. You do not need to check in. If you are not called within 15 minutes please check with the front desk.   2. We have sent a referral to [facility] for your DAT scan and they will call you directly to schedule your appt.. They are located at [address] If you need to contact them directly please call [phone].

## 2018-03-30 NOTE — Progress Notes (Signed)
Edward [name] was seen today in the movement disorders clinic for neurologic consultation at the request of Kary Kos, MD.  The consultation is for the evaluation of bradykinesia.  The records that were made available to me were reviewed. This patient is accompanied in the office by his spouse who supplements the history. Pt saw Dr. Saintclair Halsted to see if walking issues were spine related and they were felt not to be.  Dr. Saintclair Halsted felt that he was slow and had early parkinsonism and sent here for evaluation of such.  Specific Symptoms:  Tremor: Yes.  , minor when holds out the hands Family hx of similar:  No., brother has drop foot Voice: softer than in the past per wife Sleep: sleeps well  Vivid Dreams:  No.  Acting out dreams:  No. Wet Pillows: No. Postural symptoms:  Yes.    Falls?  Yes.  , "I feel like I want to fall backward all the time."  Did fall last week when trying to get walker out of the car and fell backward.  Didn't get hurt.  Wife describes other "minor falls" back into the chair Bradykinesia symptoms: shuffling gait, slow movements, difficulty getting out of a chair and drags the L leg (pt attributes to knee issue - tore patellar tendons in 2013 and had to have surgery and not right after) Loss of smell:  No. Loss of taste:  No. Urinary Incontinence:  No. Difficulty Swallowing:  No. Handwriting, micrographia: "always been bad" Trouble with ADL's:  Yes.  , has to manually pick up L leg to get leg into pants.  Wife does ADL's.  Sits in shower and wife helps him.    Trouble buttoning clothing: No. Depression:  Pt denies but is frustrated with physical limitations Memory changes:  No. Hallucinations:  No.  visual distortions: No. N/V:  No. Lightheaded:  No.  Syncope: No. Diplopia:  No. Dyskinesia:  No.  Neuroimaging of the brain has not previously been performed.  Patient has had MRI of the lumbar, thoracic and cervical spine.  This was done in March, 2019.  MRI of the  lumbar spine demonstrated left laminectomy at L3-L4 and L4-L5. Mri of the thoracic spine was essentially unremarkable.  MRI of the cervical spine with moderate neuroforaminal stenosis at C6-C7 levels, severe facet arthrosis at C7-T1.  There is multilevel degenerative changes.  PREVIOUS MEDICATIONS: none to date  ALLERGIES:   Allergies  Allergen Reactions  . Naproxen Sodium Other (See Comments)    low platelets    CURRENT MEDICATIONS:  Outpatient Encounter Medications as of 03/30/2018  Medication Sig  . apixaban (ELIQUIS) 5 MG TABS tablet Take 1 tablet (5 mg total) 2 (two) times daily by mouth.  Marland Kitchen aspirin EC 81 MG tablet Take 81 mg by mouth daily.  . brimonidine (ALPHAGAN P) 0.1 % SOLN Place 1 drop into both eyes 2 (two) times daily.  . Cholecalciferol (VITAMIN D-3) 1000 UNITS CAPS Take 2,000 Units by mouth every morning.   . folic acid (FOLVITE) 1 MG tablet Take 1 tablet (1 mg total) daily by mouth.  . furosemide (LASIX) 20 MG tablet Take 20-40 mgs by mouth 3 times per week as needed for leg swelling  . gabapentin (NEURONTIN) 300 MG capsule Take 300 mg by mouth at bedtime.  Marland Kitchen latanoprost (XALATAN) 0.005 % ophthalmic solution Place 1 drop into both eyes at bedtime.  . metFORMIN (GLUCOPHAGE) 500 MG tablet Take 1 tablet (500 mg total) 2 (two) times daily  with a meal by mouth.  . Multiple Vitamins-Minerals (CENTRUM SILVER) tablet Take 1 tablet by mouth daily. Reported on 05/25/2016  . niacin (NIASPAN) 500 MG CR tablet Take 1 tablet (500 mg total) daily by mouth.  . olmesartan (BENICAR) 40 MG tablet Take 1 tablet (40 mg total) daily by mouth.  . pyridOXINE (VITAMIN B-6) 100 MG tablet Take 200 mg by mouth daily.  . vitamin B-12 (CYANOCOBALAMIN) 1000 MCG tablet Take 1,000 mcg by mouth daily.  . Vitamin D, Ergocalciferol, (DRISDOL) 50000 units CAPS capsule Take 1 capsule (50,000 Units total) every 30 (thirty) days by mouth. First of the month  . [DISCONTINUED] linaclotide (LINZESS) 290 MCG CAPS  capsule Take 1 capsule (290 mcg total) by mouth daily.  . [DISCONTINUED] furosemide (LASIX) tablet 20 mg    No facility-administered encounter medications on file as of 03/30/2018.     PAST MEDICAL HISTORY:   Past Medical History:  Diagnosis Date  . Back pain    Difficulty walking- left leg pain  . Chronic renal insufficiency    DR WEBB  . Complex renal cyst RIGHT--  MONITORED BY DR Risa Grill  . Crush injury, back   . Drug-induced low platelet count    from NSAIDS  . DVT (deep venous thrombosis) ([place])   . Elevated glucose   . Hearing loss   . History of swelling of feet   . Hyperlipidemia   . Hypertension   . Nocturia   . Prostate cancer ([place])   . Right knee injury    In past  . S/P radiation therapy 06/25/2015 through 08/19/2015   Prostate 7800 cGy in 40 sessions, seminal vesicles 5600 cGy in 40 sessions   . Venous insufficiency of leg   . Wears dentures    partial  . Wears glasses     PAST SURGICAL HISTORY:   Past Surgical History:  Procedure Laterality Date  . COLONOSCOPY  2/77/11  . CRYOABLATION  12/01/2012   Procedure: CRYO ABLATION PROSTATE;  Surgeon: Hanley Ben, MD;  Location: [facility];  Service: Urology;  Laterality: N/A;  . LUMBAR LAMINECTOMY/DECOMPRESSION MICRODISCECTOMY Left 10/28/2016   Procedure: Lumbar Laminectomy/decompression Microdiscectomy lumbar three- lumbar four, lumbar four to lumbar five, Left;  Surgeon: Kary Kos, MD;  Location: [facility];  Service: Neurosurgery;  Laterality: Left;  Lumbar Laminectomy/decompression Microdiscectomy lumbar three- lumbar four, lumbar four to lumbar five, Left  . QUADRICEPS TENDON REPAIR Left 10/31/2013   Procedure: Left Quadriceps Tendon Repair;  Surgeon: Marybelle Killings, MD;  Location: [facility];  Service: Orthopedics;  Laterality: Left;  Left Quadriceps Tendon Repair  . REPAIR KNEE LIGAMENT Left 2014    SOCIAL HISTORY:   Social History   Socioeconomic History  . Marital status:  Married    Spouse name: Not on file  . Number of children: 2  . Years of education: Not on file  . Highest education level: Not on file  Occupational History  . Occupation: retired    Comment: Office manager  Social Needs  . Financial resource strain: Not on file  . Food insecurity:    Worry: Not on file    Inability: Not on file  . Transportation needs:    Medical: Not on file    Non-medical: Not on file  Tobacco Use  . Smoking status: Never Smoker  . Smokeless tobacco: Never Used  Substance and Sexual Activity  . Alcohol use: No  . Drug use: No  . Sexual activity: Yes  Lifestyle  .  Physical activity:    Days per week: Not on file    Minutes per session: Not on file  . Stress: Not on file  Relationships  . Social connections:    Talks on phone: Not on file    Gets together: Not on file    Attends religious service: Not on file    Active member of club or organization: Not on file    Attends meetings of clubs or organizations: Not on file    Relationship status: Not on file  . Intimate partner violence:    Fear of current or ex partner: Not on file    Emotionally abused: Not on file    Physically abused: Not on file    Forced sexual activity: Not on file  Other Topics Concern  . Not on file  Social History Narrative  . Not on file    FAMILY HISTORY:   Family Status  Relation Name Status  . Mother 24 Deceased  . Father  Deceased  . Brother  Alive  . Brother  Alive  . Sister 1 Alive  . Sister 1 Deceased  . Brother  Alive  . Son 2 Alive    ROS:  Some DOE.  A complete 10 system review of systems was obtained and was unremarkable apart from what is mentioned above.  PHYSICAL EXAMINATION:    VITALS:   Vitals:   03/30/18 1317  BP: 102/68  Pulse: 72  SpO2: 95%  Weight: 207 lb (93.9 kg)  Height: 6\' 1"  (1.854 m)    GEN:  The patient appears stated age and is in NAD. HEENT:  Normocephalic, atraumatic.  The mucous membranes are moist. The superficial  temporal arteries are without ropiness or tenderness. CV:  RRR Lungs:  CTAB Neck/HEME:  There are no carotid bruits bilaterally.  Neurological examination:  Orientation: The patient is alert and oriented x3. Fund of knowledge is appropriate.  Recent and remote memory are intact.  Attention and concentration are normal.    Able to name objects and repeat phrases. Cranial nerves: There is good facial symmetry.  there is facial hypomimia.  There is ptosis/pseudoptosis from lid lag.   Pupils are equal round and reactive to light bilaterally. Fundoscopic exam reveals clear margins bilaterally. Extraocular muscles are intact. The visual fields are full to confrontational testing. The speech is fluent and clear.  He is somewhat hypophonic.  Soft palate rises symmetrically and there is no tongue deviation. Hearing is intact to conversational tone. Sensation: Sensation is intact to light and pinprick throughout (facial, trunk, extremities). Vibration is intact at the bilateral big toe but decreased. There is no extinction with double simultaneous stimulation. There is no sensory dermatomal level identified. Motor: Strength is 5/5 in the RUE and lower extremities.   Strength is 5/5 in the LUE and at least 4/5 in the proximal LLE (hip flexors) and 5/5 in the distal lower extremity.  Shoulder shrug is equal and symmetric.  There is no pronator drift. Deep tendon reflexes: Deep tendon reflexes are 2/4 at the bilateral biceps, triceps, brachioradialis, absent at the bilateral patella and achilles. Plantar responses are downgoing bilaterally.  Movement examination: Tone: pt has trouble relaxing but appears good tone bilaterally Abnormal movements: slight tremor can be felt with distraction procedures but not seen in the bilateral upper extremities, left more than right. Coordination:  There is no decremation with RAM's, with any form of RAMS, including alternating supination and pronation of the forearm, hand  opening and closing,  finger taps, heel taps and toe taps. Gait and Station: The patient has difficulty arising out of a deep-seated chair without the use of the hands and is unable to do this.  There is foot drop on the L.   He ambulates with his walker, reporting that he really cannot do it without the walker.  He has very bent knees, particularly on the left.  He is unstable.  Gait is somewhat antalgic (patient reports it has been this way since 2013 since tendon repair around the patella).  Labs:    Chemistry      Component Value Date/Time   NA 142 01/25/2018 0912   K 4.6 01/25/2018 0912   CL 103 01/25/2018 0912   CO2 31 01/25/2018 0912   BUN 26 (H) 01/25/2018 0912   CREATININE 1.74 (H) 01/25/2018 0912      Component Value Date/Time   CALCIUM 9.1 01/25/2018 0912   ALKPHOS 35 (L) 09/24/2015 0906   AST 19 09/24/2015 0906   ALT 23 09/24/2015 0906   BILITOT 0.6 09/24/2015 0906     Lab Results  Component Value Date   TSH 2.54 01/25/2018     ASSESSMENT/PLAN:  1.  Bradykinesia  -I agree with Dr. Saintclair Halsted this patient is bradykinetic, but he does not meet criteria for idiopathic Parkinson's disease.  While an atypical state is definitely on my list of differential diagnoses, I think other things need to be ruled out.  His examination is made very difficult by the injury to the left leg with subsequent left foot drop.  I recommended a DaT scan and he was agreeable.  -We will do lab work, including acetylcholine receptor antibodies.  He does have ptosis, but it is likely pseudoptosis from lid lag.  -I recommended EMG testing, but he refused that for now.  He stated he would think about it.  -If the above is all negative, then I may consider neuroimaging of the brain as well.  2.  F/u after above testing completed.  In meatime, pt will continue in PT.    Cc:  Plotnikov, Evie Lacks, MD

## 2018-04-08 LAB — TIQ-NTM

## 2018-04-08 LAB — MYASTHENIA GRAVIS PANEL 2
ACETYLCHOL MODUL AB: 15 %{inhibition}
ACHR Blocking Abs: <15 % Inhibition (ref <15)

## 2018-04-08 LAB — ALDOLASE: ALDOLASE: 3.9 U/L (ref <=8.1)

## 2018-04-08 LAB — CK: CK TOTAL: 156 U/L (ref 44–196)

## 2018-04-10 ENCOUNTER — Telehealth: Payer: Self-pay | Admitting: Neurology

## 2018-04-10 NOTE — Telephone Encounter (Signed)
Patient made aware labs okay. 

## 2018-04-10 NOTE — Telephone Encounter (Signed)
-----   Message from Alda Berthold, DO sent at 04/10/2018 10:09 AM EDT ----- Please notify patient lab are within normal limits.  Thank you.

## 2018-04-14 ENCOUNTER — Ambulatory Visit: Payer: Medicare Other | Admitting: Neurology

## 2018-04-20 ENCOUNTER — Encounter (HOSPITAL_COMMUNITY)
Admission: RE | Admit: 2018-04-20 | Discharge: 2018-04-20 | Disposition: A | Discharge status: Home / Self Care | Payer: Medicare Other | Source: Ambulatory Visit | Attending: Neurology | Admitting: Neurology

## 2018-04-20 DIAGNOSIS — R251 Tremor, unspecified: Secondary | ICD-10-CM

## 2018-04-20 DIAGNOSIS — R269 Unspecified abnormalities of gait and mobility: Secondary | ICD-10-CM | POA: Diagnosis not present

## 2018-04-20 DIAGNOSIS — R531 Weakness: Secondary | ICD-10-CM | POA: Diagnosis not present

## 2018-04-20 MED ORDER — IOFLUPANE I 123 185 MBQ/2.5ML IV SOLN
4.8000 | Freq: Once | INTRAVENOUS | Status: AC
  Administered 2018-04-20: 4.8 via INTRAVENOUS

## 2018-04-20 MED ORDER — IODINE STRONG (LUGOLS) 5 % PO SOLN
0.8000 mL | Freq: Once | ORAL | Status: AC
  Administered 2018-04-20: 0.8 mL via ORAL
  Filled 2018-04-20: qty 0.8

## 2018-04-24 ENCOUNTER — Ambulatory Visit (INDEPENDENT_AMBULATORY_CARE_PROVIDER_SITE_OTHER): Payer: Medicare Other | Admitting: Internal Medicine

## 2018-04-24 ENCOUNTER — Encounter: Payer: Self-pay | Admitting: Internal Medicine

## 2018-04-24 DIAGNOSIS — E119 Type 2 diabetes mellitus without complications: Secondary | ICD-10-CM | POA: Diagnosis not present

## 2018-04-24 DIAGNOSIS — R269 Unspecified abnormalities of gait and mobility: Secondary | ICD-10-CM | POA: Diagnosis not present

## 2018-04-24 DIAGNOSIS — N183 Chronic kidney disease, stage 3 (moderate): Secondary | ICD-10-CM | POA: Diagnosis not present

## 2018-04-24 DIAGNOSIS — E1122 Type 2 diabetes mellitus with diabetic chronic kidney disease: Secondary | ICD-10-CM | POA: Diagnosis not present

## 2018-04-24 MED ORDER — FUROSEMIDE 20 MG PO TABS: ORAL_TABLET | ORAL | 3 refills | Status: DC

## 2018-04-24 NOTE — Assessment & Plan Note (Signed)
F/u w/Dr Tat DAT scan IMPRESSION: Decreased radiotracer activity within the LEFT and RIGHT putamen and irregular activity irregular shape of the caudate heads is favored to represent a Parkinson's type syndrome pattern.   Electronically Signed   By: Suzy Bouchard M.D.   On: 04/20/2018 15:27

## 2018-04-24 NOTE — Assessment & Plan Note (Signed)
Metfpormin

## 2018-04-24 NOTE — Assessment & Plan Note (Signed)
Labs

## 2018-04-24 NOTE — Progress Notes (Signed)
Subjective:  Patient ID: Edward [name], male    DOB: 04-12-1933  Age: 84 y.o. MRN: [id number]  CC: No chief complaint on file.   HPI LEJON AFZAL presents for weakness - worse, FTT  Outpatient Medications Prior to Visit  Medication Sig Dispense Refill  . apixaban (ELIQUIS) 5 MG TABS tablet Take 1 tablet (5 mg total) 2 (two) times daily by mouth. 180 tablet 3  . aspirin EC 81 MG tablet Take 81 mg by mouth daily.    . brimonidine (ALPHAGAN P) 0.1 % SOLN Place 1 drop into both eyes 2 (two) times daily.    . Cholecalciferol (VITAMIN D-3) 1000 UNITS CAPS Take 2,000 Units by mouth every morning.     . folic acid (FOLVITE) 1 MG tablet Take 1 tablet (1 mg total) daily by mouth. 90 tablet 3  . gabapentin (NEURONTIN) 300 MG capsule Take 300 mg by mouth at bedtime.    Marland Kitchen latanoprost (XALATAN) 0.005 % ophthalmic solution Place 1 drop into both eyes at bedtime.    . metFORMIN (GLUCOPHAGE) 500 MG tablet Take 1 tablet (500 mg total) 2 (two) times daily with a meal by mouth. 180 tablet 3  . Multiple Vitamins-Minerals (CENTRUM SILVER) tablet Take 1 tablet by mouth daily. Reported on 05/25/2016    . niacin (NIASPAN) 500 MG CR tablet Take 1 tablet (500 mg total) daily by mouth. 90 tablet 3  . olmesartan (BENICAR) 40 MG tablet Take 1 tablet (40 mg total) daily by mouth. 90 tablet 3  . pyridOXINE (VITAMIN B-6) 100 MG tablet Take 200 mg by mouth daily.    . vitamin B-12 (CYANOCOBALAMIN) 1000 MCG tablet Take 1,000 mcg by mouth daily.    . Vitamin D, Ergocalciferol, (DRISDOL) 50000 units CAPS capsule Take 1 capsule (50,000 Units total) every 30 (thirty) days by mouth. First of the month 3 capsule 3  . furosemide (LASIX) 20 MG tablet Take 20-40 mgs by mouth 3 times per week as needed for leg swelling 90 tablet 3   No facility-administered medications prior to visit.     ROS Review of Systems  Constitutional: Positive for fatigue. Negative for appetite change and unexpected weight change.  HENT: Negative  for congestion, nosebleeds, sneezing, sore throat and trouble swallowing.   Eyes: Negative for itching and visual disturbance.  Respiratory: Negative for cough.   Cardiovascular: Negative for chest pain, palpitations and leg swelling.  Gastrointestinal: Negative for abdominal distention, blood in stool, diarrhea and nausea.  Genitourinary: Negative for frequency and hematuria.  Musculoskeletal: Positive for back pain and gait problem. Negative for joint swelling and neck pain.  Skin: Negative for rash.  Neurological: Positive for weakness. Negative for dizziness, tremors and speech difficulty.  Psychiatric/Behavioral: Positive for decreased concentration. Negative for agitation, dysphoric mood, sleep disturbance and suicidal ideas. The patient is not nervous/anxious.     Objective:  BP 128/72 (BP Location: Left Arm, Patient Position: Sitting, Cuff Size: Large)   Pulse 70   Temp (!) 97.4 F (36.3 C) (Oral)   Ht 6\' 1"  (1.854 m)   Wt 207 lb (93.9 kg)   SpO2 99%   BMI 27.31 kg/m   BP Readings from Last 3 Encounters:  04/24/18 128/72  03/30/18 102/68  01/25/18 124/70    Wt Readings from Last 3 Encounters:  04/24/18 207 lb (93.9 kg)  03/30/18 207 lb (93.9 kg)  01/25/18 212 lb (96.2 kg)    Physical Exam  Constitutional: He is oriented to person, place, and time.  He appears well-developed. No distress.  NAD  HENT:  Mouth/Throat: Oropharynx is clear and moist.  Eyes: Pupils are equal, round, and reactive to light. Conjunctivae are normal.  Neck: Normal range of motion. No JVD present. No thyromegaly present.  Cardiovascular: Normal rate, regular rhythm, normal heart sounds and intact distal pulses. Exam reveals no gallop and no friction rub.  No murmur heard. Pulmonary/Chest: Effort normal and breath sounds normal. No respiratory distress. He has no wheezes. He has no rales. He exhibits no tenderness.  Abdominal: Soft. Bowel sounds are normal. He exhibits no distension and no  mass. There is no tenderness. There is no rebound and no guarding.  Musculoskeletal: Normal range of motion. He exhibits no edema or tenderness.  Lymphadenopathy:    He has no cervical adenopathy.  Neurological: He is alert and oriented to person, place, and time. He displays abnormal reflex. No cranial nerve deficit. He exhibits abnormal muscle tone. He displays a negative Romberg sign. Coordination and gait normal.  Skin: Skin is warm and dry. No rash noted.  Psychiatric: He has a normal mood and affect. His behavior is normal. Judgment and thought content normal.   Walker Ataxic Weak LEs Apathy  Lab Results  Component Value Date   WBC 5.0 04/14/2017   HGB 11.9 (L) 04/14/2017   HCT 35.7 (L) 04/14/2017   PLT 175 04/14/2017   GLUCOSE 150 (H) 01/25/2018   CHOL 152 09/24/2015   TRIG 151.0 (H) 09/24/2015   HDL 34.10 (L) 09/24/2015   LDLCALC 88 09/24/2015   ALT 23 09/24/2015   AST 19 09/24/2015   NA 142 01/25/2018   K 4.6 01/25/2018   CL 103 01/25/2018   CREATININE 1.74 (H) 01/25/2018   BUN 26 (H) 01/25/2018   CO2 31 01/25/2018   TSH 2.54 01/25/2018   PSA 15.38 (H) 09/29/2012   INR 0.96 04/14/2017   HGBA1C 6.4 01/25/2018    Nm Brain Imaging W/spect (datscan)  Result Date: 04/20/2018 CLINICAL DATA:  Slight tremor of the hands. Difficulty holding objects. Poor gait. Weakness in the legs. EXAM: NUCLEAR MEDICINE BRAIN IMAGING WITH SPECT  (DaTscan ) TECHNIQUE: SPECT images of the brain were obtained after intravenous injection of radiopharmaceutical. 4 hour post injection imaging. Appropriate positioning. 0.8 ml lugols solution administered in a.m RADIOPHARMACEUTICALS:  4.8 millicuries I 937 Ioflupane COMPARISON:  None. FINDINGS: Some difficulty in positioning patient.  Study is count poor. The LEFT and RIGHT putamen are poorly demonstrated. There is symmetric activity in the caudate heads which is irregular. The normal comma shaped of the striata is lost. IMPRESSION: Decreased  radiotracer activity within the LEFT and RIGHT putamen and irregular activity irregular shape of the caudate heads is favored to represent a Parkinson's type syndrome pattern. Electronically Signed   By: Suzy Bouchard M.D.   On: 04/20/2018 15:27    Assessment & Plan:   There are no diagnoses linked to this encounter. I am having Hale Drone maintain his CENTRUM SILVER, Vitamin D-3, pyridOXINE, vitamin B-12, aspirin EC, latanoprost, brimonidine, olmesartan, folic acid, niacin, Vitamin D (Ergocalciferol), metFORMIN, apixaban, gabapentin, and furosemide.  Meds ordered this encounter  Medications  . furosemide (LASIX) 20 MG tablet    Sig: Take 20-40 mgs by mouth 3 times per week as needed for leg swelling    Dispense:  90 tablet    Refill:  3     Follow-up: No follow-ups on file.  Walker Kehr, MD

## 2018-04-26 NOTE — Progress Notes (Signed)
Edward [name] was seen today in the movement disorders clinic for neurologic consultation at the request of Plotnikov, Evie Lacks, MD.  The consultation is for the evaluation of bradykinesia.  The records that were made available to me were reviewed. This patient is accompanied in the office by his spouse who supplements the history. Pt saw Dr. Saintclair Halsted to see if walking issues were spine related and they were felt not to be.  Dr. Saintclair Halsted felt that he was slow and had early parkinsonism and sent here for evaluation of such.  Specific Symptoms:  Tremor: Yes.  , minor when holds out the hands Family hx of similar:  No., brother has drop foot Voice: softer than in the past per wife Sleep: sleeps well  Vivid Dreams:  No.  Acting out dreams:  No. Wet Pillows: No. Postural symptoms:  Yes.    Falls?  Yes.  , "I feel like I want to fall backward all the time."  Did fall last week when trying to get walker out of the car and fell backward.  Didn't get hurt.  Wife describes other "minor falls" back into the chair Bradykinesia symptoms: shuffling gait, slow movements, difficulty getting out of a chair and drags the L leg (pt attributes to knee issue - tore patellar tendons in 2013 and had to have surgery and not right after) Loss of smell:  No. Loss of taste:  No. Urinary Incontinence:  No. Difficulty Swallowing:  No. Handwriting, micrographia: "always been bad" Trouble with ADL's:  Yes.  , has to manually pick up L leg to get leg into pants.  Wife does ADL's.  Sits in shower and wife helps him.    Trouble buttoning clothing: No. Depression:  Pt denies but is frustrated with physical limitations Memory changes:  No. Hallucinations:  No.  visual distortions: No. N/V:  No. Lightheaded:  No.  Syncope: No. Diplopia:  No. Dyskinesia:  No.  Neuroimaging of the brain has not previously been performed.  Patient has had MRI of the lumbar, thoracic and cervical spine.  This was done in March, 2019.  MRI of  the lumbar spine demonstrated left laminectomy at L3-L4 and L4-L5. Mri of the thoracic spine was essentially unremarkable.  MRI of the cervical spine with moderate neuroforaminal stenosis at C6-C7 levels, severe facet arthrosis at C7-T1.  There is multilevel degenerative changes.  04/27/18 update: Patient is seen today in follow-up, accompanied by his wife who supplements the history.  He did have a DaT scan since our last visit.  This demonstrated decreased radiotracer activity in the bilateral putamen.  Clinically, the patient is same as last visit.  Reports that went to PCP for constipation and given linzess but this caused GI upset.  PREVIOUS MEDICATIONS: none to date  ALLERGIES:   Allergies  Allergen Reactions  . Naproxen Sodium Other (See Comments)    low platelets    CURRENT MEDICATIONS:  Outpatient Encounter Medications as of 04/27/2018  Medication Sig  . apixaban (ELIQUIS) 5 MG TABS tablet Take 1 tablet (5 mg total) 2 (two) times daily by mouth.  Marland Kitchen aspirin EC 81 MG tablet Take 81 mg by mouth daily.  . brimonidine (ALPHAGAN P) 0.1 % SOLN Place 1 drop into both eyes 2 (two) times daily.  . Cholecalciferol (VITAMIN D-3) 1000 UNITS CAPS Take 2,000 Units by mouth every morning.   . folic acid (FOLVITE) 1 MG tablet Take 1 tablet (1 mg total) daily by mouth.  . furosemide (LASIX)  20 MG tablet Take 20-40 mgs by mouth 3 times per week as needed for leg swelling  . gabapentin (NEURONTIN) 300 MG capsule Take 300 mg by mouth at bedtime.  Marland Kitchen latanoprost (XALATAN) 0.005 % ophthalmic solution Place 1 drop into both eyes at bedtime.  . metFORMIN (GLUCOPHAGE) 500 MG tablet Take 1 tablet (500 mg total) 2 (two) times daily with a meal by mouth.  . Multiple Vitamins-Minerals (CENTRUM SILVER) tablet Take 1 tablet by mouth daily. Reported on 05/25/2016  . niacin (NIASPAN) 500 MG CR tablet Take 1 tablet (500 mg total) daily by mouth.  . olmesartan (BENICAR) 40 MG tablet Take 1 tablet (40 mg total) daily by  mouth.  . pyridOXINE (VITAMIN B-6) 100 MG tablet Take 200 mg by mouth daily.  . vitamin B-12 (CYANOCOBALAMIN) 1000 MCG tablet Take 1,000 mcg by mouth daily.  . Vitamin D, Ergocalciferol, (DRISDOL) 50000 units CAPS capsule Take 1 capsule (50,000 Units total) every 30 (thirty) days by mouth. First of the month   No facility-administered encounter medications on file as of 04/27/2018.     PAST MEDICAL HISTORY:   Past Medical History:  Diagnosis Date  . Back pain    Difficulty walking- left leg pain  . Chronic renal insufficiency    DR WEBB  . Complex renal cyst RIGHT--  MONITORED BY DR Risa Grill  . Crush injury, back   . Drug-induced low platelet count    from NSAIDS  . DVT (deep venous thrombosis) ([place])   . Elevated glucose   . Hearing loss   . History of swelling of feet   . Hyperlipidemia   . Hypertension   . Nocturia   . Prostate cancer ([place])   . Right knee injury    In past  . S/P radiation therapy 06/25/2015 through 08/19/2015   Prostate 7800 cGy in 40 sessions, seminal vesicles 5600 cGy in 40 sessions   . Venous insufficiency of leg   . Wears dentures    partial  . Wears glasses     PAST SURGICAL HISTORY:   Past Surgical History:  Procedure Laterality Date  . COLONOSCOPY  2/77/11  . CRYOABLATION  12/01/2012   Procedure: CRYO ABLATION PROSTATE;  Surgeon: Hanley Ben, MD;  Location: [facility];  Service: Urology;  Laterality: N/A;  . LUMBAR LAMINECTOMY/DECOMPRESSION MICRODISCECTOMY Left 10/28/2016   Procedure: Lumbar Laminectomy/decompression Microdiscectomy lumbar three- lumbar four, lumbar four to lumbar five, Left;  Surgeon: Kary Kos, MD;  Location: [place];  Service: Neurosurgery;  Laterality: Left;  Lumbar Laminectomy/decompression Microdiscectomy lumbar three- lumbar four, lumbar four to lumbar five, Left  . QUADRICEPS TENDON REPAIR Left 10/31/2013   Procedure: Left Quadriceps Tendon Repair;  Surgeon: Marybelle Killings, MD;   Location: [place];  Service: Orthopedics;  Laterality: Left;  Left Quadriceps Tendon Repair  . REPAIR KNEE LIGAMENT Left 2014    SOCIAL HISTORY:   Social History   Socioeconomic History  . Marital status: Married    Spouse name: Not on file  . Number of children: 2  . Years of education: Not on file  . Highest education level: Not on file  Occupational History  . Occupation: retired    Comment: Office manager  Social Needs  . Financial resource strain: Not on file  . Food insecurity:    Worry: Not on file    Inability: Not on file  . Transportation needs:    Medical: Not on file    Non-medical: Not on file  Tobacco  Use  . Smoking status: Never Smoker  . Smokeless tobacco: Never Used  Substance and Sexual Activity  . Alcohol use: No  . Drug use: No  . Sexual activity: Yes  Lifestyle  . Physical activity:    Days per week: Not on file    Minutes per session: Not on file  . Stress: Not on file  Relationships  . Social connections:    Talks on phone: Not on file    Gets together: Not on file    Attends religious service: Not on file    Active member of club or organization: Not on file    Attends meetings of clubs or organizations: Not on file    Relationship status: Not on file  . Intimate partner violence:    Fear of current or ex partner: Not on file    Emotionally abused: Not on file    Physically abused: Not on file    Forced sexual activity: Not on file  Other Topics Concern  . Not on file  Social History Narrative  . Not on file    FAMILY HISTORY:   Family Status  Relation Name Status  . Mother 29 Deceased  . Father  Deceased  . Brother  Alive  . Brother  Alive  . Sister 1 Alive  . Sister 1 Deceased  . Brother  Alive  . Son 2 Alive    ROS:  Some DOE.  A complete 10 system review of systems was obtained and was unremarkable apart from what is mentioned above.  PHYSICAL EXAMINATION:    VITALS:   Vitals:   04/27/18 0849  BP: (!) 156/86    Pulse: 76  SpO2: 95%  Weight: 210 lb (95.3 kg)  Height: _0  (1.854 m)    GEN:  The patient appears stated age and is in NAD. HEENT:  Normocephalic, atraumatic.  The mucous membranes are moist. The superficial temporal arteries are without ropiness or tenderness. CV:  RRR Lungs:  CTAB Neck/HEME:  There are no carotid bruits bilaterally.  Neurological examination:  Orientation: The patient is alert and oriented x3. Fund of knowledge is appropriate.  Recent and remote memory are intact.  Attention and concentration are normal.    Able to name objects and repeat phrases. Cranial nerves: There is good facial symmetry.  there is facial hypomimia.  There is ptosis/pseudoptosis from lid lag.   Pupils are equal round and reactive to light bilaterally. Fundoscopic exam reveals clear margins bilaterally. Extraocular muscles are intact with the exception of upgaze paresis. The visual fields are full to confrontational testing. The speech is fluent and clear.  He is somewhat hypophonic.  Soft palate rises symmetrically and there is no tongue deviation. Hearing is intact to conversational tone. Sensation: Sensation is intact to light touch throughout. Motor: Strength is 5/5 in the RUE and lower extremities.   Strength is 5/5 in the LUE and at least 4/5 in the proximal LLE (hip flexors) and 5/5 in the distal lower extremity.  Shoulder shrug is equal and symmetric.  There is no pronator drift.  Movement examination: Tone: pt has trouble relaxing but appears good tone bilaterally Abnormal movements: slight tremor can be felt with distraction procedures but not seen in the bilateral upper extremities, left more than right. Coordination:  There is no decremation, with any form of RAMS, including alternating supination and pronation of the forearm, hand opening and closing, finger taps, heel taps and toe taps. Gait and Station: The patient has  difficulty arising out of a deep-seated chair without the use of  the hands and is unable to do this.  There is foot drop on the L.   He ambulates with his walker, reporting that he really cannot do it without the walker.  He has very bent knees, particularly on the left.  He is unstable.  Gait is somewhat antalgic (patient reports it has been this way since 2013 since tendon repair around the patella).  Left foot especially has trouble in the turns  Labs:    Chemistry      Component Value Date/Time   NA 142 01/25/2018 0912   K 4.6 01/25/2018 0912   CL 103 01/25/2018 0912   CO2 31 01/25/2018 0912   BUN 26 (H) 01/25/2018 0912   CREATININE 1.74 (H) 01/25/2018 0912      Component Value Date/Time   CALCIUM 9.1 01/25/2018 0912   ALKPHOS 35 (L) 09/24/2015 0906   AST 19 09/24/2015 0906   ALT 23 09/24/2015 0906   BILITOT 0.6 09/24/2015 0906     Lab Results  Component Value Date   TSH 2.54 01/25/2018     ASSESSMENT/PLAN:  1.  Bradykinesia  -I agree with Dr. Saintclair Halsted this patient is bradykinetic, but he does not meet criteria for idiopathic Parkinson's disease.  An atypical state is certainly still on the list of differential and PSP may be on the top of that list.  DaT scan done demonstrated decreased radiotracer activity in the bilateral putamen.  Explained that this is not a diagnostic or therapeutic scan.  Also discussed that, because he feels he is getting worse, we will try him on levodopa and see how he does.  Patient was agreeable.  Discussed risk, benefits, and side effects.    -met with our social worker today to discuss community exercise, particularly ACT  2.  Constipation  -given rancho recipe  -needs to increase water intake.  3.  Follow up is anticipated in the next few months, sooner should new neurologic issues arise.  Much greater than 50% of this visit was spent in counseling and coordinating care.  Total face to face time:  25 min  Cc:  Plotnikov, Evie Lacks, MD

## 2018-04-27 ENCOUNTER — Ambulatory Visit (INDEPENDENT_AMBULATORY_CARE_PROVIDER_SITE_OTHER): Payer: Medicare Other | Admitting: Neurology

## 2018-04-27 ENCOUNTER — Encounter: Payer: Self-pay | Admitting: Neurology

## 2018-04-27 ENCOUNTER — Encounter: Payer: Self-pay | Admitting: Psychology

## 2018-04-27 VITALS — BP 156/86 | HR 76 | Ht 73.0 in | Wt 210.0 lb

## 2018-04-27 DIAGNOSIS — K5901 Slow transit constipation: Secondary | ICD-10-CM | POA: Diagnosis not present

## 2018-04-27 DIAGNOSIS — G20 Parkinson's disease: Secondary | ICD-10-CM | POA: Diagnosis not present

## 2018-04-27 MED ORDER — CARBIDOPA-LEVODOPA 25-100 MG PO TABS
1.0000 | ORAL_TABLET | Freq: Three times a day (TID) | ORAL | 1 refills | Status: DC
Start: 2018-04-27 — End: 2018-09-12

## 2018-04-27 NOTE — Progress Notes (Unsigned)
Met with patient and his wife while they were in the clinic today.    They are getting physical therapy ordered at home.  However, I am going to help get them set up with [employer] gym and a scholarship through [facility].  The plan is that they are going to contact the gym in about a week to go take a look at it with the plan that after physical therapy is finished working with him that he will start work there.  They are originally from [place].  The patient is retired from [employer] and his wife with a clinical psychiatric social worker in [place].  They have 2 children a son who is a Chief Technology Officer in [place] and another son who works in Programmer, applications in [place].  They have 4 grandchildren.  When asked what they like to do the patient reported that he likes to go online and play sweepstakes.  He has a history of enjoying to go to casinos.  His wife reports that she likes to read.  Both the patient and the wife have my contact information and are aware that I will help out with any resources that they need.

## 2018-04-27 NOTE — Patient Instructions (Addendum)
Start carbidopa/levodopa 25/100, 1/2 tab three times a day at least 30 min before meals x 1 wk, then 1/2 in am & 30 min before lunch and 30 min before dinner for a week, then 1/2 in am &1 30 minutes before lunch &one tablet thirty minutes before dinner for a week, then 1 tablet three times a day 30 minutes before meals.  As a reminder, carbidopa/levodopa can be taken at the same time as a carbohydrate, but we like to have you take your pill either 30 minutes before a protein source or 1 hour after as protein can interfere with carbidopa/levodopa absorption.  Constipation and Parkinsonism:  1.Rancho recipe for constipation in Parkinsonism:  -1 cup of unprocessed bran (need to get this at [employer], [employer] or similar type of store), 2 cups of applesauce in 1 cup of prune juice 2.  Increase fiber intake (Metamucil,vegetables) 3.  Regular, moderate exercise can be beneficial. 4.  Avoid medications causing constipation, such as medications like antacids with calcium or magnesium 5.  Laxative overuse should be avoided. 6.  Stool softeners (Colace) can help with chronic constipation. 7.  Increase water intake.  You should be drinking 1/2 gallon of water a day as long as you have not been diagnosed with congestive heart failure or renal/kidney failure.  This is probably the single greatest thing that you can do to help your constipation.

## 2018-05-01 ENCOUNTER — Telehealth: Payer: Self-pay | Admitting: Internal Medicine

## 2018-05-01 DIAGNOSIS — I872 Venous insufficiency (chronic) (peripheral): Secondary | ICD-10-CM | POA: Diagnosis not present

## 2018-05-01 DIAGNOSIS — Z7901 Long term (current) use of anticoagulants: Secondary | ICD-10-CM | POA: Diagnosis not present

## 2018-05-01 DIAGNOSIS — E1122 Type 2 diabetes mellitus with diabetic chronic kidney disease: Secondary | ICD-10-CM | POA: Diagnosis not present

## 2018-05-01 DIAGNOSIS — R627 Adult failure to thrive: Secondary | ICD-10-CM | POA: Diagnosis not present

## 2018-05-01 DIAGNOSIS — Z7984 Long term (current) use of oral hypoglycemic drugs: Secondary | ICD-10-CM | POA: Diagnosis not present

## 2018-05-01 DIAGNOSIS — M48062 Spinal stenosis, lumbar region with neurogenic claudication: Secondary | ICD-10-CM | POA: Diagnosis not present

## 2018-05-01 DIAGNOSIS — Z7982 Long term (current) use of aspirin: Secondary | ICD-10-CM | POA: Diagnosis not present

## 2018-05-01 DIAGNOSIS — E785 Hyperlipidemia, unspecified: Secondary | ICD-10-CM | POA: Diagnosis not present

## 2018-05-01 DIAGNOSIS — I129 Hypertensive chronic kidney disease with stage 1 through stage 4 chronic kidney disease, or unspecified chronic kidney disease: Secondary | ICD-10-CM | POA: Diagnosis not present

## 2018-05-01 DIAGNOSIS — N183 Chronic kidney disease, stage 3 (moderate): Secondary | ICD-10-CM | POA: Diagnosis not present

## 2018-05-01 DIAGNOSIS — Z86718 Personal history of other venous thrombosis and embolism: Secondary | ICD-10-CM | POA: Diagnosis not present

## 2018-05-01 DIAGNOSIS — G20 Parkinson's disease: Secondary | ICD-10-CM | POA: Diagnosis not present

## 2018-05-01 DIAGNOSIS — Z8546 Personal history of malignant neoplasm of prostate: Secondary | ICD-10-CM | POA: Diagnosis not present

## 2018-05-01 NOTE — Telephone Encounter (Signed)
Copied from [place] [phone]. Topic: General - Other >> May 01, 2018  1:19 PM Margot Ables wrote: Reason for CRM: requesting VO to work with pt 2x week for 4 weeks  Medication flag for aspirin and elliquis.

## 2018-05-02 NOTE — Telephone Encounter (Signed)
Notified Stacey w/MD response. She also requesting verbal for speech therapy due to new diagnoses of parkinson. Family c/o of him not speaking correctly, an their not able to understand him. Gave verbal for ST

## 2018-05-02 NOTE — Telephone Encounter (Signed)
Ok Thx 

## 2018-05-04 DIAGNOSIS — I129 Hypertensive chronic kidney disease with stage 1 through stage 4 chronic kidney disease, or unspecified chronic kidney disease: Secondary | ICD-10-CM | POA: Diagnosis not present

## 2018-05-04 DIAGNOSIS — E1122 Type 2 diabetes mellitus with diabetic chronic kidney disease: Secondary | ICD-10-CM | POA: Diagnosis not present

## 2018-05-04 DIAGNOSIS — N183 Chronic kidney disease, stage 3 (moderate): Secondary | ICD-10-CM | POA: Diagnosis not present

## 2018-05-04 DIAGNOSIS — M48062 Spinal stenosis, lumbar region with neurogenic claudication: Secondary | ICD-10-CM | POA: Diagnosis not present

## 2018-05-04 DIAGNOSIS — R627 Adult failure to thrive: Secondary | ICD-10-CM | POA: Diagnosis not present

## 2018-05-04 DIAGNOSIS — G20 Parkinson's disease: Secondary | ICD-10-CM | POA: Diagnosis not present

## 2018-05-05 ENCOUNTER — Telehealth: Payer: Self-pay | Admitting: Internal Medicine

## 2018-05-05 DIAGNOSIS — R627 Adult failure to thrive: Secondary | ICD-10-CM | POA: Diagnosis not present

## 2018-05-05 DIAGNOSIS — H401133 Primary open-angle glaucoma, bilateral, severe stage: Secondary | ICD-10-CM | POA: Diagnosis not present

## 2018-05-05 DIAGNOSIS — E1122 Type 2 diabetes mellitus with diabetic chronic kidney disease: Secondary | ICD-10-CM | POA: Diagnosis not present

## 2018-05-05 DIAGNOSIS — I129 Hypertensive chronic kidney disease with stage 1 through stage 4 chronic kidney disease, or unspecified chronic kidney disease: Secondary | ICD-10-CM | POA: Diagnosis not present

## 2018-05-05 DIAGNOSIS — N183 Chronic kidney disease, stage 3 (moderate): Secondary | ICD-10-CM | POA: Diagnosis not present

## 2018-05-05 DIAGNOSIS — H2513 Age-related nuclear cataract, bilateral: Secondary | ICD-10-CM | POA: Diagnosis not present

## 2018-05-05 DIAGNOSIS — M48062 Spinal stenosis, lumbar region with neurogenic claudication: Secondary | ICD-10-CM | POA: Diagnosis not present

## 2018-05-05 DIAGNOSIS — G20 Parkinson's disease: Secondary | ICD-10-CM | POA: Diagnosis not present

## 2018-05-05 DIAGNOSIS — E119 Type 2 diabetes mellitus without complications: Secondary | ICD-10-CM | POA: Diagnosis not present

## 2018-05-05 NOTE — Telephone Encounter (Signed)
Copied from [place] [phone]. Topic: Quick Communication - See Telephone Encounter >> May 05, 2018  1:43 PM Neva Seat wrote: [place] [phone]  Verbal order for OT:  1 week for 1 week 2 weeks for 3 weeks

## 2018-05-05 NOTE — Telephone Encounter (Signed)
OK. Thx

## 2018-05-08 ENCOUNTER — Telehealth: Payer: Self-pay | Admitting: Internal Medicine

## 2018-05-08 DIAGNOSIS — R627 Adult failure to thrive: Secondary | ICD-10-CM | POA: Diagnosis not present

## 2018-05-08 DIAGNOSIS — G20 Parkinson's disease: Secondary | ICD-10-CM | POA: Diagnosis not present

## 2018-05-08 DIAGNOSIS — M48062 Spinal stenosis, lumbar region with neurogenic claudication: Secondary | ICD-10-CM | POA: Diagnosis not present

## 2018-05-08 DIAGNOSIS — N183 Chronic kidney disease, stage 3 (moderate): Secondary | ICD-10-CM | POA: Diagnosis not present

## 2018-05-08 DIAGNOSIS — E1122 Type 2 diabetes mellitus with diabetic chronic kidney disease: Secondary | ICD-10-CM | POA: Diagnosis not present

## 2018-05-08 DIAGNOSIS — I129 Hypertensive chronic kidney disease with stage 1 through stage 4 chronic kidney disease, or unspecified chronic kidney disease: Secondary | ICD-10-CM | POA: Diagnosis not present

## 2018-05-08 MED ORDER — CANDESARTAN CILEXETIL 32 MG PO TABS: 32.0000 mg | ORAL_TABLET | Freq: Every day | ORAL | 3 refills | Status: DC

## 2018-05-08 NOTE — Telephone Encounter (Signed)
Notified Ester w/MD response.../lmb 

## 2018-05-08 NOTE — Telephone Encounter (Signed)
Copied from [place] [phone]. Topic: Quick Communication - Rx Refill/Question >> May 08, 2018 11:20 AM Scherrie Gerlach wrote: Medication:  olmesartan (BENICAR) 40 MG tablet Has the patient contacted their pharmacy? yes Wife calling to ask for a substitute for this med.  [facility] states they are and have been out of this med for several weeks, advised pt to call the dr for substitute. [facility] [address], [place] [phone] (Phone) [phone] (Fax)

## 2018-05-08 NOTE — Telephone Encounter (Signed)
Candesartan emailed Thx

## 2018-05-11 DIAGNOSIS — N183 Chronic kidney disease, stage 3 (moderate): Secondary | ICD-10-CM | POA: Diagnosis not present

## 2018-05-11 DIAGNOSIS — I129 Hypertensive chronic kidney disease with stage 1 through stage 4 chronic kidney disease, or unspecified chronic kidney disease: Secondary | ICD-10-CM | POA: Diagnosis not present

## 2018-05-11 DIAGNOSIS — E1122 Type 2 diabetes mellitus with diabetic chronic kidney disease: Secondary | ICD-10-CM | POA: Diagnosis not present

## 2018-05-11 DIAGNOSIS — R627 Adult failure to thrive: Secondary | ICD-10-CM | POA: Diagnosis not present

## 2018-05-11 DIAGNOSIS — M48062 Spinal stenosis, lumbar region with neurogenic claudication: Secondary | ICD-10-CM | POA: Diagnosis not present

## 2018-05-11 DIAGNOSIS — G20 Parkinson's disease: Secondary | ICD-10-CM | POA: Diagnosis not present

## 2018-05-12 DIAGNOSIS — M48062 Spinal stenosis, lumbar region with neurogenic claudication: Secondary | ICD-10-CM | POA: Diagnosis not present

## 2018-05-12 DIAGNOSIS — E1122 Type 2 diabetes mellitus with diabetic chronic kidney disease: Secondary | ICD-10-CM | POA: Diagnosis not present

## 2018-05-12 DIAGNOSIS — G20 Parkinson's disease: Secondary | ICD-10-CM | POA: Diagnosis not present

## 2018-05-12 DIAGNOSIS — N183 Chronic kidney disease, stage 3 (moderate): Secondary | ICD-10-CM | POA: Diagnosis not present

## 2018-05-12 DIAGNOSIS — I129 Hypertensive chronic kidney disease with stage 1 through stage 4 chronic kidney disease, or unspecified chronic kidney disease: Secondary | ICD-10-CM | POA: Diagnosis not present

## 2018-05-12 DIAGNOSIS — R627 Adult failure to thrive: Secondary | ICD-10-CM | POA: Diagnosis not present

## 2018-05-15 DIAGNOSIS — E1122 Type 2 diabetes mellitus with diabetic chronic kidney disease: Secondary | ICD-10-CM | POA: Diagnosis not present

## 2018-05-15 DIAGNOSIS — M48062 Spinal stenosis, lumbar region with neurogenic claudication: Secondary | ICD-10-CM | POA: Diagnosis not present

## 2018-05-15 DIAGNOSIS — G20 Parkinson's disease: Secondary | ICD-10-CM | POA: Diagnosis not present

## 2018-05-15 DIAGNOSIS — I129 Hypertensive chronic kidney disease with stage 1 through stage 4 chronic kidney disease, or unspecified chronic kidney disease: Secondary | ICD-10-CM | POA: Diagnosis not present

## 2018-05-15 DIAGNOSIS — R627 Adult failure to thrive: Secondary | ICD-10-CM | POA: Diagnosis not present

## 2018-05-15 DIAGNOSIS — N183 Chronic kidney disease, stage 3 (moderate): Secondary | ICD-10-CM | POA: Diagnosis not present

## 2018-05-16 ENCOUNTER — Telehealth: Payer: Self-pay | Admitting: Internal Medicine

## 2018-05-16 DIAGNOSIS — G20 Parkinson's disease: Secondary | ICD-10-CM | POA: Diagnosis not present

## 2018-05-16 DIAGNOSIS — N183 Chronic kidney disease, stage 3 (moderate): Secondary | ICD-10-CM | POA: Diagnosis not present

## 2018-05-16 DIAGNOSIS — M48062 Spinal stenosis, lumbar region with neurogenic claudication: Secondary | ICD-10-CM | POA: Diagnosis not present

## 2018-05-16 DIAGNOSIS — R627 Adult failure to thrive: Secondary | ICD-10-CM | POA: Diagnosis not present

## 2018-05-16 DIAGNOSIS — I129 Hypertensive chronic kidney disease with stage 1 through stage 4 chronic kidney disease, or unspecified chronic kidney disease: Secondary | ICD-10-CM | POA: Diagnosis not present

## 2018-05-16 DIAGNOSIS — E1122 Type 2 diabetes mellitus with diabetic chronic kidney disease: Secondary | ICD-10-CM | POA: Diagnosis not present

## 2018-05-16 NOTE — Telephone Encounter (Signed)
Copied from [place] [phone]. Topic: General - Other >> May 16, 2018  1:07 PM Yvette Rack wrote: Reason for CRM: Esther with [facility] stated pt missed OT visit last week due to scheduling difficulty. Cb# [id number]

## 2018-05-17 ENCOUNTER — Telehealth: Payer: Self-pay | Admitting: Internal Medicine

## 2018-05-17 DIAGNOSIS — E1122 Type 2 diabetes mellitus with diabetic chronic kidney disease: Secondary | ICD-10-CM | POA: Diagnosis not present

## 2018-05-17 DIAGNOSIS — R627 Adult failure to thrive: Secondary | ICD-10-CM | POA: Diagnosis not present

## 2018-05-17 DIAGNOSIS — M48062 Spinal stenosis, lumbar region with neurogenic claudication: Secondary | ICD-10-CM | POA: Diagnosis not present

## 2018-05-17 DIAGNOSIS — G20 Parkinson's disease: Secondary | ICD-10-CM | POA: Diagnosis not present

## 2018-05-17 DIAGNOSIS — N183 Chronic kidney disease, stage 3 (moderate): Secondary | ICD-10-CM | POA: Diagnosis not present

## 2018-05-17 DIAGNOSIS — I129 Hypertensive chronic kidney disease with stage 1 through stage 4 chronic kidney disease, or unspecified chronic kidney disease: Secondary | ICD-10-CM | POA: Diagnosis not present

## 2018-05-17 NOTE — Telephone Encounter (Signed)
Copied from [place] [phone]. Topic: Inquiry >> May 16, 2018  4:43 PM Oliver Pila B wrote: Reason for CRM: [facility] called for verbals for speech therapy starting June 10th, evaluation was done today, contact [phone]

## 2018-05-17 NOTE — Telephone Encounter (Signed)
Verbals given, FYI 

## 2018-05-17 NOTE — Telephone Encounter (Signed)
LM with Sherlynn Stalls

## 2018-05-18 DIAGNOSIS — N183 Chronic kidney disease, stage 3 (moderate): Secondary | ICD-10-CM | POA: Diagnosis not present

## 2018-05-18 DIAGNOSIS — G20 Parkinson's disease: Secondary | ICD-10-CM | POA: Diagnosis not present

## 2018-05-18 DIAGNOSIS — E1122 Type 2 diabetes mellitus with diabetic chronic kidney disease: Secondary | ICD-10-CM | POA: Diagnosis not present

## 2018-05-18 DIAGNOSIS — M48062 Spinal stenosis, lumbar region with neurogenic claudication: Secondary | ICD-10-CM | POA: Diagnosis not present

## 2018-05-18 DIAGNOSIS — R627 Adult failure to thrive: Secondary | ICD-10-CM | POA: Diagnosis not present

## 2018-05-18 DIAGNOSIS — I129 Hypertensive chronic kidney disease with stage 1 through stage 4 chronic kidney disease, or unspecified chronic kidney disease: Secondary | ICD-10-CM | POA: Diagnosis not present

## 2018-05-22 DIAGNOSIS — M48062 Spinal stenosis, lumbar region with neurogenic claudication: Secondary | ICD-10-CM | POA: Diagnosis not present

## 2018-05-22 DIAGNOSIS — R627 Adult failure to thrive: Secondary | ICD-10-CM | POA: Diagnosis not present

## 2018-05-22 DIAGNOSIS — N183 Chronic kidney disease, stage 3 (moderate): Secondary | ICD-10-CM | POA: Diagnosis not present

## 2018-05-22 DIAGNOSIS — E1122 Type 2 diabetes mellitus with diabetic chronic kidney disease: Secondary | ICD-10-CM | POA: Diagnosis not present

## 2018-05-22 DIAGNOSIS — G20 Parkinson's disease: Secondary | ICD-10-CM | POA: Diagnosis not present

## 2018-05-22 DIAGNOSIS — I129 Hypertensive chronic kidney disease with stage 1 through stage 4 chronic kidney disease, or unspecified chronic kidney disease: Secondary | ICD-10-CM | POA: Diagnosis not present

## 2018-05-23 DIAGNOSIS — G20 Parkinson's disease: Secondary | ICD-10-CM | POA: Diagnosis not present

## 2018-05-23 DIAGNOSIS — M48062 Spinal stenosis, lumbar region with neurogenic claudication: Secondary | ICD-10-CM | POA: Diagnosis not present

## 2018-05-23 DIAGNOSIS — R627 Adult failure to thrive: Secondary | ICD-10-CM | POA: Diagnosis not present

## 2018-05-23 DIAGNOSIS — N183 Chronic kidney disease, stage 3 (moderate): Secondary | ICD-10-CM | POA: Diagnosis not present

## 2018-05-23 DIAGNOSIS — I129 Hypertensive chronic kidney disease with stage 1 through stage 4 chronic kidney disease, or unspecified chronic kidney disease: Secondary | ICD-10-CM | POA: Diagnosis not present

## 2018-05-23 DIAGNOSIS — E1122 Type 2 diabetes mellitus with diabetic chronic kidney disease: Secondary | ICD-10-CM | POA: Diagnosis not present

## 2018-05-25 DIAGNOSIS — E1122 Type 2 diabetes mellitus with diabetic chronic kidney disease: Secondary | ICD-10-CM | POA: Diagnosis not present

## 2018-05-25 DIAGNOSIS — M48062 Spinal stenosis, lumbar region with neurogenic claudication: Secondary | ICD-10-CM | POA: Diagnosis not present

## 2018-05-25 DIAGNOSIS — R627 Adult failure to thrive: Secondary | ICD-10-CM | POA: Diagnosis not present

## 2018-05-25 DIAGNOSIS — G20 Parkinson's disease: Secondary | ICD-10-CM | POA: Diagnosis not present

## 2018-05-25 DIAGNOSIS — I129 Hypertensive chronic kidney disease with stage 1 through stage 4 chronic kidney disease, or unspecified chronic kidney disease: Secondary | ICD-10-CM | POA: Diagnosis not present

## 2018-05-25 DIAGNOSIS — N183 Chronic kidney disease, stage 3 (moderate): Secondary | ICD-10-CM | POA: Diagnosis not present

## 2018-05-26 DIAGNOSIS — I129 Hypertensive chronic kidney disease with stage 1 through stage 4 chronic kidney disease, or unspecified chronic kidney disease: Secondary | ICD-10-CM | POA: Diagnosis not present

## 2018-05-26 DIAGNOSIS — E1122 Type 2 diabetes mellitus with diabetic chronic kidney disease: Secondary | ICD-10-CM | POA: Diagnosis not present

## 2018-05-26 DIAGNOSIS — R627 Adult failure to thrive: Secondary | ICD-10-CM | POA: Diagnosis not present

## 2018-05-26 DIAGNOSIS — M48062 Spinal stenosis, lumbar region with neurogenic claudication: Secondary | ICD-10-CM | POA: Diagnosis not present

## 2018-05-26 DIAGNOSIS — G20 Parkinson's disease: Secondary | ICD-10-CM | POA: Diagnosis not present

## 2018-05-26 DIAGNOSIS — N183 Chronic kidney disease, stage 3 (moderate): Secondary | ICD-10-CM | POA: Diagnosis not present

## 2018-05-29 DIAGNOSIS — Z7984 Long term (current) use of oral hypoglycemic drugs: Secondary | ICD-10-CM | POA: Diagnosis not present

## 2018-05-29 DIAGNOSIS — Z7901 Long term (current) use of anticoagulants: Secondary | ICD-10-CM | POA: Diagnosis not present

## 2018-05-29 DIAGNOSIS — E785 Hyperlipidemia, unspecified: Secondary | ICD-10-CM | POA: Diagnosis not present

## 2018-05-29 DIAGNOSIS — Z8546 Personal history of malignant neoplasm of prostate: Secondary | ICD-10-CM

## 2018-05-29 DIAGNOSIS — R627 Adult failure to thrive: Secondary | ICD-10-CM | POA: Diagnosis not present

## 2018-05-29 DIAGNOSIS — N183 Chronic kidney disease, stage 3 (moderate): Secondary | ICD-10-CM | POA: Diagnosis not present

## 2018-05-29 DIAGNOSIS — Z7982 Long term (current) use of aspirin: Secondary | ICD-10-CM | POA: Diagnosis not present

## 2018-05-29 DIAGNOSIS — M48062 Spinal stenosis, lumbar region with neurogenic claudication: Secondary | ICD-10-CM | POA: Diagnosis not present

## 2018-05-29 DIAGNOSIS — Z86718 Personal history of other venous thrombosis and embolism: Secondary | ICD-10-CM | POA: Diagnosis not present

## 2018-05-29 DIAGNOSIS — I129 Hypertensive chronic kidney disease with stage 1 through stage 4 chronic kidney disease, or unspecified chronic kidney disease: Secondary | ICD-10-CM | POA: Diagnosis not present

## 2018-05-29 DIAGNOSIS — G20 Parkinson's disease: Secondary | ICD-10-CM | POA: Diagnosis not present

## 2018-05-29 DIAGNOSIS — E1122 Type 2 diabetes mellitus with diabetic chronic kidney disease: Secondary | ICD-10-CM | POA: Diagnosis not present

## 2018-05-29 DIAGNOSIS — I872 Venous insufficiency (chronic) (peripheral): Secondary | ICD-10-CM | POA: Diagnosis not present

## 2018-05-30 DIAGNOSIS — M5137 Other intervertebral disc degeneration, lumbosacral region: Secondary | ICD-10-CM | POA: Diagnosis not present

## 2018-05-30 DIAGNOSIS — R258 Other abnormal involuntary movements: Secondary | ICD-10-CM | POA: Diagnosis not present

## 2018-05-31 DIAGNOSIS — M48062 Spinal stenosis, lumbar region with neurogenic claudication: Secondary | ICD-10-CM | POA: Diagnosis not present

## 2018-05-31 DIAGNOSIS — I129 Hypertensive chronic kidney disease with stage 1 through stage 4 chronic kidney disease, or unspecified chronic kidney disease: Secondary | ICD-10-CM | POA: Diagnosis not present

## 2018-05-31 DIAGNOSIS — R627 Adult failure to thrive: Secondary | ICD-10-CM | POA: Diagnosis not present

## 2018-05-31 DIAGNOSIS — N183 Chronic kidney disease, stage 3 (moderate): Secondary | ICD-10-CM | POA: Diagnosis not present

## 2018-05-31 DIAGNOSIS — E1122 Type 2 diabetes mellitus with diabetic chronic kidney disease: Secondary | ICD-10-CM | POA: Diagnosis not present

## 2018-05-31 DIAGNOSIS — G20 Parkinson's disease: Secondary | ICD-10-CM | POA: Diagnosis not present

## 2018-06-02 DIAGNOSIS — N183 Chronic kidney disease, stage 3 (moderate): Secondary | ICD-10-CM | POA: Diagnosis not present

## 2018-06-02 DIAGNOSIS — E1122 Type 2 diabetes mellitus with diabetic chronic kidney disease: Secondary | ICD-10-CM | POA: Diagnosis not present

## 2018-06-02 DIAGNOSIS — R627 Adult failure to thrive: Secondary | ICD-10-CM | POA: Diagnosis not present

## 2018-06-02 DIAGNOSIS — M48062 Spinal stenosis, lumbar region with neurogenic claudication: Secondary | ICD-10-CM | POA: Diagnosis not present

## 2018-06-02 DIAGNOSIS — G20 Parkinson's disease: Secondary | ICD-10-CM | POA: Diagnosis not present

## 2018-06-02 DIAGNOSIS — I129 Hypertensive chronic kidney disease with stage 1 through stage 4 chronic kidney disease, or unspecified chronic kidney disease: Secondary | ICD-10-CM | POA: Diagnosis not present

## 2018-06-05 DIAGNOSIS — N183 Chronic kidney disease, stage 3 (moderate): Secondary | ICD-10-CM | POA: Diagnosis not present

## 2018-06-05 DIAGNOSIS — M48062 Spinal stenosis, lumbar region with neurogenic claudication: Secondary | ICD-10-CM | POA: Diagnosis not present

## 2018-06-05 DIAGNOSIS — I129 Hypertensive chronic kidney disease with stage 1 through stage 4 chronic kidney disease, or unspecified chronic kidney disease: Secondary | ICD-10-CM | POA: Diagnosis not present

## 2018-06-05 DIAGNOSIS — R627 Adult failure to thrive: Secondary | ICD-10-CM | POA: Diagnosis not present

## 2018-06-05 DIAGNOSIS — E1122 Type 2 diabetes mellitus with diabetic chronic kidney disease: Secondary | ICD-10-CM | POA: Diagnosis not present

## 2018-06-05 DIAGNOSIS — G20 Parkinson's disease: Secondary | ICD-10-CM | POA: Diagnosis not present

## 2018-06-07 DIAGNOSIS — M48062 Spinal stenosis, lumbar region with neurogenic claudication: Secondary | ICD-10-CM | POA: Diagnosis not present

## 2018-06-07 DIAGNOSIS — G20 Parkinson's disease: Secondary | ICD-10-CM | POA: Diagnosis not present

## 2018-06-07 DIAGNOSIS — N183 Chronic kidney disease, stage 3 (moderate): Secondary | ICD-10-CM | POA: Diagnosis not present

## 2018-06-07 DIAGNOSIS — E1122 Type 2 diabetes mellitus with diabetic chronic kidney disease: Secondary | ICD-10-CM | POA: Diagnosis not present

## 2018-06-07 DIAGNOSIS — R627 Adult failure to thrive: Secondary | ICD-10-CM | POA: Diagnosis not present

## 2018-06-07 DIAGNOSIS — I129 Hypertensive chronic kidney disease with stage 1 through stage 4 chronic kidney disease, or unspecified chronic kidney disease: Secondary | ICD-10-CM | POA: Diagnosis not present

## 2018-06-08 DIAGNOSIS — R627 Adult failure to thrive: Secondary | ICD-10-CM | POA: Diagnosis not present

## 2018-06-08 DIAGNOSIS — G20 Parkinson's disease: Secondary | ICD-10-CM | POA: Diagnosis not present

## 2018-06-08 DIAGNOSIS — I129 Hypertensive chronic kidney disease with stage 1 through stage 4 chronic kidney disease, or unspecified chronic kidney disease: Secondary | ICD-10-CM | POA: Diagnosis not present

## 2018-06-08 DIAGNOSIS — M48062 Spinal stenosis, lumbar region with neurogenic claudication: Secondary | ICD-10-CM | POA: Diagnosis not present

## 2018-06-08 DIAGNOSIS — N183 Chronic kidney disease, stage 3 (moderate): Secondary | ICD-10-CM | POA: Diagnosis not present

## 2018-06-08 DIAGNOSIS — E1122 Type 2 diabetes mellitus with diabetic chronic kidney disease: Secondary | ICD-10-CM | POA: Diagnosis not present

## 2018-06-12 DIAGNOSIS — G20 Parkinson's disease: Secondary | ICD-10-CM | POA: Diagnosis not present

## 2018-06-12 DIAGNOSIS — M48062 Spinal stenosis, lumbar region with neurogenic claudication: Secondary | ICD-10-CM | POA: Diagnosis not present

## 2018-06-12 DIAGNOSIS — E1122 Type 2 diabetes mellitus with diabetic chronic kidney disease: Secondary | ICD-10-CM | POA: Diagnosis not present

## 2018-06-12 DIAGNOSIS — N183 Chronic kidney disease, stage 3 (moderate): Secondary | ICD-10-CM | POA: Diagnosis not present

## 2018-06-12 DIAGNOSIS — R627 Adult failure to thrive: Secondary | ICD-10-CM | POA: Diagnosis not present

## 2018-06-12 DIAGNOSIS — I129 Hypertensive chronic kidney disease with stage 1 through stage 4 chronic kidney disease, or unspecified chronic kidney disease: Secondary | ICD-10-CM | POA: Diagnosis not present

## 2018-06-16 DIAGNOSIS — R627 Adult failure to thrive: Secondary | ICD-10-CM | POA: Diagnosis not present

## 2018-06-16 DIAGNOSIS — E1122 Type 2 diabetes mellitus with diabetic chronic kidney disease: Secondary | ICD-10-CM | POA: Diagnosis not present

## 2018-06-16 DIAGNOSIS — N183 Chronic kidney disease, stage 3 (moderate): Secondary | ICD-10-CM | POA: Diagnosis not present

## 2018-06-16 DIAGNOSIS — M48062 Spinal stenosis, lumbar region with neurogenic claudication: Secondary | ICD-10-CM | POA: Diagnosis not present

## 2018-06-16 DIAGNOSIS — G20 Parkinson's disease: Secondary | ICD-10-CM | POA: Diagnosis not present

## 2018-06-16 DIAGNOSIS — I129 Hypertensive chronic kidney disease with stage 1 through stage 4 chronic kidney disease, or unspecified chronic kidney disease: Secondary | ICD-10-CM | POA: Diagnosis not present

## 2018-06-19 ENCOUNTER — Telehealth: Payer: Self-pay | Admitting: Internal Medicine

## 2018-06-19 NOTE — Telephone Encounter (Signed)
Okay. Thank you.

## 2018-06-19 NOTE — Telephone Encounter (Signed)
Copied from [place] [phone]. Topic: Inquiry >> Jun 19, 2018 10:40 AM Lennox Solders wrote: Reason for CRM:amy mckee ST from [facility] is calling needing verbal order for three times a wk for 3 wks beginning 06/26/18 for home health speech therapy.  Amy had to cancelled one visit last week due to her illness

## 2018-06-20 NOTE — Telephone Encounter (Signed)
Called Amy no answer LMOM w/MD response,,/lmb

## 2018-06-28 DIAGNOSIS — R627 Adult failure to thrive: Secondary | ICD-10-CM | POA: Diagnosis not present

## 2018-06-28 DIAGNOSIS — G20 Parkinson's disease: Secondary | ICD-10-CM | POA: Diagnosis not present

## 2018-06-28 DIAGNOSIS — M48062 Spinal stenosis, lumbar region with neurogenic claudication: Secondary | ICD-10-CM | POA: Diagnosis not present

## 2018-06-28 DIAGNOSIS — E1122 Type 2 diabetes mellitus with diabetic chronic kidney disease: Secondary | ICD-10-CM | POA: Diagnosis not present

## 2018-06-28 DIAGNOSIS — N183 Chronic kidney disease, stage 3 (moderate): Secondary | ICD-10-CM | POA: Diagnosis not present

## 2018-06-28 DIAGNOSIS — I129 Hypertensive chronic kidney disease with stage 1 through stage 4 chronic kidney disease, or unspecified chronic kidney disease: Secondary | ICD-10-CM | POA: Diagnosis not present

## 2018-06-29 DIAGNOSIS — G20 Parkinson's disease: Secondary | ICD-10-CM | POA: Diagnosis not present

## 2018-06-29 DIAGNOSIS — M48062 Spinal stenosis, lumbar region with neurogenic claudication: Secondary | ICD-10-CM | POA: Diagnosis not present

## 2018-06-29 DIAGNOSIS — I129 Hypertensive chronic kidney disease with stage 1 through stage 4 chronic kidney disease, or unspecified chronic kidney disease: Secondary | ICD-10-CM | POA: Diagnosis not present

## 2018-06-29 DIAGNOSIS — E1122 Type 2 diabetes mellitus with diabetic chronic kidney disease: Secondary | ICD-10-CM | POA: Diagnosis not present

## 2018-06-29 DIAGNOSIS — R627 Adult failure to thrive: Secondary | ICD-10-CM | POA: Diagnosis not present

## 2018-06-29 DIAGNOSIS — N183 Chronic kidney disease, stage 3 (moderate): Secondary | ICD-10-CM | POA: Diagnosis not present

## 2018-06-30 ENCOUNTER — Other Ambulatory Visit (INDEPENDENT_AMBULATORY_CARE_PROVIDER_SITE_OTHER): Payer: Medicare Other

## 2018-06-30 ENCOUNTER — Ambulatory Visit (INDEPENDENT_AMBULATORY_CARE_PROVIDER_SITE_OTHER): Payer: Medicare Other | Admitting: Internal Medicine

## 2018-06-30 ENCOUNTER — Encounter: Payer: Self-pay | Admitting: Internal Medicine

## 2018-06-30 DIAGNOSIS — E785 Hyperlipidemia, unspecified: Secondary | ICD-10-CM | POA: Diagnosis not present

## 2018-06-30 DIAGNOSIS — N183 Chronic kidney disease, stage 3 (moderate): Secondary | ICD-10-CM

## 2018-06-30 DIAGNOSIS — K5901 Slow transit constipation: Secondary | ICD-10-CM

## 2018-06-30 DIAGNOSIS — E1122 Type 2 diabetes mellitus with diabetic chronic kidney disease: Secondary | ICD-10-CM

## 2018-06-30 DIAGNOSIS — G8929 Other chronic pain: Secondary | ICD-10-CM

## 2018-06-30 DIAGNOSIS — I82412 Acute embolism and thrombosis of left femoral vein: Secondary | ICD-10-CM | POA: Diagnosis not present

## 2018-06-30 DIAGNOSIS — I872 Venous insufficiency (chronic) (peripheral): Secondary | ICD-10-CM | POA: Diagnosis not present

## 2018-06-30 DIAGNOSIS — Z7901 Long term (current) use of anticoagulants: Secondary | ICD-10-CM | POA: Diagnosis not present

## 2018-06-30 DIAGNOSIS — R269 Unspecified abnormalities of gait and mobility: Secondary | ICD-10-CM

## 2018-06-30 DIAGNOSIS — E119 Type 2 diabetes mellitus without complications: Secondary | ICD-10-CM | POA: Diagnosis not present

## 2018-06-30 DIAGNOSIS — C61 Malignant neoplasm of prostate: Secondary | ICD-10-CM

## 2018-06-30 DIAGNOSIS — M48062 Spinal stenosis, lumbar region with neurogenic claudication: Secondary | ICD-10-CM | POA: Diagnosis not present

## 2018-06-30 DIAGNOSIS — I129 Hypertensive chronic kidney disease with stage 1 through stage 4 chronic kidney disease, or unspecified chronic kidney disease: Secondary | ICD-10-CM | POA: Diagnosis not present

## 2018-06-30 DIAGNOSIS — M545 Low back pain: Secondary | ICD-10-CM

## 2018-06-30 DIAGNOSIS — R627 Adult failure to thrive: Secondary | ICD-10-CM | POA: Diagnosis not present

## 2018-06-30 DIAGNOSIS — Z86718 Personal history of other venous thrombosis and embolism: Secondary | ICD-10-CM | POA: Diagnosis not present

## 2018-06-30 DIAGNOSIS — G20 Parkinson's disease: Secondary | ICD-10-CM | POA: Diagnosis not present

## 2018-06-30 DIAGNOSIS — I10 Essential (primary) hypertension: Secondary | ICD-10-CM

## 2018-06-30 DIAGNOSIS — Z7984 Long term (current) use of oral hypoglycemic drugs: Secondary | ICD-10-CM | POA: Diagnosis not present

## 2018-06-30 DIAGNOSIS — Z7982 Long term (current) use of aspirin: Secondary | ICD-10-CM | POA: Diagnosis not present

## 2018-06-30 DIAGNOSIS — Z8546 Personal history of malignant neoplasm of prostate: Secondary | ICD-10-CM | POA: Diagnosis not present

## 2018-06-30 LAB — HEMOGLOBIN A1C: Hgb A1c MFr Bld: 6.4 % (ref 4.6–6.5)

## 2018-06-30 LAB — BASIC METABOLIC PANEL
BUN: 28 mg/dL — ABNORMAL HIGH (ref 6–23)
CO2: 29 meq/L (ref 19–32)
CREATININE: 1.58 mg/dL — AB (ref 0.40–1.50)
Calcium: 9.1 mg/dL (ref 8.4–10.5)
Chloride: 103 mEq/L (ref 96–112)
GFR: 54.12 mL/min — ABNORMAL LOW (ref >60.00)
GLUCOSE: 146 mg/dL — AB (ref 70–99)
Potassium: 4.5 mEq/L (ref 3.5–5.1)
SODIUM: 142 meq/L (ref 135–145)

## 2018-06-30 MED ORDER — GABAPENTIN 100 MG PO CAPS: 100.0000 mg | ORAL_CAPSULE | Freq: Three times a day (TID) | ORAL | 3 refills | Status: DC | PRN

## 2018-06-30 NOTE — Assessment & Plan Note (Signed)
Gabapentin 100-300 mg tid prn Refused Tramadol

## 2018-06-30 NOTE — Assessment & Plan Note (Signed)
Walker

## 2018-06-30 NOTE — Assessment & Plan Note (Signed)
[place] 

## 2018-06-30 NOTE — Assessment & Plan Note (Signed)
BMET 

## 2018-06-30 NOTE — Assessment & Plan Note (Signed)
Colace 2 in 1 works

## 2018-06-30 NOTE — Progress Notes (Signed)
Subjective:  Patient ID: Edward [name], male    DOB: 09-22-1933  Age: 84 y.o. MRN: [id number]  CC: No chief complaint on file.   HPI Edward [name] presents for leg weakness, LBP, knee pain, Parkinson's No falls Overall doing worse - less active, sedentary  Outpatient Medications Prior to Visit  Medication Sig Dispense Refill  . apixaban (ELIQUIS) 5 MG TABS tablet Take 1 tablet (5 mg total) 2 (two) times daily by mouth. 180 tablet 3  . aspirin EC 81 MG tablet Take 81 mg by mouth daily.    . brimonidine (ALPHAGAN P) 0.1 % SOLN Place 1 drop into both eyes 2 (two) times daily.    . candesartan (ATACAND) 32 MG tablet Take 1 tablet (32 mg total) by mouth daily. 90 tablet 3  . carbidopa-levodopa (SINEMET IR) 25-100 MG tablet Take 1 tablet by mouth 3 (three) times daily. 270 tablet 1  . Cholecalciferol (VITAMIN D-3) 1000 UNITS CAPS Take 2,000 Units by mouth every morning.     . folic acid (FOLVITE) 1 MG tablet Take 1 tablet (1 mg total) daily by mouth. 90 tablet 3  . furosemide (LASIX) 20 MG tablet Take 20-40 mgs by mouth 3 times per week as needed for leg swelling 90 tablet 3  . gabapentin (NEURONTIN) 300 MG capsule Take 300 mg by mouth at bedtime.    Marland Kitchen latanoprost (XALATAN) 0.005 % ophthalmic solution Place 1 drop into both eyes at bedtime.    . metFORMIN (GLUCOPHAGE) 500 MG tablet Take 1 tablet (500 mg total) 2 (two) times daily with a meal by mouth. 180 tablet 3  . Multiple Vitamins-Minerals (CENTRUM SILVER) tablet Take 1 tablet by mouth daily. Reported on 05/25/2016    . niacin (NIASPAN) 500 MG CR tablet Take 1 tablet (500 mg total) daily by mouth. 90 tablet 3  . pyridOXINE (VITAMIN B-6) 100 MG tablet Take 200 mg by mouth daily.    . vitamin B-12 (CYANOCOBALAMIN) 1000 MCG tablet Take 1,000 mcg by mouth daily.    . Vitamin D, Ergocalciferol, (DRISDOL) 50000 units CAPS capsule Take 1 capsule (50,000 Units total) every 30 (thirty) days by mouth. First of the month 3 capsule 3   No  facility-administered medications prior to visit.     ROS: Review of Systems  Constitutional: Positive for fatigue. Negative for appetite change and unexpected weight change.  HENT: Negative for congestion, nosebleeds, sneezing, sore throat and trouble swallowing.   Eyes: Negative for itching and visual disturbance.  Respiratory: Negative for cough.   Cardiovascular: Negative for chest pain, palpitations and leg swelling.  Gastrointestinal: Negative for abdominal distention, blood in stool, diarrhea and nausea.  Genitourinary: Negative for frequency and hematuria.  Musculoskeletal: Positive for arthralgias, back pain and gait problem. Negative for joint swelling and neck pain.  Skin: Negative for rash.  Neurological: Positive for dizziness, tremors and weakness. Negative for speech difficulty.  Psychiatric/Behavioral: Positive for behavioral problems and dysphoric mood. Negative for agitation and sleep disturbance. The patient is nervous/anxious.     Objective:  BP 140/78 (BP Location: Right Arm, Patient Position: Sitting, Cuff Size: Large)   Pulse 80   Temp 98.4 F (36.9 C) (Oral)   Ht 6\' 1"  (1.854 m)   Wt 215 lb (97.5 kg)   SpO2 98%   BMI 28.37 kg/m   BP Readings from Last 3 Encounters:  06/30/18 140/78  04/27/18 (!) 156/86  04/24/18 128/72    Wt Readings from Last 3 Encounters:  06/30/18 215  lb (97.5 kg)  04/27/18 210 lb (95.3 kg)  04/24/18 207 lb (93.9 kg)    Physical Exam  Constitutional: He is oriented to person, place, and time. He appears well-developed. No distress.  NAD  HENT:  Mouth/Throat: Oropharynx is clear and moist.  Eyes: Pupils are equal, round, and reactive to light. Conjunctivae are normal.  Neck: Normal range of motion. No JVD present. No thyromegaly present.  Cardiovascular: Normal rate, regular rhythm, normal heart sounds and intact distal pulses. Exam reveals no gallop and no friction rub.  No murmur heard. Pulmonary/Chest: Effort normal and  breath sounds normal. No respiratory distress. He has no wheezes. He has no rales. He exhibits no tenderness.  Abdominal: Soft. Bowel sounds are normal. He exhibits no distension and no mass. There is no tenderness. There is no rebound and no guarding.  Musculoskeletal: Normal range of motion. He exhibits tenderness. He exhibits no edema.  Lymphadenopathy:    He has no cervical adenopathy.  Neurological: He is alert and oriented to person, place, and time. He displays abnormal reflex. No cranial nerve deficit. He exhibits normal muscle tone. He displays a negative Romberg sign. Coordination abnormal. Gait normal.  Skin: Skin is warm and dry. No rash noted.  Psychiatric: He has a normal mood and affect. His behavior is normal. Judgment and thought content normal.  walker Trace edema Ataxic  Lab Results  Component Value Date   WBC 5.0 04/14/2017   HGB 11.9 (L) 04/14/2017   HCT 35.7 (L) 04/14/2017   PLT 175 04/14/2017   GLUCOSE 150 (H) 01/25/2018   CHOL 152 09/24/2015   TRIG 151.0 (H) 09/24/2015   HDL 34.10 (L) 09/24/2015   LDLCALC 88 09/24/2015   ALT 23 09/24/2015   AST 19 09/24/2015   NA 142 01/25/2018   K 4.6 01/25/2018   CL 103 01/25/2018   CREATININE 1.74 (H) 01/25/2018   BUN 26 (H) 01/25/2018   CO2 31 01/25/2018   TSH 2.54 01/25/2018   PSA 15.38 (H) 09/29/2012   INR 0.96 04/14/2017   HGBA1C 6.4 01/25/2018    Nm Brain Imaging W/spect (datscan)  Result Date: 04/20/2018 CLINICAL DATA:  Slight tremor of the hands. Difficulty holding objects. Poor gait. Weakness in the legs. EXAM: NUCLEAR MEDICINE BRAIN IMAGING WITH SPECT  (DaTscan ) TECHNIQUE: SPECT images of the brain were obtained after intravenous injection of radiopharmaceutical. 4 hour post injection imaging. Appropriate positioning. 0.8 ml lugols solution administered in a.m RADIOPHARMACEUTICALS:  4.8 millicuries I 469 Ioflupane COMPARISON:  None. FINDINGS: Some difficulty in positioning patient.  Study is count poor. The  LEFT and RIGHT putamen are poorly demonstrated. There is symmetric activity in the caudate heads which is irregular. The normal comma shaped of the striata is lost. IMPRESSION: Decreased radiotracer activity within the LEFT and RIGHT putamen and irregular activity irregular shape of the caudate heads is favored to represent a Parkinson's type syndrome pattern. Electronically Signed   By: Suzy Bouchard M.D.   On: 04/20/2018 15:27    Assessment & Plan:   There are no diagnoses linked to this encounter.   No orders of the defined types were placed in this encounter.    Follow-up: No follow-ups on file.  Walker Kehr, MD

## 2018-06-30 NOTE — Assessment & Plan Note (Signed)
Eliquis 

## 2018-06-30 NOTE — Assessment & Plan Note (Signed)
Gabapentin prn Declined Tramadol

## 2018-06-30 NOTE — Assessment & Plan Note (Signed)
Sinemet Done w/PT

## 2018-06-30 NOTE — Assessment & Plan Note (Signed)
F/u w/Urology 

## 2018-07-03 DIAGNOSIS — E1122 Type 2 diabetes mellitus with diabetic chronic kidney disease: Secondary | ICD-10-CM | POA: Diagnosis not present

## 2018-07-03 DIAGNOSIS — N183 Chronic kidney disease, stage 3 (moderate): Secondary | ICD-10-CM | POA: Diagnosis not present

## 2018-07-03 DIAGNOSIS — I129 Hypertensive chronic kidney disease with stage 1 through stage 4 chronic kidney disease, or unspecified chronic kidney disease: Secondary | ICD-10-CM | POA: Diagnosis not present

## 2018-07-03 DIAGNOSIS — R627 Adult failure to thrive: Secondary | ICD-10-CM | POA: Diagnosis not present

## 2018-07-03 DIAGNOSIS — G20 Parkinson's disease: Secondary | ICD-10-CM | POA: Diagnosis not present

## 2018-07-03 DIAGNOSIS — M48062 Spinal stenosis, lumbar region with neurogenic claudication: Secondary | ICD-10-CM | POA: Diagnosis not present

## 2018-07-05 DIAGNOSIS — M48062 Spinal stenosis, lumbar region with neurogenic claudication: Secondary | ICD-10-CM | POA: Diagnosis not present

## 2018-07-05 DIAGNOSIS — I129 Hypertensive chronic kidney disease with stage 1 through stage 4 chronic kidney disease, or unspecified chronic kidney disease: Secondary | ICD-10-CM | POA: Diagnosis not present

## 2018-07-05 DIAGNOSIS — G20 Parkinson's disease: Secondary | ICD-10-CM | POA: Diagnosis not present

## 2018-07-05 DIAGNOSIS — E1122 Type 2 diabetes mellitus with diabetic chronic kidney disease: Secondary | ICD-10-CM | POA: Diagnosis not present

## 2018-07-05 DIAGNOSIS — R627 Adult failure to thrive: Secondary | ICD-10-CM | POA: Diagnosis not present

## 2018-07-05 DIAGNOSIS — N183 Chronic kidney disease, stage 3 (moderate): Secondary | ICD-10-CM | POA: Diagnosis not present

## 2018-07-06 DIAGNOSIS — G20 Parkinson's disease: Secondary | ICD-10-CM | POA: Diagnosis not present

## 2018-07-06 DIAGNOSIS — M48062 Spinal stenosis, lumbar region with neurogenic claudication: Secondary | ICD-10-CM | POA: Diagnosis not present

## 2018-07-06 DIAGNOSIS — N183 Chronic kidney disease, stage 3 (moderate): Secondary | ICD-10-CM | POA: Diagnosis not present

## 2018-07-06 DIAGNOSIS — I129 Hypertensive chronic kidney disease with stage 1 through stage 4 chronic kidney disease, or unspecified chronic kidney disease: Secondary | ICD-10-CM | POA: Diagnosis not present

## 2018-07-06 DIAGNOSIS — R627 Adult failure to thrive: Secondary | ICD-10-CM | POA: Diagnosis not present

## 2018-07-06 DIAGNOSIS — E1122 Type 2 diabetes mellitus with diabetic chronic kidney disease: Secondary | ICD-10-CM | POA: Diagnosis not present

## 2018-07-10 DIAGNOSIS — G20 Parkinson's disease: Secondary | ICD-10-CM | POA: Diagnosis not present

## 2018-07-10 DIAGNOSIS — E1122 Type 2 diabetes mellitus with diabetic chronic kidney disease: Secondary | ICD-10-CM | POA: Diagnosis not present

## 2018-07-10 DIAGNOSIS — R627 Adult failure to thrive: Secondary | ICD-10-CM | POA: Diagnosis not present

## 2018-07-10 DIAGNOSIS — I129 Hypertensive chronic kidney disease with stage 1 through stage 4 chronic kidney disease, or unspecified chronic kidney disease: Secondary | ICD-10-CM | POA: Diagnosis not present

## 2018-07-10 DIAGNOSIS — N183 Chronic kidney disease, stage 3 (moderate): Secondary | ICD-10-CM | POA: Diagnosis not present

## 2018-07-10 DIAGNOSIS — M48062 Spinal stenosis, lumbar region with neurogenic claudication: Secondary | ICD-10-CM | POA: Diagnosis not present

## 2018-07-12 DIAGNOSIS — E1122 Type 2 diabetes mellitus with diabetic chronic kidney disease: Secondary | ICD-10-CM | POA: Diagnosis not present

## 2018-07-12 DIAGNOSIS — I129 Hypertensive chronic kidney disease with stage 1 through stage 4 chronic kidney disease, or unspecified chronic kidney disease: Secondary | ICD-10-CM | POA: Diagnosis not present

## 2018-07-12 DIAGNOSIS — M48062 Spinal stenosis, lumbar region with neurogenic claudication: Secondary | ICD-10-CM | POA: Diagnosis not present

## 2018-07-12 DIAGNOSIS — R627 Adult failure to thrive: Secondary | ICD-10-CM | POA: Diagnosis not present

## 2018-07-12 DIAGNOSIS — N183 Chronic kidney disease, stage 3 (moderate): Secondary | ICD-10-CM | POA: Diagnosis not present

## 2018-07-12 DIAGNOSIS — G20 Parkinson's disease: Secondary | ICD-10-CM | POA: Diagnosis not present

## 2018-07-13 DIAGNOSIS — M48062 Spinal stenosis, lumbar region with neurogenic claudication: Secondary | ICD-10-CM | POA: Diagnosis not present

## 2018-07-13 DIAGNOSIS — E1122 Type 2 diabetes mellitus with diabetic chronic kidney disease: Secondary | ICD-10-CM | POA: Diagnosis not present

## 2018-07-13 DIAGNOSIS — N183 Chronic kidney disease, stage 3 (moderate): Secondary | ICD-10-CM | POA: Diagnosis not present

## 2018-07-13 DIAGNOSIS — I129 Hypertensive chronic kidney disease with stage 1 through stage 4 chronic kidney disease, or unspecified chronic kidney disease: Secondary | ICD-10-CM | POA: Diagnosis not present

## 2018-07-13 DIAGNOSIS — G20 Parkinson's disease: Secondary | ICD-10-CM | POA: Diagnosis not present

## 2018-07-13 DIAGNOSIS — R627 Adult failure to thrive: Secondary | ICD-10-CM | POA: Diagnosis not present

## 2018-08-04 DIAGNOSIS — C61 Malignant neoplasm of prostate: Secondary | ICD-10-CM | POA: Diagnosis not present
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
## 2018-09-12 ENCOUNTER — Encounter: Payer: Self-pay | Admitting: Neurology

## 2018-09-12 ENCOUNTER — Ambulatory Visit (INDEPENDENT_AMBULATORY_CARE_PROVIDER_SITE_OTHER): Payer: Medicare Other | Admitting: Neurology

## 2018-09-12 VITALS — BP 132/60 | HR 78 | Ht 73.0 in | Wt 210.0 lb

## 2018-09-12 DIAGNOSIS — G20 Parkinson's disease: Secondary | ICD-10-CM | POA: Diagnosis not present

## 2018-09-12 DIAGNOSIS — R413 Other amnesia: Secondary | ICD-10-CM

## 2018-09-12 DIAGNOSIS — G20C Parkinsonism, unspecified: Secondary | ICD-10-CM

## 2018-09-12 DIAGNOSIS — M21372 Foot drop, left foot: Secondary | ICD-10-CM | POA: Diagnosis not present

## 2018-09-12 MED ORDER — CARBIDOPA-LEVODOPA 25-100 MG PO TABS: ORAL_TABLET | ORAL | 1 refills | Status: DC

## 2018-09-12 NOTE — Patient Instructions (Addendum)
1. Increase Carbidopa Levodopa: 2 in the morning, 1 in the afternoon, 1 in the evening for one week THEN 2 in the morning, 2 in the afternoon, 1 in the evening - stay on this dose.   2. You have been referred for a neurocognitive evaluation in our office.   The evaluation has two parts.   . The first part of the evaluation is a clinical interview with the neuropsychologist (Dr. Macarthur Critchley). Please bring someone with you to this appointment if possible, as it is helpful for Dr. Si Raider to hear from both you and another adult who knows you well.   . The second part of the evaluation is testing with Dr. Marcia Brash technician Hinton Dyer). The testing includes a variety of tasks- mostly question-and-answer, some paper-and-pencil. There is nothing you need to do to prepare for this appointment, but having a good night's sleep prior to the testing, and bringing eyeglasses and hearing aids (if you wear them), is advised.   Please note: We have to reserve several hours of the neuropsychologist's time and the psychometrician's time for your evaluation appointment. As such, please note that there is a No-Show fee of $100. If you are unable to attend any of your appointments, please contact our office as soon as possible to reschedule.  3. No driving

## 2018-10-05 ENCOUNTER — Ambulatory Visit: Payer: Medicare Other | Admitting: Internal Medicine

## 2018-10-11 ENCOUNTER — Encounter: Payer: Self-pay | Admitting: Internal Medicine

## 2018-10-11 ENCOUNTER — Ambulatory Visit (INDEPENDENT_AMBULATORY_CARE_PROVIDER_SITE_OTHER): Payer: Medicare Other | Admitting: Internal Medicine

## 2018-10-11 ENCOUNTER — Other Ambulatory Visit (INDEPENDENT_AMBULATORY_CARE_PROVIDER_SITE_OTHER): Payer: Medicare Other

## 2018-10-11 VITALS — BP 138/74 | HR 87 | Temp 98.0°F | Ht 73.0 in | Wt 210.0 lb

## 2018-10-11 DIAGNOSIS — C61 Malignant neoplasm of prostate: Secondary | ICD-10-CM

## 2018-10-11 DIAGNOSIS — E1122 Type 2 diabetes mellitus with diabetic chronic kidney disease: Secondary | ICD-10-CM

## 2018-10-11 DIAGNOSIS — I82412 Acute embolism and thrombosis of left femoral vein: Secondary | ICD-10-CM | POA: Diagnosis not present

## 2018-10-11 DIAGNOSIS — I10 Essential (primary) hypertension: Secondary | ICD-10-CM | POA: Diagnosis not present

## 2018-10-11 DIAGNOSIS — Z23 Encounter for immunization: Secondary | ICD-10-CM

## 2018-10-11 DIAGNOSIS — E119 Type 2 diabetes mellitus without complications: Secondary | ICD-10-CM | POA: Diagnosis not present

## 2018-10-11 DIAGNOSIS — N183 Chronic kidney disease, stage 3 (moderate): Secondary | ICD-10-CM | POA: Diagnosis not present

## 2018-10-11 DIAGNOSIS — G8929 Other chronic pain: Secondary | ICD-10-CM | POA: Diagnosis not present

## 2018-10-11 DIAGNOSIS — M545 Low back pain, unspecified: Secondary | ICD-10-CM

## 2018-10-11 DIAGNOSIS — R269 Unspecified abnormalities of gait and mobility: Secondary | ICD-10-CM | POA: Diagnosis not present

## 2018-10-11 DIAGNOSIS — S3992XA Unspecified injury of lower back, initial encounter: Secondary | ICD-10-CM | POA: Diagnosis not present

## 2018-10-11 DIAGNOSIS — G20 Parkinson's disease: Secondary | ICD-10-CM

## 2018-10-11 LAB — BASIC METABOLIC PANEL
BUN: 25 mg/dL — AB (ref 6–23)
CO2: 26 meq/L (ref 19–32)
Calcium: 9.2 mg/dL (ref 8.4–10.5)
Chloride: 102 mEq/L (ref 96–112)
Creatinine, Ser: 1.68 mg/dL — ABNORMAL HIGH (ref 0.40–1.50)
GFR: 50.39 mL/min — ABNORMAL LOW (ref >60.00)
GLUCOSE: 137 mg/dL — AB (ref 70–99)
POTASSIUM: 4.4 meq/L (ref 3.5–5.1)
SODIUM: 140 meq/L (ref 135–145)

## 2018-10-11 LAB — HEMOGLOBIN A1C: Hgb A1c MFr Bld: 6.1 % (ref 4.6–6.5)

## 2018-10-11 MED ORDER — NIACIN ER (ANTIHYPERLIPIDEMIC) 500 MG PO TBCR: 500.0000 mg | EXTENDED_RELEASE_TABLET | Freq: Every day | ORAL | 3 refills | Status: DC

## 2018-10-11 MED ORDER — VITAMIN D (ERGOCALCIFEROL) 1.25 MG (50000 UNIT) PO CAPS: 50000.0000 [IU] | ORAL_CAPSULE | ORAL | 3 refills | Status: DC

## 2018-10-11 MED ORDER — FOLIC ACID 1 MG PO TABS: 1.0000 mg | ORAL_TABLET | Freq: Every day | ORAL | 3 refills | Status: DC

## 2018-10-11 MED ORDER — METFORMIN HCL 500 MG PO TABS: 500.0000 mg | ORAL_TABLET | Freq: Two times a day (BID) | ORAL | 3 refills | Status: DC

## 2018-10-11 MED ORDER — GABAPENTIN 100 MG PO CAPS: 100.0000 mg | ORAL_CAPSULE | Freq: Every day | ORAL | 3 refills | Status: DC

## 2018-10-11 MED ORDER — OLMESARTAN MEDOXOMIL 40 MG PO TABS: 40.0000 mg | ORAL_TABLET | Freq: Every day | ORAL | 3 refills | Status: DC

## 2018-10-11 MED ORDER — APIXABAN 5 MG PO TABS: 5.0000 mg | ORAL_TABLET | Freq: Two times a day (BID) | ORAL | 3 refills | Status: DC

## 2018-10-11 NOTE — Assessment & Plan Note (Signed)
Walker use Declined PT

## 2018-10-11 NOTE — Assessment & Plan Note (Signed)
Not driving any longer

## 2018-10-11 NOTE — Assessment & Plan Note (Signed)
[place] 

## 2018-10-11 NOTE — Assessment & Plan Note (Signed)
-   Sinemet 

## 2018-10-11 NOTE — Patient Instructions (Signed)
MC well w/Jill 

## 2018-10-11 NOTE — Addendum Note (Signed)
Addended by: Karren Cobble on: 10/11/2018 10:28 AM   Modules accepted: Orders
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
## 2018-10-11 NOTE — Assessment & Plan Note (Signed)
Gabapentin 100 mg at hs

## 2018-10-11 NOTE — Assessment & Plan Note (Signed)
Eliquis 

## 2018-10-11 NOTE — Assessment & Plan Note (Signed)
F/u w/Urology 

## 2018-10-11 NOTE — Assessment & Plan Note (Signed)
Labs

## 2018-10-11 NOTE — Assessment & Plan Note (Signed)
Metformin 

## 2018-11-01 ENCOUNTER — Telehealth: Payer: Self-pay | Admitting: Neurology

## 2018-11-01 NOTE — Telephone Encounter (Signed)
Dr Bailar is leaving our office to take a job closer to where she lives. We have mailed a letter to patient to let her know we have canceled all appointments with Bailar. Thank you for your understanding °

## 2018-11-27 ENCOUNTER — Telehealth: Payer: Self-pay | Admitting: Neurology

## 2018-11-27 NOTE — Telephone Encounter (Signed)
Left message regarding letter (Dr. Si Raider leaving). Explained that we would be happy refer patient to another neuropsychologist ([facility] or [facility] in [place]). Asked to call us if wanting to proceed with referral or if had any questions.
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
## 2019-01-26 ENCOUNTER — Ambulatory Visit (INDEPENDENT_AMBULATORY_CARE_PROVIDER_SITE_OTHER): Payer: Medicare Other | Admitting: Neurology

## 2019-01-26 ENCOUNTER — Encounter: Payer: Self-pay | Admitting: Neurology

## 2019-01-26 VITALS — BP 154/84 | HR 76 | Ht 73.0 in | Wt 209.0 lb

## 2019-01-26 DIAGNOSIS — M21372 Foot drop, left foot: Secondary | ICD-10-CM | POA: Diagnosis not present

## 2019-01-26 DIAGNOSIS — G20 Parkinson's disease: Secondary | ICD-10-CM | POA: Diagnosis not present

## 2019-01-26 MED ORDER — CARBIDOPA-LEVODOPA 25-100 MG PO TABS: ORAL_TABLET | ORAL | 1 refills | Status: DC

## 2019-01-26 NOTE — Patient Instructions (Signed)
1.  Let me know if you want me to schedule the EMG (nerve test we discussed)  2.  We will send Physical and speech therapy to the home to work with you  The physicians and staff at [facility] are committed to providing excellent care. You may receive a survey requesting feedback about your experience at our office. We strive to receive "very good" responses to the survey questions. If you feel that your experience would prevent you from giving the office a "very good " response, please contact our office to try to remedy the situation. We may be reached at [phone]. Thank you for taking the time out of your busy day to complete the survey.

## 2019-01-30 DIAGNOSIS — E1122 Type 2 diabetes mellitus with diabetic chronic kidney disease: Secondary | ICD-10-CM | POA: Diagnosis not present

## 2019-01-30 DIAGNOSIS — Z7984 Long term (current) use of oral hypoglycemic drugs: Secondary | ICD-10-CM | POA: Diagnosis not present

## 2019-01-30 DIAGNOSIS — N183 Chronic kidney disease, stage 3 (moderate): Secondary | ICD-10-CM | POA: Diagnosis not present

## 2019-01-30 DIAGNOSIS — Z9181 History of falling: Secondary | ICD-10-CM | POA: Diagnosis not present

## 2019-01-30 DIAGNOSIS — R471 Dysarthria and anarthria: Secondary | ICD-10-CM | POA: Diagnosis not present

## 2019-01-30 DIAGNOSIS — Z7901 Long term (current) use of anticoagulants: Secondary | ICD-10-CM | POA: Diagnosis not present

## 2019-01-30 DIAGNOSIS — Z86718 Personal history of other venous thrombosis and embolism: Secondary | ICD-10-CM | POA: Diagnosis not present

## 2019-01-30 DIAGNOSIS — R41841 Cognitive communication deficit: Secondary | ICD-10-CM | POA: Diagnosis not present

## 2019-01-30 DIAGNOSIS — Z7982 Long term (current) use of aspirin: Secondary | ICD-10-CM | POA: Diagnosis not present

## 2019-01-30 DIAGNOSIS — M48061 Spinal stenosis, lumbar region without neurogenic claudication: Secondary | ICD-10-CM | POA: Diagnosis not present

## 2019-01-30 DIAGNOSIS — G20 Parkinson's disease: Secondary | ICD-10-CM | POA: Diagnosis not present

## 2019-01-30 DIAGNOSIS — I129 Hypertensive chronic kidney disease with stage 1 through stage 4 chronic kidney disease, or unspecified chronic kidney disease: Secondary | ICD-10-CM | POA: Diagnosis not present

## 2019-01-30 DIAGNOSIS — M1711 Unilateral primary osteoarthritis, right knee: Secondary | ICD-10-CM | POA: Diagnosis not present

## 2019-01-31 ENCOUNTER — Telehealth: Payer: Self-pay | Admitting: Neurology

## 2019-01-31 DIAGNOSIS — R41841 Cognitive communication deficit: Secondary | ICD-10-CM | POA: Diagnosis not present

## 2019-01-31 DIAGNOSIS — G20 Parkinson's disease: Secondary | ICD-10-CM | POA: Diagnosis not present

## 2019-01-31 DIAGNOSIS — M48061 Spinal stenosis, lumbar region without neurogenic claudication: Secondary | ICD-10-CM | POA: Diagnosis not present

## 2019-01-31 DIAGNOSIS — M1711 Unilateral primary osteoarthritis, right knee: Secondary | ICD-10-CM | POA: Diagnosis not present

## 2019-01-31 DIAGNOSIS — I129 Hypertensive chronic kidney disease with stage 1 through stage 4 chronic kidney disease, or unspecified chronic kidney disease: Secondary | ICD-10-CM | POA: Diagnosis not present

## 2019-01-31 DIAGNOSIS — R471 Dysarthria and anarthria: Secondary | ICD-10-CM | POA: Diagnosis not present

## 2019-01-31 NOTE — Telephone Encounter (Signed)
Edward [name] called needing to get Edward [name] for 2 x a week for 6 Weeks. Please Call. Thanks

## 2019-01-31 NOTE — Telephone Encounter (Signed)
Spoke with Sonia Baller and gave verbal orders for home health.

## 2019-01-31 NOTE — Telephone Encounter (Signed)
Deirdre from [facility] called needing Verbal orders for this patient. She said 1x1 2x4, Speech Therapy, and Cognitive Impairment, Disphasia. Please Call. Thanks

## 2019-02-01 NOTE — Telephone Encounter (Signed)
Spoke with Deidra and gave verbal orders for her to see patient.

## 2019-02-01 NOTE — Telephone Encounter (Addendum)
Left message on machine for Deidra to call back.

## 2019-02-02 DIAGNOSIS — M48061 Spinal stenosis, lumbar region without neurogenic claudication: Secondary | ICD-10-CM | POA: Diagnosis not present

## 2019-02-02 DIAGNOSIS — I129 Hypertensive chronic kidney disease with stage 1 through stage 4 chronic kidney disease, or unspecified chronic kidney disease: Secondary | ICD-10-CM | POA: Diagnosis not present

## 2019-02-02 DIAGNOSIS — R471 Dysarthria and anarthria: Secondary | ICD-10-CM | POA: Diagnosis not present

## 2019-02-02 DIAGNOSIS — G20 Parkinson's disease: Secondary | ICD-10-CM | POA: Diagnosis not present

## 2019-02-02 DIAGNOSIS — R41841 Cognitive communication deficit: Secondary | ICD-10-CM | POA: Diagnosis not present

## 2019-02-02 DIAGNOSIS — M1711 Unilateral primary osteoarthritis, right knee: Secondary | ICD-10-CM | POA: Diagnosis not present

## 2019-02-05 ENCOUNTER — Encounter: Payer: Self-pay | Admitting: Internal Medicine

## 2019-02-05 ENCOUNTER — Other Ambulatory Visit (INDEPENDENT_AMBULATORY_CARE_PROVIDER_SITE_OTHER): Payer: Medicare Other

## 2019-02-05 ENCOUNTER — Ambulatory Visit (INDEPENDENT_AMBULATORY_CARE_PROVIDER_SITE_OTHER): Payer: Medicare Other | Admitting: Internal Medicine

## 2019-02-05 DIAGNOSIS — R41841 Cognitive communication deficit: Secondary | ICD-10-CM | POA: Diagnosis not present

## 2019-02-05 DIAGNOSIS — I129 Hypertensive chronic kidney disease with stage 1 through stage 4 chronic kidney disease, or unspecified chronic kidney disease: Secondary | ICD-10-CM | POA: Diagnosis not present

## 2019-02-05 DIAGNOSIS — G20 Parkinson's disease: Secondary | ICD-10-CM

## 2019-02-05 DIAGNOSIS — I10 Essential (primary) hypertension: Secondary | ICD-10-CM

## 2019-02-05 DIAGNOSIS — N183 Chronic kidney disease, stage 3 unspecified: Secondary | ICD-10-CM

## 2019-02-05 DIAGNOSIS — E119 Type 2 diabetes mellitus without complications: Secondary | ICD-10-CM

## 2019-02-05 DIAGNOSIS — E1122 Type 2 diabetes mellitus with diabetic chronic kidney disease: Secondary | ICD-10-CM

## 2019-02-05 DIAGNOSIS — M1711 Unilateral primary osteoarthritis, right knee: Secondary | ICD-10-CM | POA: Diagnosis not present

## 2019-02-05 DIAGNOSIS — R269 Unspecified abnormalities of gait and mobility: Secondary | ICD-10-CM

## 2019-02-05 DIAGNOSIS — R471 Dysarthria and anarthria: Secondary | ICD-10-CM | POA: Diagnosis not present

## 2019-02-05 DIAGNOSIS — M48061 Spinal stenosis, lumbar region without neurogenic claudication: Secondary | ICD-10-CM | POA: Diagnosis not present

## 2019-02-05 LAB — BASIC METABOLIC PANEL
BUN: 25 mg/dL — ABNORMAL HIGH (ref 6–23)
CALCIUM: 9 mg/dL (ref 8.4–10.5)
CO2: 27 mEq/L (ref 19–32)
Chloride: 103 mEq/L (ref 96–112)
Creatinine, Ser: 1.57 mg/dL — ABNORMAL HIGH (ref 0.40–1.50)
GFR: 51.22 mL/min — AB (ref >60.00)
Glucose, Bld: 114 mg/dL — ABNORMAL HIGH (ref 70–99)
POTASSIUM: 4.3 meq/L (ref 3.5–5.1)
SODIUM: 141 meq/L (ref 135–145)

## 2019-02-05 LAB — HEMOGLOBIN A1C: HEMOGLOBIN A1C: 6.1 % (ref 4.6–6.5)

## 2019-02-05 NOTE — Assessment & Plan Note (Signed)
Use a walker PT at home

## 2019-02-05 NOTE — Progress Notes (Signed)
Subjective:  Patient ID: Edward [name], male    DOB: 1933/11/13  Age: 85 y.o. MRN: [id number]  CC: No chief complaint on file.   HPI MERCED [name] presents for gait disorder, HTN, DM Not driving In PT, ST at home  Outpatient Medications Prior to Visit  Medication Sig Dispense Refill  . apixaban (ELIQUIS) 5 MG TABS tablet Take 1 tablet (5 mg total) by mouth 2 (two) times daily. 180 tablet 3  . aspirin EC 81 MG tablet Take 81 mg by mouth daily.    . brimonidine (ALPHAGAN P) 0.1 % SOLN Place 1 drop into both eyes 2 (two) times daily.    . carbidopa-levodopa (SINEMET IR) 25-100 MG tablet 2 in the morning, 2 in the afternoon, 1 in the evening 833 tablet 1  . folic acid (FOLVITE) 1 MG tablet Take 1 tablet (1 mg total) by mouth daily. 90 tablet 3  . furosemide (LASIX) 20 MG tablet Take 20-40 mgs by mouth 3 times per week as needed for leg swelling 90 tablet 3  . gabapentin (NEURONTIN) 100 MG capsule Take 1 capsule (100 mg total) by mouth at bedtime. 90 capsule 3  . latanoprost (XALATAN) 0.005 % ophthalmic solution Place 1 drop into both eyes at bedtime.    . metFORMIN (GLUCOPHAGE) 500 MG tablet Take 1 tablet (500 mg total) by mouth 2 (two) times daily with a meal. 180 tablet 3  . Multiple Vitamins-Minerals (CENTRUM SILVER) tablet Take 1 tablet by mouth daily. Reported on 05/25/2016    . niacin (NIASPAN) 500 MG CR tablet Take 1 tablet (500 mg total) by mouth daily. 90 tablet 3  . olmesartan (BENICAR) 40 MG tablet Take 1 tablet (40 mg total) by mouth daily. 90 tablet 3  . vitamin B-12 (CYANOCOBALAMIN) 1000 MCG tablet Take 1,000 mcg by mouth daily.    . Vitamin D, Ergocalciferol, (DRISDOL) 50000 units CAPS capsule Take 1 capsule (50,000 Units total) by mouth every 30 (thirty) days. First of the month 3 capsule 3  . candesartan (ATACAND) 32 MG tablet Take 1 tablet (32 mg total) by mouth daily. (Patient not taking: Reported on 02/05/2019) 90 tablet 3  . pyridOXINE (VITAMIN B-6) 100 MG tablet Take  200 mg by mouth daily.     No facility-administered medications prior to visit.     ROS: Review of Systems  Constitutional: Positive for fatigue. Negative for appetite change and unexpected weight change.  HENT: Negative for congestion, nosebleeds, sneezing, sore throat and trouble swallowing.   Eyes: Negative for itching and visual disturbance.  Respiratory: Negative for cough.   Cardiovascular: Negative for chest pain, palpitations and leg swelling.  Gastrointestinal: Negative for abdominal distention, blood in stool, diarrhea and nausea.  Genitourinary: Negative for frequency and hematuria.  Musculoskeletal: Positive for arthralgias, back pain and gait problem. Negative for joint swelling and neck pain.  Skin: Negative for rash.  Neurological: Positive for tremors and weakness. Negative for dizziness and speech difficulty.  Psychiatric/Behavioral: Positive for dysphoric mood. Negative for agitation, sleep disturbance and suicidal ideas. The patient is nervous/anxious.     Objective:  BP (!) 148/76 (BP Location: Right Arm, Patient Position: Sitting, Cuff Size: Large)   Pulse 85   Temp 98 F (36.7 C) (Oral)   Ht 6\' 1"  (1.854 m)   Wt 210 lb (95.3 kg)   SpO2 96%   BMI 27.71 kg/m   BP Readings from Last 3 Encounters:  02/05/19 (!) 148/76  01/26/19 (!) 154/84  10/11/18 138/74  Wt Readings from Last 3 Encounters:  02/05/19 210 lb (95.3 kg)  01/26/19 209 lb (94.8 kg)  10/11/18 210 lb (95.3 kg)    Physical Exam Constitutional:      General: He is not in acute distress.    Appearance: He is well-developed.     Comments: NAD  Eyes:     Conjunctiva/sclera: Conjunctivae normal.     Pupils: Pupils are equal, round, and reactive to light.  Neck:     Musculoskeletal: Normal range of motion.     Thyroid: No thyromegaly.     Vascular: No JVD.  Cardiovascular:     Rate and Rhythm: Normal rate and regular rhythm.     Heart sounds: Normal heart sounds. No murmur. No friction  rub. No gallop.   Pulmonary:     Effort: Pulmonary effort is normal. No respiratory distress.     Breath sounds: Normal breath sounds. No wheezing or rales.  Chest:     Chest wall: No tenderness.  Abdominal:     General: Bowel sounds are normal. There is no distension.     Palpations: Abdomen is soft. There is no mass.     Tenderness: There is no abdominal tenderness. There is no guarding or rebound.  Musculoskeletal: Normal range of motion.        General: Tenderness present.  Lymphadenopathy:     Cervical: No cervical adenopathy.  Skin:    General: Skin is warm and dry.     Findings: No rash.  Neurological:     Mental Status: He is alert and oriented to person, place, and time.     Cranial Nerves: No cranial nerve deficit.     Motor: Weakness present. No abnormal muscle tone.     Coordination: Coordination abnormal.     Gait: Gait abnormal.     Deep Tendon Reflexes: Reflexes are normal and symmetric.  Psychiatric:        Mood and Affect: Mood normal.        Behavior: Behavior normal.        Thought Content: Thought content normal.        Judgment: Judgment normal.   mild tremor A walker  Lab Results  Component Value Date   WBC 5.0 04/14/2017   HGB 11.9 (L) 04/14/2017   HCT 35.7 (L) 04/14/2017   PLT 175 04/14/2017   GLUCOSE 137 (H) 10/11/2018   CHOL 152 09/24/2015   TRIG 151.0 (H) 09/24/2015   HDL 34.10 (L) 09/24/2015   LDLCALC 88 09/24/2015   ALT 23 09/24/2015   AST 19 09/24/2015   NA 140 10/11/2018   K 4.4 10/11/2018   CL 102 10/11/2018   CREATININE 1.68 (H) 10/11/2018   BUN 25 (H) 10/11/2018   CO2 26 10/11/2018   TSH 2.54 01/25/2018   PSA 15.38 (H) 09/29/2012   INR 0.96 04/14/2017   HGBA1C 6.1 10/11/2018    Nm Brain Imaging W/spect (datscan)  Result Date: 04/20/2018 CLINICAL DATA:  Slight tremor of the hands. Difficulty holding objects. Poor gait. Weakness in the legs. EXAM: NUCLEAR MEDICINE BRAIN IMAGING WITH SPECT  (DaTscan ) TECHNIQUE: SPECT images of  the brain were obtained after intravenous injection of radiopharmaceutical. 4 hour post injection imaging. Appropriate positioning. 0.8 ml lugols solution administered in a.m RADIOPHARMACEUTICALS:  4.8 millicuries I 502 Ioflupane COMPARISON:  None. FINDINGS: Some difficulty in positioning patient.  Study is count poor. The LEFT and RIGHT putamen are poorly demonstrated. There is symmetric activity in the caudate heads which  is irregular. The normal comma shaped of the striata is lost. IMPRESSION: Decreased radiotracer activity within the LEFT and RIGHT putamen and irregular activity irregular shape of the caudate heads is favored to represent a Parkinson's type syndrome pattern. Electronically Signed   By: Suzy Bouchard M.D.   On: 04/20/2018 15:27    Assessment & Plan:   There are no diagnoses linked to this encounter.   No orders of the defined types were placed in this encounter.    Follow-up: No follow-ups on file.  Walker Kehr, MD

## 2019-02-05 NOTE — Assessment & Plan Note (Signed)
Metformin 

## 2019-02-05 NOTE — Assessment & Plan Note (Signed)
back on Benicar Sup-optimal control

## 2019-02-05 NOTE — Assessment & Plan Note (Signed)
Sinemet Gilford Rile

## 2019-02-06 ENCOUNTER — Telehealth: Payer: Self-pay | Admitting: Neurology

## 2019-02-06 NOTE — Telephone Encounter (Signed)
Received vm from [place], [place] with [facility], [phone] asking to delay one visit from this week. Called back and gave verbal order.

## 2019-02-09 DIAGNOSIS — M1711 Unilateral primary osteoarthritis, right knee: Secondary | ICD-10-CM | POA: Diagnosis not present

## 2019-02-09 DIAGNOSIS — G20 Parkinson's disease: Secondary | ICD-10-CM | POA: Diagnosis not present

## 2019-02-09 DIAGNOSIS — M48061 Spinal stenosis, lumbar region without neurogenic claudication: Secondary | ICD-10-CM | POA: Diagnosis not present

## 2019-02-09 DIAGNOSIS — R41841 Cognitive communication deficit: Secondary | ICD-10-CM | POA: Diagnosis not present

## 2019-02-09 DIAGNOSIS — I129 Hypertensive chronic kidney disease with stage 1 through stage 4 chronic kidney disease, or unspecified chronic kidney disease: Secondary | ICD-10-CM | POA: Diagnosis not present

## 2019-02-09 DIAGNOSIS — R471 Dysarthria and anarthria: Secondary | ICD-10-CM | POA: Diagnosis not present

## 2019-02-13 DIAGNOSIS — M48061 Spinal stenosis, lumbar region without neurogenic claudication: Secondary | ICD-10-CM | POA: Diagnosis not present

## 2019-02-13 DIAGNOSIS — R41841 Cognitive communication deficit: Secondary | ICD-10-CM | POA: Diagnosis not present

## 2019-02-13 DIAGNOSIS — I129 Hypertensive chronic kidney disease with stage 1 through stage 4 chronic kidney disease, or unspecified chronic kidney disease: Secondary | ICD-10-CM | POA: Diagnosis not present

## 2019-02-13 DIAGNOSIS — R471 Dysarthria and anarthria: Secondary | ICD-10-CM | POA: Diagnosis not present

## 2019-02-13 DIAGNOSIS — G20 Parkinson's disease: Secondary | ICD-10-CM | POA: Diagnosis not present

## 2019-02-13 DIAGNOSIS — M1711 Unilateral primary osteoarthritis, right knee: Secondary | ICD-10-CM | POA: Diagnosis not present

## 2019-02-16 DIAGNOSIS — M1711 Unilateral primary osteoarthritis, right knee: Secondary | ICD-10-CM | POA: Diagnosis not present

## 2019-02-16 DIAGNOSIS — M48061 Spinal stenosis, lumbar region without neurogenic claudication: Secondary | ICD-10-CM | POA: Diagnosis not present

## 2019-02-16 DIAGNOSIS — R471 Dysarthria and anarthria: Secondary | ICD-10-CM | POA: Diagnosis not present

## 2019-02-16 DIAGNOSIS — G20 Parkinson's disease: Secondary | ICD-10-CM | POA: Diagnosis not present

## 2019-02-16 DIAGNOSIS — R41841 Cognitive communication deficit: Secondary | ICD-10-CM | POA: Diagnosis not present

## 2019-02-16 DIAGNOSIS — I129 Hypertensive chronic kidney disease with stage 1 through stage 4 chronic kidney disease, or unspecified chronic kidney disease: Secondary | ICD-10-CM | POA: Diagnosis not present

## 2019-02-20 DIAGNOSIS — M48061 Spinal stenosis, lumbar region without neurogenic claudication: Secondary | ICD-10-CM | POA: Diagnosis not present

## 2019-02-20 DIAGNOSIS — R471 Dysarthria and anarthria: Secondary | ICD-10-CM | POA: Diagnosis not present

## 2019-02-20 DIAGNOSIS — I129 Hypertensive chronic kidney disease with stage 1 through stage 4 chronic kidney disease, or unspecified chronic kidney disease: Secondary | ICD-10-CM | POA: Diagnosis not present

## 2019-02-20 DIAGNOSIS — G20 Parkinson's disease: Secondary | ICD-10-CM | POA: Diagnosis not present

## 2019-02-20 DIAGNOSIS — M1711 Unilateral primary osteoarthritis, right knee: Secondary | ICD-10-CM | POA: Diagnosis not present

## 2019-02-20 DIAGNOSIS — R41841 Cognitive communication deficit: Secondary | ICD-10-CM | POA: Diagnosis not present

## 2019-02-21 DIAGNOSIS — G20 Parkinson's disease: Secondary | ICD-10-CM | POA: Diagnosis not present

## 2019-02-21 DIAGNOSIS — M1711 Unilateral primary osteoarthritis, right knee: Secondary | ICD-10-CM | POA: Diagnosis not present

## 2019-02-21 DIAGNOSIS — I129 Hypertensive chronic kidney disease with stage 1 through stage 4 chronic kidney disease, or unspecified chronic kidney disease: Secondary | ICD-10-CM | POA: Diagnosis not present

## 2019-02-21 DIAGNOSIS — R471 Dysarthria and anarthria: Secondary | ICD-10-CM | POA: Diagnosis not present

## 2019-02-21 DIAGNOSIS — R41841 Cognitive communication deficit: Secondary | ICD-10-CM | POA: Diagnosis not present

## 2019-02-21 DIAGNOSIS — M48061 Spinal stenosis, lumbar region without neurogenic claudication: Secondary | ICD-10-CM | POA: Diagnosis not present

## 2019-02-23 DIAGNOSIS — R41841 Cognitive communication deficit: Secondary | ICD-10-CM | POA: Diagnosis not present

## 2019-02-23 DIAGNOSIS — M1711 Unilateral primary osteoarthritis, right knee: Secondary | ICD-10-CM | POA: Diagnosis not present

## 2019-02-23 DIAGNOSIS — R471 Dysarthria and anarthria: Secondary | ICD-10-CM | POA: Diagnosis not present

## 2019-02-23 DIAGNOSIS — I129 Hypertensive chronic kidney disease with stage 1 through stage 4 chronic kidney disease, or unspecified chronic kidney disease: Secondary | ICD-10-CM | POA: Diagnosis not present

## 2019-02-23 DIAGNOSIS — M48061 Spinal stenosis, lumbar region without neurogenic claudication: Secondary | ICD-10-CM | POA: Diagnosis not present

## 2019-02-23 DIAGNOSIS — G20 Parkinson's disease: Secondary | ICD-10-CM | POA: Diagnosis not present

## 2019-02-26 ENCOUNTER — Telehealth: Payer: Self-pay | Admitting: Neurology

## 2019-02-26 DIAGNOSIS — M48061 Spinal stenosis, lumbar region without neurogenic claudication: Secondary | ICD-10-CM | POA: Diagnosis not present

## 2019-02-26 DIAGNOSIS — G20 Parkinson's disease: Secondary | ICD-10-CM | POA: Diagnosis not present

## 2019-02-26 DIAGNOSIS — R471 Dysarthria and anarthria: Secondary | ICD-10-CM | POA: Diagnosis not present

## 2019-02-26 DIAGNOSIS — I129 Hypertensive chronic kidney disease with stage 1 through stage 4 chronic kidney disease, or unspecified chronic kidney disease: Secondary | ICD-10-CM | POA: Diagnosis not present

## 2019-02-26 DIAGNOSIS — M1711 Unilateral primary osteoarthritis, right knee: Secondary | ICD-10-CM | POA: Diagnosis not present

## 2019-02-26 DIAGNOSIS — R41841 Cognitive communication deficit: Secondary | ICD-10-CM | POA: Diagnosis not present

## 2019-02-26 NOTE — Telephone Encounter (Signed)
Received vm's from [facility].   Deidra [phone]) speech therapist, called to d/c patient one week early. He met all goals. Verbal okay given.   Sonia Baller [phone]) physical therapist, called to move patient's visit from 02/23/2019 to the end of care. Verbal okay given.

## 2019-02-27 DIAGNOSIS — R471 Dysarthria and anarthria: Secondary | ICD-10-CM | POA: Diagnosis not present

## 2019-02-27 DIAGNOSIS — M48061 Spinal stenosis, lumbar region without neurogenic claudication: Secondary | ICD-10-CM | POA: Diagnosis not present

## 2019-02-27 DIAGNOSIS — I129 Hypertensive chronic kidney disease with stage 1 through stage 4 chronic kidney disease, or unspecified chronic kidney disease: Secondary | ICD-10-CM | POA: Diagnosis not present

## 2019-02-27 DIAGNOSIS — G20 Parkinson's disease: Secondary | ICD-10-CM | POA: Diagnosis not present

## 2019-02-27 DIAGNOSIS — M1711 Unilateral primary osteoarthritis, right knee: Secondary | ICD-10-CM | POA: Diagnosis not present

## 2019-02-27 DIAGNOSIS — R41841 Cognitive communication deficit: Secondary | ICD-10-CM | POA: Diagnosis not present

## 2019-02-28 DIAGNOSIS — R41841 Cognitive communication deficit: Secondary | ICD-10-CM | POA: Diagnosis not present

## 2019-02-28 DIAGNOSIS — M48061 Spinal stenosis, lumbar region without neurogenic claudication: Secondary | ICD-10-CM | POA: Diagnosis not present

## 2019-02-28 DIAGNOSIS — R471 Dysarthria and anarthria: Secondary | ICD-10-CM | POA: Diagnosis not present

## 2019-02-28 DIAGNOSIS — G20 Parkinson's disease: Secondary | ICD-10-CM | POA: Diagnosis not present

## 2019-02-28 DIAGNOSIS — I129 Hypertensive chronic kidney disease with stage 1 through stage 4 chronic kidney disease, or unspecified chronic kidney disease: Secondary | ICD-10-CM | POA: Diagnosis not present

## 2019-02-28 DIAGNOSIS — M1711 Unilateral primary osteoarthritis, right knee: Secondary | ICD-10-CM | POA: Diagnosis not present

## 2019-03-01 DIAGNOSIS — M1711 Unilateral primary osteoarthritis, right knee: Secondary | ICD-10-CM | POA: Diagnosis not present

## 2019-03-01 DIAGNOSIS — E1122 Type 2 diabetes mellitus with diabetic chronic kidney disease: Secondary | ICD-10-CM | POA: Diagnosis not present

## 2019-03-01 DIAGNOSIS — Z7984 Long term (current) use of oral hypoglycemic drugs: Secondary | ICD-10-CM | POA: Diagnosis not present

## 2019-03-01 DIAGNOSIS — R41841 Cognitive communication deficit: Secondary | ICD-10-CM | POA: Diagnosis not present

## 2019-03-01 DIAGNOSIS — G20 Parkinson's disease: Secondary | ICD-10-CM | POA: Diagnosis not present

## 2019-03-01 DIAGNOSIS — N183 Chronic kidney disease, stage 3 (moderate): Secondary | ICD-10-CM | POA: Diagnosis not present

## 2019-03-01 DIAGNOSIS — Z7982 Long term (current) use of aspirin: Secondary | ICD-10-CM | POA: Diagnosis not present

## 2019-03-01 DIAGNOSIS — Z9181 History of falling: Secondary | ICD-10-CM | POA: Diagnosis not present

## 2019-03-01 DIAGNOSIS — Z7901 Long term (current) use of anticoagulants: Secondary | ICD-10-CM | POA: Diagnosis not present

## 2019-03-01 DIAGNOSIS — I129 Hypertensive chronic kidney disease with stage 1 through stage 4 chronic kidney disease, or unspecified chronic kidney disease: Secondary | ICD-10-CM | POA: Diagnosis not present

## 2019-03-01 DIAGNOSIS — Z86718 Personal history of other venous thrombosis and embolism: Secondary | ICD-10-CM | POA: Diagnosis not present

## 2019-03-01 DIAGNOSIS — M48061 Spinal stenosis, lumbar region without neurogenic claudication: Secondary | ICD-10-CM | POA: Diagnosis not present

## 2019-03-01 DIAGNOSIS — R471 Dysarthria and anarthria: Secondary | ICD-10-CM | POA: Diagnosis not present

## 2019-03-07 DIAGNOSIS — R471 Dysarthria and anarthria: Secondary | ICD-10-CM | POA: Diagnosis not present

## 2019-03-07 DIAGNOSIS — R41841 Cognitive communication deficit: Secondary | ICD-10-CM | POA: Diagnosis not present

## 2019-03-07 DIAGNOSIS — M48061 Spinal stenosis, lumbar region without neurogenic claudication: Secondary | ICD-10-CM | POA: Diagnosis not present

## 2019-03-07 DIAGNOSIS — M1711 Unilateral primary osteoarthritis, right knee: Secondary | ICD-10-CM | POA: Diagnosis not present

## 2019-03-07 DIAGNOSIS — I129 Hypertensive chronic kidney disease with stage 1 through stage 4 chronic kidney disease, or unspecified chronic kidney disease: Secondary | ICD-10-CM | POA: Diagnosis not present

## 2019-03-07 DIAGNOSIS — G20 Parkinson's disease: Secondary | ICD-10-CM | POA: Diagnosis not present

## 2019-03-09 DIAGNOSIS — I129 Hypertensive chronic kidney disease with stage 1 through stage 4 chronic kidney disease, or unspecified chronic kidney disease: Secondary | ICD-10-CM | POA: Diagnosis not present

## 2019-03-09 DIAGNOSIS — M48061 Spinal stenosis, lumbar region without neurogenic claudication: Secondary | ICD-10-CM | POA: Diagnosis not present

## 2019-03-09 DIAGNOSIS — R41841 Cognitive communication deficit: Secondary | ICD-10-CM | POA: Diagnosis not present

## 2019-03-09 DIAGNOSIS — R471 Dysarthria and anarthria: Secondary | ICD-10-CM | POA: Diagnosis not present

## 2019-03-09 DIAGNOSIS — G20 Parkinson's disease: Secondary | ICD-10-CM | POA: Diagnosis not present

## 2019-03-09 DIAGNOSIS — M1711 Unilateral primary osteoarthritis, right knee: Secondary | ICD-10-CM | POA: Diagnosis not present

## 2019-03-12 DIAGNOSIS — R41841 Cognitive communication deficit: Secondary | ICD-10-CM | POA: Diagnosis not present

## 2019-03-12 DIAGNOSIS — R471 Dysarthria and anarthria: Secondary | ICD-10-CM | POA: Diagnosis not present

## 2019-03-12 DIAGNOSIS — M1711 Unilateral primary osteoarthritis, right knee: Secondary | ICD-10-CM | POA: Diagnosis not present

## 2019-03-12 DIAGNOSIS — G20 Parkinson's disease: Secondary | ICD-10-CM | POA: Diagnosis not present

## 2019-03-12 DIAGNOSIS — M48061 Spinal stenosis, lumbar region without neurogenic claudication: Secondary | ICD-10-CM | POA: Diagnosis not present

## 2019-03-12 DIAGNOSIS — I129 Hypertensive chronic kidney disease with stage 1 through stage 4 chronic kidney disease, or unspecified chronic kidney disease: Secondary | ICD-10-CM | POA: Diagnosis not present

## 2019-03-15 ENCOUNTER — Encounter: Payer: Medicare Other | Admitting: Psychology

## 2019-06-06 ENCOUNTER — Other Ambulatory Visit (INDEPENDENT_AMBULATORY_CARE_PROVIDER_SITE_OTHER): Payer: Medicare Other

## 2019-06-06 ENCOUNTER — Encounter: Payer: Self-pay | Admitting: Internal Medicine

## 2019-06-06 ENCOUNTER — Ambulatory Visit (INDEPENDENT_AMBULATORY_CARE_PROVIDER_SITE_OTHER): Payer: Medicare Other | Admitting: Internal Medicine

## 2019-06-06 ENCOUNTER — Other Ambulatory Visit: Payer: Self-pay

## 2019-06-06 DIAGNOSIS — E1122 Type 2 diabetes mellitus with diabetic chronic kidney disease: Secondary | ICD-10-CM

## 2019-06-06 DIAGNOSIS — G20 Parkinson's disease: Secondary | ICD-10-CM

## 2019-06-06 DIAGNOSIS — N183 Chronic kidney disease, stage 3 (moderate): Secondary | ICD-10-CM | POA: Diagnosis not present

## 2019-06-06 DIAGNOSIS — E119 Type 2 diabetes mellitus without complications: Secondary | ICD-10-CM | POA: Diagnosis not present

## 2019-06-06 DIAGNOSIS — I82412 Acute embolism and thrombosis of left femoral vein: Secondary | ICD-10-CM | POA: Diagnosis not present

## 2019-06-06 DIAGNOSIS — I10 Essential (primary) hypertension: Secondary | ICD-10-CM

## 2019-06-06 LAB — BASIC METABOLIC PANEL
BUN: 25 mg/dL — ABNORMAL HIGH (ref 6–23)
CO2: 28 mEq/L (ref 19–32)
Calcium: 9.2 mg/dL (ref 8.4–10.5)
Chloride: 103 mEq/L (ref 96–112)
Creatinine, Ser: 1.47 mg/dL (ref 0.40–1.50)
GFR: 55.22 mL/min — ABNORMAL LOW (ref >60.00)
Glucose, Bld: 118 mg/dL — ABNORMAL HIGH (ref 70–99)
Potassium: 4.4 mEq/L (ref 3.5–5.1)
Sodium: 140 mEq/L (ref 135–145)

## 2019-06-06 LAB — HEMOGLOBIN A1C: Hgb A1c MFr Bld: 6.3 % (ref 4.6–6.5)

## 2019-06-06 MED ORDER — CANDESARTAN CILEXETIL 32 MG PO TABS: 32.0000 mg | ORAL_TABLET | Freq: Every day | ORAL | 3 refills | Status: DC

## 2019-06-06 NOTE — Assessment & Plan Note (Signed)
-   Sinemet 

## 2019-06-06 NOTE — Progress Notes (Signed)
Subjective:  Patient ID: Edward [name], male    DOB: 06-13-1933  Age: 85 y.o. MRN: [id number]  CC: No chief complaint on file.   HPI Edward [name] presents for HTN, CVA, leg weakness C/o Olmesartan n/a  Outpatient Medications Prior to Visit  Medication Sig Dispense Refill  . apixaban (ELIQUIS) 5 MG TABS tablet Take 1 tablet (5 mg total) by mouth 2 (two) times daily. 180 tablet 3  . aspirin EC 81 MG tablet Take 81 mg by mouth daily.    . brimonidine (ALPHAGAN P) 0.1 % SOLN Place 1 drop into both eyes 2 (two) times daily.    . carbidopa-levodopa (SINEMET IR) 25-100 MG tablet 2 in the morning, 2 in the afternoon, 1 in the evening 778 tablet 1  . folic acid (FOLVITE) 1 MG tablet Take 1 tablet (1 mg total) by mouth daily. 90 tablet 3  . furosemide (LASIX) 20 MG tablet Take 20-40 mgs by mouth 3 times per week as needed for leg swelling 90 tablet 3  . gabapentin (NEURONTIN) 100 MG capsule Take 1 capsule (100 mg total) by mouth at bedtime. 90 capsule 3  . latanoprost (XALATAN) 0.005 % ophthalmic solution Place 1 drop into both eyes at bedtime.    . metFORMIN (GLUCOPHAGE) 500 MG tablet Take 1 tablet (500 mg total) by mouth 2 (two) times daily with a meal. 180 tablet 3  . Multiple Vitamins-Minerals (CENTRUM SILVER) tablet Take 1 tablet by mouth daily. Reported on 05/25/2016    . niacin (NIASPAN) 500 MG CR tablet Take 1 tablet (500 mg total) by mouth daily. 90 tablet 3  . olmesartan (BENICAR) 40 MG tablet Take 1 tablet (40 mg total) by mouth daily. 90 tablet 3  . vitamin B-12 (CYANOCOBALAMIN) 1000 MCG tablet Take 1,000 mcg by mouth daily.    . Vitamin D, Ergocalciferol, (DRISDOL) 50000 units CAPS capsule Take 1 capsule (50,000 Units total) by mouth every 30 (thirty) days. First of the month 3 capsule 3   No facility-administered medications prior to visit.     ROS: Review of Systems  Constitutional: Negative for appetite change, fatigue and unexpected weight change.  HENT: Negative for  congestion, nosebleeds, sneezing, sore throat and trouble swallowing.   Eyes: Negative for itching and visual disturbance.  Respiratory: Negative for cough.   Cardiovascular: Negative for chest pain, palpitations and leg swelling.  Gastrointestinal: Negative for abdominal distention, blood in stool, diarrhea and nausea.  Genitourinary: Negative for frequency and hematuria.  Musculoskeletal: Positive for arthralgias, back pain and gait problem. Negative for joint swelling and neck pain.  Skin: Negative for rash.  Neurological: Positive for weakness. Negative for dizziness, tremors and speech difficulty.  Psychiatric/Behavioral: Negative for agitation, dysphoric mood and sleep disturbance. The patient is not nervous/anxious.     Objective:  BP (!) 146/80 (BP Location: Left Arm, Patient Position: Sitting, Cuff Size: Large)   Pulse 78   Temp 98.2 F (36.8 C) (Oral)   Ht 6\' 1"  (1.854 m)   Wt 213 lb (96.6 kg)   SpO2 96%   BMI 28.10 kg/m   BP Readings from Last 3 Encounters:  06/06/19 (!) 146/80  02/05/19 (!) 148/76  01/26/19 (!) 154/84    Wt Readings from Last 3 Encounters:  06/06/19 213 lb (96.6 kg)  02/05/19 210 lb (95.3 kg)  01/26/19 209 lb (94.8 kg)    Physical Exam Constitutional:      General: He is not in acute distress.    Appearance: He  is well-developed.     Comments: NAD  Eyes:     Conjunctiva/sclera: Conjunctivae normal.     Pupils: Pupils are equal, round, and reactive to light.  Neck:     Musculoskeletal: Normal range of motion.     Thyroid: No thyromegaly.     Vascular: No JVD.  Cardiovascular:     Rate and Rhythm: Normal rate and regular rhythm.     Heart sounds: Normal heart sounds. No murmur. No friction rub. No gallop.   Pulmonary:     Effort: Pulmonary effort is normal. No respiratory distress.     Breath sounds: Normal breath sounds. No wheezing or rales.  Chest:     Chest wall: No tenderness.  Abdominal:     General: Bowel sounds are normal.  There is no distension.     Palpations: Abdomen is soft. There is no mass.     Tenderness: There is no abdominal tenderness. There is no guarding or rebound.  Musculoskeletal: Normal range of motion.        General: No tenderness.  Lymphadenopathy:     Cervical: No cervical adenopathy.  Skin:    General: Skin is warm and dry.     Findings: No rash.  Neurological:     Mental Status: He is alert and oriented to person, place, and time.     Cranial Nerves: No cranial nerve deficit.     Motor: Weakness present. No abnormal muscle tone.     Coordination: Coordination abnormal.     Gait: Gait abnormal.     Deep Tendon Reflexes: Reflexes are normal and symmetric.  Psychiatric:        Behavior: Behavior normal.        Thought Content: Thought content normal.        Judgment: Judgment normal.   walker, weak LEs Muscle waisting - hands  Lab Results  Component Value Date   WBC 5.0 04/14/2017   HGB 11.9 (L) 04/14/2017   HCT 35.7 (L) 04/14/2017   PLT 175 04/14/2017   GLUCOSE 114 (H) 02/05/2019   CHOL 152 09/24/2015   TRIG 151.0 (H) 09/24/2015   HDL 34.10 (L) 09/24/2015   LDLCALC 88 09/24/2015   ALT 23 09/24/2015   AST 19 09/24/2015   NA 141 02/05/2019   K 4.3 02/05/2019   CL 103 02/05/2019   CREATININE 1.57 (H) 02/05/2019   BUN 25 (H) 02/05/2019   CO2 27 02/05/2019   TSH 2.54 01/25/2018   PSA 15.38 (H) 09/29/2012   INR 0.96 04/14/2017   HGBA1C 6.1 02/05/2019    Nm Brain Imaging W/spect (datscan)  Result Date: 04/20/2018 CLINICAL DATA:  Slight tremor of the hands. Difficulty holding objects. Poor gait. Weakness in the legs. EXAM: NUCLEAR MEDICINE BRAIN IMAGING WITH SPECT  (DaTscan ) TECHNIQUE: SPECT images of the brain were obtained after intravenous injection of radiopharmaceutical. 4 hour post injection imaging. Appropriate positioning. 0.8 ml lugols solution administered in a.m RADIOPHARMACEUTICALS:  4.8 millicuries I 809 Ioflupane COMPARISON:  None. FINDINGS: Some difficulty  in positioning patient.  Study is count poor. The LEFT and RIGHT putamen are poorly demonstrated. There is symmetric activity in the caudate heads which is irregular. The normal comma shaped of the striata is lost. IMPRESSION: Decreased radiotracer activity within the LEFT and RIGHT putamen and irregular activity irregular shape of the caudate heads is favored to represent a Parkinson's type syndrome pattern. Electronically Signed   By: Suzy Bouchard M.D.   On: 04/20/2018 15:27  Assessment & Plan:   There are no diagnoses linked to this encounter.   No orders of the defined types were placed in this encounter.    Follow-up: No follow-ups on file.  Walker Kehr, MD

## 2019-06-06 NOTE — Assessment & Plan Note (Signed)
Metformin Labs 

## 2019-06-06 NOTE — Assessment & Plan Note (Signed)
Eliliquis

## 2019-06-06 NOTE — Assessment & Plan Note (Addendum)
Labs On atacand, off Benicar - n/a

## 2019-06-06 NOTE — Assessment & Plan Note (Signed)
Start atacand, off [place]

## 2019-06-18 DIAGNOSIS — H401133 Primary open-angle glaucoma, bilateral, severe stage: Secondary | ICD-10-CM | POA: Diagnosis not present

## 2019-06-18 NOTE — Progress Notes (Signed)
Virtual Visit via Telephone Note The purpose of this virtual visit is to provide medical care while limiting exposure to the novel coronavirus.    Consent was obtained for phone visit:  Yes.   Answered questions that patient had about telehealth interaction:  Yes.   I discussed the limitations, risks, security and privacy concerns of performing an evaluation and management service by telephone. I also discussed with the patient that there may be a patient responsible charge related to this service. The patient expressed understanding and agreed to proceed.  Pt location: Home Physician Location: office Name of referring provider:  Plotnikov, Evie Lacks, MD I connected with .Edward [name] at patients initiation/request on 06/19/2019 at  2:30 PM EDT by telephone and verified that I am speaking with the correct person using two identifiers.  Pt MRN:  [id number] Pt DOB:  October 17, 1933  Participants:  Edward [name]; wife   History of Present Illness:  Patient seen today in follow-up for bradykinesia.  He is on carbidopa/levodopa 25/100, 2 tablets in the morning, 2 in the afternoon and 1 in the evening.  Recommended EMG last visit, but he has not wanted to proceed with that.  Patient did do therapy since our last visit with [facility]  No falls since last visit. Using walker most of the time in the home, because "my knee is bad."   In regards to memory, he states that "my memory is okay."  Wife got on the phone at the end, and stated that "I think he has mild dementia but I can deal with it."   he saw his primary care on June 06, 2019.  Those medical records are reviewed.   Observations/Objective:   Vitals:   06/19/19 1036  Weight: 214 lb (97.1 kg)  Height: 6\' 1"  (1.854 m)     Assessment and Plan:   1.  Bradykinesia             -I agree with Dr. Saintclair Halsted this patient is bradykinetic, but he does not meet criteria for idiopathic Parkinson's disease.  An atypical state is certainly still on the  list of differential and PSP may be on the top of that list.  DaT scan done demonstrated decreased radiotracer activity in the bilateral putamen.  Explained that this is not a diagnostic or therapeutic scan.             -He is now wearing the AFO on the left leg.             -continue carbidopa/levodopa 25/100, 2/2/1             -Discussed EMG testing again.  Wishes to hold on that.               2.  Constipation             -given rancho recipe             -needs to increase water intake.  3.   memory loss             -No longer driving.             -Patient's wife would like [place] to schedule neurocognitive testing.  Patient was agreeable.  Follow Up Instructions:  5 months.  -I discussed the assessment and treatment plan with the patient. The patient was provided an opportunity to ask questions and all were answered. The patient agreed with the plan and demonstrated an understanding of the instructions.  The patient was advised to call back or seek an in-person evaluation if the symptoms worsen or if the condition fails to improve as anticipated.    Total Time spent in visit with the patient was:  10 min, of which 100% of the time was spent in counseling.   Pt understands and agrees with the plan of care outlined.     Alonza Bogus, DO

## 2019-06-19 ENCOUNTER — Other Ambulatory Visit: Payer: Self-pay

## 2019-06-19 ENCOUNTER — Encounter: Payer: Self-pay | Admitting: Neurology

## 2019-06-19 ENCOUNTER — Telehealth (INDEPENDENT_AMBULATORY_CARE_PROVIDER_SITE_OTHER): Payer: Medicare Other | Admitting: Neurology

## 2019-06-19 VITALS — Ht 73.0 in | Wt 214.0 lb

## 2019-06-19 DIAGNOSIS — R413 Other amnesia: Secondary | ICD-10-CM

## 2019-06-19 DIAGNOSIS — G20 Parkinson's disease: Secondary | ICD-10-CM

## 2019-06-26 ENCOUNTER — Telehealth: Payer: Medicare Other | Admitting: Neurology

## 2019-07-27 DIAGNOSIS — H401133 Primary open-angle glaucoma, bilateral, severe stage: Secondary | ICD-10-CM | POA: Diagnosis not present

## 2019-09-06 ENCOUNTER — Ambulatory Visit (INDEPENDENT_AMBULATORY_CARE_PROVIDER_SITE_OTHER): Payer: Medicare Other | Admitting: Internal Medicine

## 2019-09-06 ENCOUNTER — Ambulatory Visit (INDEPENDENT_AMBULATORY_CARE_PROVIDER_SITE_OTHER)
Admission: RE | Admit: 2019-09-06 | Discharge: 2019-09-06 | Disposition: A | Discharge status: Home / Self Care | Payer: Medicare Other | Source: Ambulatory Visit | Attending: Internal Medicine | Admitting: Internal Medicine

## 2019-09-06 ENCOUNTER — Encounter: Payer: Self-pay | Admitting: Internal Medicine

## 2019-09-06 ENCOUNTER — Other Ambulatory Visit: Payer: Self-pay

## 2019-09-06 ENCOUNTER — Other Ambulatory Visit (INDEPENDENT_AMBULATORY_CARE_PROVIDER_SITE_OTHER): Payer: Medicare Other

## 2019-09-06 VITALS — BP 134/82 | HR 79 | Temp 97.5°F | Ht 73.0 in

## 2019-09-06 DIAGNOSIS — C61 Malignant neoplasm of prostate: Secondary | ICD-10-CM

## 2019-09-06 DIAGNOSIS — R202 Paresthesia of skin: Secondary | ICD-10-CM

## 2019-09-06 DIAGNOSIS — N183 Chronic kidney disease, stage 3 unspecified: Secondary | ICD-10-CM

## 2019-09-06 DIAGNOSIS — M545 Low back pain: Secondary | ICD-10-CM | POA: Diagnosis not present

## 2019-09-06 DIAGNOSIS — E559 Vitamin D deficiency, unspecified: Secondary | ICD-10-CM | POA: Diagnosis not present

## 2019-09-06 DIAGNOSIS — I82402 Acute embolism and thrombosis of unspecified deep veins of left lower extremity: Secondary | ICD-10-CM | POA: Diagnosis not present

## 2019-09-06 DIAGNOSIS — M48062 Spinal stenosis, lumbar region with neurogenic claudication: Secondary | ICD-10-CM

## 2019-09-06 DIAGNOSIS — E119 Type 2 diabetes mellitus without complications: Secondary | ICD-10-CM

## 2019-09-06 DIAGNOSIS — M25551 Pain in right hip: Secondary | ICD-10-CM

## 2019-09-06 DIAGNOSIS — S79911A Unspecified injury of right hip, initial encounter: Secondary | ICD-10-CM | POA: Diagnosis not present

## 2019-09-06 DIAGNOSIS — E1122 Type 2 diabetes mellitus with diabetic chronic kidney disease: Secondary | ICD-10-CM | POA: Diagnosis not present

## 2019-09-06 DIAGNOSIS — E785 Hyperlipidemia, unspecified: Secondary | ICD-10-CM | POA: Diagnosis not present

## 2019-09-06 DIAGNOSIS — R269 Unspecified abnormalities of gait and mobility: Secondary | ICD-10-CM

## 2019-09-06 DIAGNOSIS — G20 Parkinson's disease: Secondary | ICD-10-CM

## 2019-09-06 DIAGNOSIS — G8929 Other chronic pain: Secondary | ICD-10-CM

## 2019-09-06 DIAGNOSIS — Z23 Encounter for immunization: Secondary | ICD-10-CM

## 2019-09-06 LAB — CBC WITH DIFFERENTIAL/PLATELET
Basophils Absolute: 0 10*3/uL (ref 0.0–0.1)
Basophils Relative: 0.3 % (ref 0.0–3.0)
Eosinophils Absolute: 0.1 10*3/uL (ref 0.0–0.7)
Eosinophils Relative: 1.4 % (ref 0.0–5.0)
HCT: 42.5 % (ref 39.0–52.0)
Hemoglobin: 14.4 g/dL (ref 13.0–17.0)
Lymphocytes Relative: 19.5 % (ref 12.0–46.0)
Lymphs Abs: 1.3 10*3/uL (ref 0.7–4.0)
MCHC: 33.8 g/dL (ref 30.0–36.0)
MCV: 95.8 fl (ref 78.0–100.0)
Monocytes Absolute: 0.6 10*3/uL (ref 0.1–1.0)
Monocytes Relative: 9.5 % (ref 3.0–12.0)
Neutro Abs: 4.6 10*3/uL (ref 1.4–7.7)
Neutrophils Relative %: 69.3 % (ref 43.0–77.0)
Platelets: 201 10*3/uL (ref 150.0–400.0)
RBC: 4.44 Mil/uL (ref 4.22–5.81)
RDW: 13.2 % (ref 11.5–15.5)
WBC: 6.7 10*3/uL (ref 4.0–10.5)

## 2019-09-06 LAB — BASIC METABOLIC PANEL
BUN: 29 mg/dL — ABNORMAL HIGH (ref 6–23)
CO2: 29 mEq/L (ref 19–32)
Calcium: 9.3 mg/dL (ref 8.4–10.5)
Chloride: 102 mEq/L (ref 96–112)
Creatinine, Ser: 1.6 mg/dL — ABNORMAL HIGH (ref 0.40–1.50)
GFR: 50.04 mL/min — ABNORMAL LOW (ref >60.00)
Glucose, Bld: 100 mg/dL — ABNORMAL HIGH (ref 70–99)
Potassium: 4.7 mEq/L (ref 3.5–5.1)
Sodium: 140 mEq/L (ref 135–145)

## 2019-09-06 LAB — HEPATIC FUNCTION PANEL
ALT: 6 U/L (ref 0–53)
AST: 14 U/L (ref 0–37)
Albumin: 4.4 g/dL (ref 3.5–5.2)
Alkaline Phosphatase: 43 U/L (ref 39–117)
Bilirubin, Direct: 0.1 mg/dL (ref 0.0–0.3)
Total Bilirubin: 0.3 mg/dL (ref 0.2–1.2)
Total Protein: 6.8 g/dL (ref 6.0–8.3)

## 2019-09-06 LAB — VITAMIN D 25 HYDROXY (VIT D DEFICIENCY, FRACTURES): VITD: 91.94 ng/mL (ref 30.00–100.00)

## 2019-09-06 LAB — PSA: PSA: 0.01 ng/mL — ABNORMAL LOW (ref 0.10–4.00)

## 2019-09-06 LAB — VITAMIN B12: Vitamin B-12: >1500 pg/mL — ABNORMAL HIGH (ref 211–911)

## 2019-09-06 LAB — TSH: TSH: 1.14 u[IU]/mL (ref 0.35–4.50)

## 2019-09-06 LAB — HEMOGLOBIN A1C: Hgb A1c MFr Bld: 6.3 % (ref 4.6–6.5)

## 2019-09-06 MED ORDER — APIXABAN 5 MG PO TABS: 5.0000 mg | ORAL_TABLET | Freq: Two times a day (BID) | ORAL | 3 refills | Status: DC

## 2019-09-06 MED ORDER — NIACIN ER (ANTIHYPERLIPIDEMIC) 500 MG PO TBCR: 500.0000 mg | EXTENDED_RELEASE_TABLET | Freq: Every day | ORAL | 3 refills | Status: DC

## 2019-09-06 MED ORDER — FUROSEMIDE 20 MG PO TABS: ORAL_TABLET | ORAL | 3 refills | Status: DC

## 2019-09-06 MED ORDER — GABAPENTIN 300 MG PO CAPS: 300.0000 mg | ORAL_CAPSULE | Freq: Three times a day (TID) | ORAL | 1 refills | Status: DC | PRN

## 2019-09-06 MED ORDER — CANDESARTAN CILEXETIL 32 MG PO TABS: 32.0000 mg | ORAL_TABLET | Freq: Every day | ORAL | 3 refills | Status: DC

## 2019-09-06 MED ORDER — FOLIC ACID 1 MG PO TABS: 1.0000 mg | ORAL_TABLET | Freq: Every day | ORAL | 3 refills | Status: DC

## 2019-09-06 MED ORDER — VITAMIN D (ERGOCALCIFEROL) 1.25 MG (50000 UNIT) PO CAPS: 50000.0000 [IU] | ORAL_CAPSULE | ORAL | 3 refills | Status: DC

## 2019-09-06 MED ORDER — METFORMIN HCL 500 MG PO TABS: 500.0000 mg | ORAL_TABLET | Freq: Two times a day (BID) | ORAL | 3 refills | Status: DC

## 2019-09-06 NOTE — Assessment & Plan Note (Signed)
s/p fall -  ?hematoma X ray

## 2019-09-06 NOTE — Progress Notes (Signed)
Subjective:  Patient ID: Edward [name], male    DOB: [birth year]  Age: 85 y.o. MRN: [id number]  CC: No chief complaint on file.   HPI Edward [name] presents for progressing leg weakness, can't walk C/o grip strength deminishing on the left>R, muscle atrophy. Pt fell 2 wks ago. C/o R hip pain after the fall F/u DVT, CRF   Outpatient Medications Prior to Visit  Medication Sig Dispense Refill  . apixaban (ELIQUIS) 5 MG TABS tablet Take 1 tablet (5 mg total) by mouth 2 (two) times daily. 180 tablet 3  . aspirin EC 81 MG tablet Take 81 mg by mouth daily.    . brimonidine (ALPHAGAN P) 0.1 % SOLN Place 1 drop into both eyes 2 (two) times daily.    . candesartan (ATACAND) 32 MG tablet Take 1 tablet (32 mg total) by mouth daily. 90 tablet 3  . carbidopa-levodopa (SINEMET IR) 25-100 MG tablet 2 in the morning, 2 in the afternoon, 1 in the evening [id number] tablet 1  . folic acid (FOLVITE) 1 MG tablet Take 1 tablet (1 mg total) by mouth daily. 90 tablet 3  . furosemide (LASIX) 20 MG tablet Take 20-40 mgs by mouth 3 times per week as needed for leg swelling 90 tablet 3  . gabapentin (NEURONTIN) 100 MG capsule Take 1 capsule (100 mg total) by mouth at bedtime. 90 capsule 3  . latanoprost (XALATAN) 0.005 % ophthalmic solution Place 1 drop into both eyes at bedtime.    . metFORMIN (GLUCOPHAGE) 500 MG tablet Take 1 tablet (500 mg total) by mouth 2 (two) times daily with a meal. 180 tablet 3  . Multiple Vitamins-Minerals (CENTRUM SILVER) tablet Take 1 tablet by mouth daily. Reported on 05/25/2016    . niacin (NIASPAN) 500 MG CR tablet Take 1 tablet (500 mg total) by mouth daily. 90 tablet 3  . vitamin B-12 (CYANOCOBALAMIN) 1000 MCG tablet Take 1,000 mcg by mouth daily.    . Vitamin D, Ergocalciferol, (DRISDOL) 50000 units CAPS capsule Take 1 capsule (50,000 Units total) by mouth every 30 (thirty) days. First of the month 3 capsule 3   No facility-administered medications prior to visit.     ROS: Review  of Systems  Constitutional: Positive for fatigue. Negative for appetite change and unexpected weight change.  HENT: Negative for congestion, nosebleeds, sneezing, sore throat and trouble swallowing.   Eyes: Negative for itching and visual disturbance.  Respiratory: Negative for cough.   Cardiovascular: Negative for chest pain, palpitations and leg swelling.  Gastrointestinal: Negative for abdominal distention, blood in stool, diarrhea and nausea.  Genitourinary: Negative for frequency and hematuria.  Musculoskeletal: Negative for back pain, gait problem, joint swelling and neck pain.  Skin: Negative for rash.  Neurological: Positive for weakness. Negative for dizziness, tremors and speech difficulty.  Hematological: Bruises/bleeds easily.  Psychiatric/Behavioral: Positive for confusion and decreased concentration. Negative for agitation, dysphoric mood and sleep disturbance. The patient is not nervous/anxious.     Objective:  BP 134/82 (BP Location: Left Arm, Patient Position: Sitting, Cuff Size: Normal)   Pulse 79   Temp (!) 97.5 F (36.4 C) (Oral)   Ht 6\' 1"  (1.854 m)   SpO2 96%   BMI 28.23 kg/m   BP Readings from Last 3 Encounters:  09/06/19 134/82  06/06/19 (!) 146/80  02/05/19 (!) 148/76    Wt Readings from Last 3 Encounters:  06/19/19 214 lb (97.1 kg)  06/06/19 213 lb (96.6 kg)  02/05/19 210 lb (95.3 kg)  Physical Exam Constitutional:      General: He is not in acute distress.    Appearance: He is well-developed.     Comments: NAD  Eyes:     Conjunctiva/sclera: Conjunctivae normal.     Pupils: Pupils are equal, round, and reactive to light.  Neck:     Musculoskeletal: Normal range of motion.     Thyroid: No thyromegaly.     Vascular: No JVD.  Cardiovascular:     Rate and Rhythm: Normal rate and regular rhythm.     Heart sounds: Normal heart sounds. No murmur. No friction rub. No gallop.   Pulmonary:     Effort: Pulmonary effort is normal. No respiratory  distress.     Breath sounds: Normal breath sounds. No wheezing or rales.  Chest:     Chest wall: No tenderness.  Abdominal:     General: Bowel sounds are normal. There is no distension.     Palpations: Abdomen is soft. There is no mass.     Tenderness: There is no abdominal tenderness. There is no guarding or rebound.  Musculoskeletal: Normal range of motion.        General: Tenderness present.  Lymphadenopathy:     Cervical: No cervical adenopathy.  Skin:    General: Skin is warm and dry.     Findings: No rash.  Neurological:     Mental Status: He is alert and oriented to person, place, and time.     Cranial Nerves: No cranial nerve deficit.     Motor: Weakness present. No abnormal muscle tone.     Coordination: Coordination abnormal.     Gait: Gait abnormal.     Deep Tendon Reflexes: Reflexes are normal and symmetric.  Psychiatric:        Behavior: Behavior normal.        Thought Content: Thought content normal.   R hip tender In a w/c  Lab Results  Component Value Date   WBC 5.0 04/14/2017   HGB 11.9 (L) 04/14/2017   HCT 35.7 (L) 04/14/2017   PLT 175 04/14/2017   GLUCOSE 118 (H) 06/06/2019   CHOL 152 09/24/2015   TRIG 151.0 (H) 09/24/2015   HDL 34.10 (L) 09/24/2015   LDLCALC 88 09/24/2015   ALT 23 09/24/2015   AST 19 09/24/2015   NA 140 06/06/2019   K 4.4 06/06/2019   CL 103 06/06/2019   CREATININE 1.47 06/06/2019   BUN 25 (H) 06/06/2019   CO2 28 06/06/2019   TSH 2.54 01/25/2018   PSA 15.38 (H) 09/29/2012   INR 0.96 04/14/2017   HGBA1C 6.3 06/06/2019    Nm Brain Imaging W/spect (datscan)  Result Date: 04/20/2018 CLINICAL DATA:  Slight tremor of the hands. Difficulty holding objects. Poor gait. Weakness in the legs. EXAM: NUCLEAR MEDICINE BRAIN IMAGING WITH SPECT  (DaTscan ) TECHNIQUE: SPECT images of the brain were obtained after intravenous injection of radiopharmaceutical. 4 hour post injection imaging. Appropriate positioning. 0.8 ml lugols solution  administered in a.m RADIOPHARMACEUTICALS:  4.8 millicuries I [id number] Ioflupane COMPARISON:  None. FINDINGS: Some difficulty in positioning patient.  Study is count poor. The LEFT and RIGHT putamen are poorly demonstrated. There is symmetric activity in the caudate heads which is irregular. The normal comma shaped of the striata is lost. IMPRESSION: Decreased radiotracer activity within the LEFT and RIGHT putamen and irregular activity irregular shape of the caudate heads is favored to represent a Parkinson's type syndrome pattern. Electronically Signed   By: Helane Gunther.D.  On: 04/20/2018 15:27    Assessment & Plan:   There are no diagnoses linked to this encounter.   No orders of the defined types were placed in this encounter.    Follow-up: No follow-ups on file.  Walker Kehr, MD

## 2019-09-06 NOTE — Assessment & Plan Note (Signed)
Hydrate well 

## 2019-09-06 NOTE — Assessment & Plan Note (Signed)
Worse Gabapentin increased

## 2019-09-06 NOTE — Assessment & Plan Note (Signed)
In a w/c 

## 2019-09-06 NOTE — Assessment & Plan Note (Signed)
Eliquis 

## 2019-09-06 NOTE — Addendum Note (Signed)
Addended by: Karren Cobble on: 09/06/2019 01:50 PM   Modules accepted: Orders

## 2019-09-06 NOTE — Assessment & Plan Note (Signed)
Pt declined PT.

## 2019-09-17 ENCOUNTER — Telehealth: Payer: Self-pay | Admitting: Neurology

## 2019-09-17 MED ORDER — CARBIDOPA-LEVODOPA 25-100 MG PO TABS: ORAL_TABLET | ORAL | 1 refills | Status: DC

## 2019-09-17 NOTE — Telephone Encounter (Addendum)
Patient's wife called and requested refills in 90 day increments for carbidopa/levodopa 25-100 MG tablets.  Additionally, she wants Dr. Carles Collet to know her husband is declining in her opinion.  [facility] on [address]

## 2019-09-17 NOTE — Telephone Encounter (Signed)
Approved  sent

## 2019-09-17 NOTE — Telephone Encounter (Signed)
Requested Prescriptions   Pending Prescriptions Disp Refills  . carbidopa-levodopa (SINEMET IR) 25-100 MG tablet 450 tablet 1    Sig: 2 in the morning, 2 in the afternoon, 1 in the evening   Rx last filled: 01/26/19 #450 1 refills  Pt last seen: 06/19/19   Follow up appt scheduled: 11/20/19

## 2019-10-18 ENCOUNTER — Ambulatory Visit: Payer: Medicare Other | Admitting: Internal Medicine

## 2019-10-25 ENCOUNTER — Other Ambulatory Visit: Payer: Self-pay

## 2019-10-25 ENCOUNTER — Encounter: Payer: Self-pay | Admitting: Internal Medicine

## 2019-10-25 ENCOUNTER — Ambulatory Visit (INDEPENDENT_AMBULATORY_CARE_PROVIDER_SITE_OTHER): Payer: Medicare Other | Admitting: Internal Medicine

## 2019-10-25 DIAGNOSIS — G20 Parkinson's disease: Secondary | ICD-10-CM | POA: Diagnosis not present

## 2019-10-25 DIAGNOSIS — I872 Venous insufficiency (chronic) (peripheral): Secondary | ICD-10-CM | POA: Diagnosis not present

## 2019-10-25 DIAGNOSIS — G8929 Other chronic pain: Secondary | ICD-10-CM

## 2019-10-25 DIAGNOSIS — M545 Low back pain, unspecified: Secondary | ICD-10-CM

## 2019-10-25 DIAGNOSIS — E119 Type 2 diabetes mellitus without complications: Secondary | ICD-10-CM

## 2019-10-25 NOTE — Progress Notes (Signed)
Subjective:  Patient ID: Edward [name], male    DOB: [birth year]  Age: 85 y.o. MRN: [id number]  CC: No chief complaint on file.   HPI Edward [name] presents for LBP - better w/higher Gabapentin dose F/u gait disorder - fall    Outpatient Medications Prior to Visit  Medication Sig Dispense Refill  . apixaban (ELIQUIS) 5 MG TABS tablet Take 1 tablet (5 mg total) by mouth 2 (two) times daily. 180 tablet 3  . aspirin EC 81 MG tablet Take 81 mg by mouth daily.    . brimonidine (ALPHAGAN P) 0.1 % SOLN Place 1 drop into both eyes 2 (two) times daily.    . candesartan (ATACAND) 32 MG tablet Take 1 tablet (32 mg total) by mouth daily. 90 tablet 3  . carbidopa-levodopa (SINEMET IR) 25-100 MG tablet 2 in the morning, 2 in the afternoon, 1 in the evening [id number] tablet 1  . folic acid (FOLVITE) 1 MG tablet Take 1 tablet (1 mg total) by mouth daily. 90 tablet 3  . furosemide (LASIX) 20 MG tablet Take 20-40 mgs by mouth 3 times per week as needed for leg swelling 90 tablet 3  . gabapentin (NEURONTIN) 300 MG capsule Take 1 capsule (300 mg total) by mouth 3 (three) times daily as needed. 270 capsule 1  . latanoprost (XALATAN) 0.005 % ophthalmic solution Place 1 drop into both eyes at bedtime.    . metFORMIN (GLUCOPHAGE) 500 MG tablet Take 1 tablet (500 mg total) by mouth 2 (two) times daily with a meal. 180 tablet 3  . Multiple Vitamins-Minerals (CENTRUM SILVER) tablet Take 1 tablet by mouth daily. Reported on 05/25/2016    . niacin (NIASPAN) 500 MG CR tablet Take 1 tablet (500 mg total) by mouth daily. 90 tablet 3  . vitamin B-12 (CYANOCOBALAMIN) 1000 MCG tablet Take 1,000 mcg by mouth daily.    . Vitamin D, Ergocalciferol, (DRISDOL) 1.25 MG (50000 UT) CAPS capsule Take 1 capsule (50,000 Units total) by mouth every 30 (thirty) days. First of the month 3 capsule 3   No facility-administered medications prior to visit.     ROS: Review of Systems  Constitutional: Positive for fatigue. Negative for  appetite change and unexpected weight change.  HENT: Negative for congestion, nosebleeds, sneezing, sore throat and trouble swallowing.   Eyes: Negative for itching and visual disturbance.  Respiratory: Negative for cough.   Cardiovascular: Negative for chest pain, palpitations and leg swelling.  Gastrointestinal: Negative for abdominal distention, blood in stool, diarrhea and nausea.  Genitourinary: Negative for frequency and hematuria.  Musculoskeletal: Positive for arthralgias, back pain and gait problem. Negative for joint swelling and neck pain.  Skin: Negative for rash.  Neurological: Negative for dizziness, tremors, speech difficulty and weakness.  Psychiatric/Behavioral: Positive for decreased concentration. Negative for agitation, dysphoric mood, sleep disturbance and suicidal ideas. The patient is not nervous/anxious.     Objective:  BP 136/82 (BP Location: Left Arm, Patient Position: Sitting, Cuff Size: Large)   Pulse 69   Temp 97.6 F (36.4 C) (Oral)   Ht 6\' 1"  (1.854 m)   SpO2 96%   BMI 28.23 kg/m   BP Readings from Last 3 Encounters:  10/25/19 136/82  09/06/19 134/82  06/06/19 (!) 146/80    Wt Readings from Last 3 Encounters:  06/19/19 214 lb (97.1 kg)  06/06/19 213 lb (96.6 kg)  02/05/19 210 lb (95.3 kg)    Physical Exam Constitutional:      General: He is not  in acute distress.    Appearance: He is well-developed.     Comments: NAD  Eyes:     Conjunctiva/sclera: Conjunctivae normal.     Pupils: Pupils are equal, round, and reactive to light.  Neck:     Musculoskeletal: Normal range of motion.     Thyroid: No thyromegaly.     Vascular: No JVD.  Cardiovascular:     Rate and Rhythm: Normal rate and regular rhythm.     Heart sounds: Normal heart sounds. No murmur. No friction rub. No gallop.   Pulmonary:     Effort: Pulmonary effort is normal. No respiratory distress.     Breath sounds: Normal breath sounds. No wheezing or rales.  Chest:     Chest  wall: No tenderness.  Abdominal:     General: Bowel sounds are normal. There is no distension.     Palpations: Abdomen is soft. There is no mass.     Tenderness: There is no abdominal tenderness. There is no guarding or rebound.  Musculoskeletal: Normal range of motion.        General: Swelling present. No tenderness.     Right lower leg: Edema present.     Left lower leg: Edema present.  Lymphadenopathy:     Cervical: No cervical adenopathy.  Skin:    General: Skin is warm and dry.     Findings: No rash.  Neurological:     Mental Status: He is alert and oriented to person, place, and time.     Cranial Nerves: No cranial nerve deficit.     Motor: Weakness present. No abnormal muscle tone.     Coordination: Coordination abnormal.     Gait: Gait abnormal.     Deep Tendon Reflexes: Reflexes are normal and symmetric.  Psychiatric:        Behavior: Behavior normal.        Thought Content: Thought content normal.        Judgment: Judgment normal.    In a w/c Trace edema B, darker ankles Lab Results  Component Value Date   WBC 6.7 09/06/2019   HGB 14.4 09/06/2019   HCT 42.5 09/06/2019   PLT 201.0 09/06/2019   GLUCOSE 100 (H) 09/06/2019   CHOL 152 09/24/2015   TRIG 151.0 (H) 09/24/2015   HDL 34.10 (L) 09/24/2015   LDLCALC 88 09/24/2015   ALT 6 09/06/2019   AST 14 09/06/2019   NA 140 09/06/2019   K 4.7 09/06/2019   CL 102 09/06/2019   CREATININE 1.60 (H) 09/06/2019   BUN 29 (H) 09/06/2019   CO2 29 09/06/2019   TSH 1.14 09/06/2019   PSA 0.01 (L) 09/06/2019   INR 0.96 04/14/2017   HGBA1C 6.3 09/06/2019    Dg Hip Unilat With Pelvis 2-3 Views Right  Result Date: 09/07/2019 CLINICAL DATA:  Fall 2 weeks prior EXAM: DG HIP (WITH OR WITHOUT PELVIS) 2-3V RIGHT COMPARISON:  PET-CT May 16, 2014 FINDINGS: There is no evidence of hip fracture or dislocation. Moderate degenerative changes of the right hip with subchondral sclerosis and spurring of the acetabulum. Enthesopathic  changes are noted upon the right iliac crest and ischial tuberosities. Minimal degenerative changes are present in the right SI joint. Metallic radiodensities at the level of the prostate could reflect either fiducial or brachytherapy seeds, correlate with procedure history. Vascular calcifications are noted in medial right thigh. IMPRESSION: 1. No acute osseous abnormality. 2. Moderate right hip degenerative change. Electronically Signed   By: Elwin Sleight.D.  On: 09/07/2019 03:52    Assessment & Plan:   There are no diagnoses linked to this encounter.   No orders of the defined types were placed in this encounter.    Follow-up: No follow-ups on file.  Walker Kehr, MD

## 2019-10-25 NOTE — Assessment & Plan Note (Signed)
Sinemet w/c

## 2019-10-25 NOTE — Assessment & Plan Note (Signed)
Metformin 

## 2019-10-25 NOTE — Assessment & Plan Note (Signed)
In a w/c Better on higher Gabapentin doese

## 2019-10-25 NOTE — Assessment & Plan Note (Signed)
Better  Eliquis

## 2019-11-20 ENCOUNTER — Ambulatory Visit: Payer: Medicare Other | Admitting: Neurology

## 2020-02-10 ENCOUNTER — Ambulatory Visit: Payer: Medicare Other | Attending: Internal Medicine

## 2020-02-10 DIAGNOSIS — Z23 Encounter for immunization: Secondary | ICD-10-CM | POA: Insufficient documentation

## 2020-02-10 NOTE — Progress Notes (Signed)
   Covid-19 Vaccination Clinic  Name:  Edward [name]    MRN: [id number] DOB: November 19, 1933  02/10/2020  Edward [name] was observed post Covid-19 immunization for 15 minutes without incidence. He was provided with Vaccine Information Sheet and instruction to access the V-Safe system.   Edward [name] was instructed to call 911 with any severe reactions post vaccine: Marland Kitchen Difficulty breathing  . Swelling of your face and throat  . A fast heartbeat  . A bad rash all over your body  . Dizziness and weakness    Immunizations Administered    Name Date Dose VIS Date Route   Pfizer COVID-19 Vaccine 02/10/2020 12:32 PM 0.3 mL 11/30/2019 Intramuscular   Manufacturer: [place]   Lot: [id number]   [place]: [id number]

## 2020-02-28 ENCOUNTER — Telehealth: Payer: Self-pay | Admitting: Neurology

## 2020-02-28 NOTE — Telephone Encounter (Signed)
Okay to RF

## 2020-02-28 NOTE — Telephone Encounter (Signed)
Patient's wife called to set up a vv with Dr. Carles Collet and to get a refill on his Carbidopa Levodopa medication. He uses [facility] on [address]. He is scheduled in June for Follow Up. Thank you

## 2020-02-29 ENCOUNTER — Other Ambulatory Visit: Payer: Self-pay

## 2020-02-29 ENCOUNTER — Other Ambulatory Visit: Payer: Self-pay | Admitting: Internal Medicine

## 2020-02-29 MED ORDER — CARBIDOPA-LEVODOPA 25-100 MG PO TABS: ORAL_TABLET | ORAL | 1 refills | Status: DC

## 2020-02-29 NOTE — Telephone Encounter (Signed)
Carbidopa Refill sent to [facility] on [address]

## 2020-03-04 ENCOUNTER — Encounter: Payer: Self-pay | Admitting: Internal Medicine

## 2020-03-04 ENCOUNTER — Ambulatory Visit (INDEPENDENT_AMBULATORY_CARE_PROVIDER_SITE_OTHER): Payer: Medicare Other | Admitting: Internal Medicine

## 2020-03-04 DIAGNOSIS — G20 Parkinson's disease: Secondary | ICD-10-CM

## 2020-03-04 DIAGNOSIS — R627 Adult failure to thrive: Secondary | ICD-10-CM | POA: Diagnosis not present

## 2020-03-04 DIAGNOSIS — R269 Unspecified abnormalities of gait and mobility: Secondary | ICD-10-CM | POA: Diagnosis not present

## 2020-03-04 NOTE — Progress Notes (Signed)
Virtual Visit via Telephone Note  I connected with Edward [name] on 03/04/20 at 10:00 AM EDT by telephone and verified that I am speaking with the correct person using two identifiers.   I discussed the limitations, risks, security and privacy concerns of performing an evaluation and management service by telephone and the availability of in person appointments. I also discussed with the patient that there may be a patient responsible charge related to this service. The patient expressed understanding and agreed to proceed.   History of Present Illness:   C/o worsening weakness, w/c bound, FTT They need more help Pt would like to have PT/OT Pt/wife declined Hospice consult  Observations/Objective:  Sounds weak on the phone  Assessment and Plan: See Plan  Follow Up Instructions:    I discussed the assessment and treatment plan with the patient. The patient was provided an opportunity to ask questions and all were answered. The patient agreed with the plan and demonstrated an understanding of the instructions.   The patient was advised to call back or seek an in-person evaluation if the symptoms worsen or if the condition fails to improve as anticipated.  I provided 21 minutes of non-face-to-face time during this encounter.   Walker Kehr, MD

## 2020-03-04 NOTE — Assessment & Plan Note (Signed)
Worse They need more help Pt would like to have PT/OT Pt/wife declined Hospice consult

## 2020-03-04 NOTE — Assessment & Plan Note (Signed)
W/c  PT/OT

## 2020-03-04 NOTE — Assessment & Plan Note (Signed)
Due to Parkinson's, spinal stenosis - worse SW consult They need more help Pt would like to have PT/OT Pt/wife declined Hospice consult

## 2020-03-05 ENCOUNTER — Ambulatory Visit: Payer: Medicare Other | Attending: Internal Medicine

## 2020-03-05 DIAGNOSIS — Z23 Encounter for immunization: Secondary | ICD-10-CM

## 2020-03-05 NOTE — Progress Notes (Signed)
   Covid-19 Vaccination Clinic  Name:  CORNELIS EADIE    MRN: [id number] DOB: Aug 20, 1933  03/05/2020  Mr. Aerni was observed post Covid-19 immunization for 15 minutes without incident. He was provided with Vaccine Information Sheet and instruction to access the V-Safe system.   Mr. Blancarte was instructed to call 911 with any severe reactions post vaccine: Marland Kitchen Difficulty breathing  . Swelling of face and throat  . A fast heartbeat  . A bad rash all over body  . Dizziness and weakness   Immunizations Administered    Name Date Dose VIS Date Route   Pfizer COVID-19 Vaccine 03/05/2020  1:01 PM 0.3 mL 11/30/2019 Intramuscular   Manufacturer: [place]   Lot: [id number]   [place]: [id number]

## 2020-03-20 ENCOUNTER — Other Ambulatory Visit: Payer: Self-pay

## 2020-03-20 ENCOUNTER — Ambulatory Visit (INDEPENDENT_AMBULATORY_CARE_PROVIDER_SITE_OTHER): Payer: Medicare Other | Admitting: Internal Medicine

## 2020-03-20 ENCOUNTER — Encounter: Payer: Self-pay | Admitting: Internal Medicine

## 2020-03-20 VITALS — BP 144/80 | HR 69 | Temp 98.0°F

## 2020-03-20 DIAGNOSIS — M48061 Spinal stenosis, lumbar region without neurogenic claudication: Secondary | ICD-10-CM

## 2020-03-20 DIAGNOSIS — R531 Weakness: Secondary | ICD-10-CM | POA: Diagnosis not present

## 2020-03-20 DIAGNOSIS — G20 Parkinson's disease: Secondary | ICD-10-CM | POA: Diagnosis not present

## 2020-03-20 DIAGNOSIS — R269 Unspecified abnormalities of gait and mobility: Secondary | ICD-10-CM | POA: Diagnosis not present

## 2020-03-20 NOTE — Progress Notes (Signed)
Subjective:  Patient ID: Edward [name], male    DOB: 11-Nov-1933  Age: 86 y.o. MRN: [id number]  CC: No chief complaint on file.   HPI Edward [name] presents for Parkinson's, weakness - worse Needs Home PT/OT/RN ref  Outpatient Medications Prior to Visit  Medication Sig Dispense Refill  . apixaban (ELIQUIS) 5 MG TABS tablet Take 1 tablet (5 mg total) by mouth 2 (two) times daily. 180 tablet 3  . aspirin EC 81 MG tablet Take 81 mg by mouth daily.    . brimonidine (ALPHAGAN P) 0.1 % SOLN Place 1 drop into both eyes 2 (two) times daily.    . candesartan (ATACAND) 32 MG tablet Take 1 tablet (32 mg total) by mouth daily. 90 tablet 3  . carbidopa-levodopa (SINEMET IR) 25-100 MG tablet 2 in the morning, 2 in the afternoon, 1 in the evening [id number] tablet 1  . folic acid (FOLVITE) 1 MG tablet Take 1 tablet (1 mg total) by mouth daily. 90 tablet 3  . furosemide (LASIX) 20 MG tablet Take 20-40 mgs by mouth 3 times per week as needed for leg swelling 90 tablet 3  . gabapentin (NEURONTIN) 300 MG capsule Take 1 capsule by mouth three times daily as needed 270 capsule 1  . latanoprost (XALATAN) 0.005 % ophthalmic solution Place 1 drop into both eyes at bedtime.    . metFORMIN (GLUCOPHAGE) 500 MG tablet Take 1 tablet (500 mg total) by mouth 2 (two) times daily with a meal. 180 tablet 3  . Multiple Vitamins-Minerals (CENTRUM SILVER) tablet Take 1 tablet by mouth daily. Reported on 05/25/2016    . niacin (NIASPAN) 500 MG CR tablet Take 1 tablet (500 mg total) by mouth daily. 90 tablet 3  . vitamin B-12 (CYANOCOBALAMIN) 1000 MCG tablet Take 1,000 mcg by mouth daily.    . Vitamin D, Ergocalciferol, (DRISDOL) 1.25 MG (50000 UT) CAPS capsule Take 1 capsule (50,000 Units total) by mouth every 30 (thirty) days. First of the month 3 capsule 3   No facility-administered medications prior to visit.    ROS: Review of Systems  Constitutional: Positive for fatigue. Negative for appetite change and unexpected  weight change.  HENT: Negative for congestion, nosebleeds, sneezing, sore throat and trouble swallowing.   Eyes: Negative for itching and visual disturbance.  Respiratory: Negative for cough.   Cardiovascular: Negative for chest pain, palpitations and leg swelling.  Gastrointestinal: Negative for abdominal distention, blood in stool, diarrhea and nausea.  Genitourinary: Negative for frequency and hematuria.  Musculoskeletal: Positive for arthralgias, back pain and gait problem. Negative for joint swelling and neck pain.  Skin: Negative for rash.  Neurological: Positive for weakness. Negative for dizziness, tremors and speech difficulty.  Psychiatric/Behavioral: Negative for agitation, dysphoric mood and sleep disturbance. The patient is not nervous/anxious.     Objective:  BP (!) 144/80 (BP Location: Left Arm, Patient Position: Sitting, Cuff Size: Large)   Pulse 69   Temp 98 F (36.7 C) (Oral)   SpO2 94%   BP Readings from Last 3 Encounters:  03/20/20 (!) 144/80  10/25/19 136/82  09/06/19 134/82    Wt Readings from Last 3 Encounters:  06/19/19 214 lb (97.1 kg)  06/06/19 213 lb (96.6 kg)  02/05/19 210 lb (95.3 kg)    Physical Exam Constitutional:      General: He is not in acute distress.    Appearance: He is well-developed.     Comments: NAD  Eyes:     Conjunctiva/sclera: Conjunctivae normal.  Pupils: Pupils are equal, round, and reactive to light.  Neck:     Thyroid: No thyromegaly.     Vascular: No JVD.  Cardiovascular:     Rate and Rhythm: Normal rate and regular rhythm.     Heart sounds: Normal heart sounds. No murmur. No friction rub. No gallop.   Pulmonary:     Effort: Pulmonary effort is normal. No respiratory distress.     Breath sounds: Normal breath sounds. No wheezing or rales.  Chest:     Chest wall: No tenderness.  Abdominal:     General: Bowel sounds are normal. There is no distension.     Palpations: Abdomen is soft. There is no mass.      Tenderness: There is no abdominal tenderness. There is no guarding or rebound.  Musculoskeletal:        General: Swelling and tenderness present. Normal range of motion.     Cervical back: Normal range of motion.  Lymphadenopathy:     Cervical: No cervical adenopathy.  Skin:    General: Skin is warm and dry.     Findings: No rash.  Neurological:     Mental Status: He is alert and oriented to person, place, and time.     Cranial Nerves: No cranial nerve deficit.     Motor: Weakness present. No abnormal muscle tone.     Coordination: Coordination abnormal.     Gait: Gait abnormal.     Deep Tendon Reflexes: Reflexes are normal and symmetric.  Psychiatric:        Behavior: Behavior normal.        Thought Content: Thought content normal.        Judgment: Judgment normal.   in a w/c Very weak legs (spinal stenosis) - he would benefit from a light wt w/c Patient has mobility limitations which cannot be resolved with a cane, crutch or walker. Patient can safely self propel the lightweight wheelchair but cannot safely self propel the heavier standard weight wheelchair.   B LE edema trace  Lab Results  Component Value Date   WBC 6.7 09/06/2019   HGB 14.4 09/06/2019   HCT 42.5 09/06/2019   PLT 201.0 09/06/2019   GLUCOSE 100 (H) 09/06/2019   CHOL 152 09/24/2015   TRIG 151.0 (H) 09/24/2015   HDL 34.10 (L) 09/24/2015   LDLCALC 88 09/24/2015   ALT 6 09/06/2019   AST 14 09/06/2019   NA 140 09/06/2019   K 4.7 09/06/2019   CL 102 09/06/2019   CREATININE 1.60 (H) 09/06/2019   BUN 29 (H) 09/06/2019   CO2 29 09/06/2019   TSH 1.14 09/06/2019   PSA 0.01 (L) 09/06/2019   INR 0.96 04/14/2017   HGBA1C 6.3 09/06/2019    DG HIP UNILAT WITH PELVIS 2-3 VIEWS RIGHT  Result Date: 09/07/2019 CLINICAL DATA:  Fall 2 weeks prior EXAM: DG HIP (WITH OR WITHOUT PELVIS) 2-3V RIGHT COMPARISON:  PET-CT May 16, 2014 FINDINGS: There is no evidence of hip fracture or dislocation. Moderate degenerative  changes of the right hip with subchondral sclerosis and spurring of the acetabulum. Enthesopathic changes are noted upon the right iliac crest and ischial tuberosities. Minimal degenerative changes are present in the right SI joint. Metallic radiodensities at the level of the prostate could reflect either fiducial or brachytherapy seeds, correlate with procedure history. Vascular calcifications are noted in medial right thigh. IMPRESSION: 1. No acute osseous abnormality. 2. Moderate right hip degenerative change. Electronically Signed   By: Elwin Sleight.D.  On: 09/07/2019 03:52    Assessment & Plan:   There are no diagnoses linked to this encounter.   No orders of the defined types were placed in this encounter.    Follow-up: No follow-ups on file.  Walker Kehr, MD

## 2020-03-25 ENCOUNTER — Telehealth: Payer: Self-pay

## 2020-03-25 NOTE — Telephone Encounter (Signed)
Edward [name] with [facility] calling and states that Dr Alain Marion wanted a home health assessment. States that there was not a need to nursing. Would like to know if she could get orders for Physical Therapy to do an assessment for safe transfers, exercise, and gait. Please advise.  CB#:[phone]

## 2020-03-27 NOTE — Telephone Encounter (Signed)
Notified Sherry w/MD response.Marland KitchenJohny Chess

## 2020-03-27 NOTE — Telephone Encounter (Signed)
Ok Thx 

## 2020-03-28 DIAGNOSIS — M48061 Spinal stenosis, lumbar region without neurogenic claudication: Secondary | ICD-10-CM | POA: Diagnosis not present

## 2020-03-28 DIAGNOSIS — I129 Hypertensive chronic kidney disease with stage 1 through stage 4 chronic kidney disease, or unspecified chronic kidney disease: Secondary | ICD-10-CM | POA: Diagnosis not present

## 2020-03-28 DIAGNOSIS — R627 Adult failure to thrive: Secondary | ICD-10-CM | POA: Diagnosis not present

## 2020-03-28 DIAGNOSIS — Z9181 History of falling: Secondary | ICD-10-CM | POA: Diagnosis not present

## 2020-03-28 DIAGNOSIS — E785 Hyperlipidemia, unspecified: Secondary | ICD-10-CM | POA: Diagnosis not present

## 2020-03-28 DIAGNOSIS — Z7982 Long term (current) use of aspirin: Secondary | ICD-10-CM | POA: Diagnosis not present

## 2020-03-28 DIAGNOSIS — Z7984 Long term (current) use of oral hypoglycemic drugs: Secondary | ICD-10-CM | POA: Diagnosis not present

## 2020-03-28 DIAGNOSIS — N183 Chronic kidney disease, stage 3 unspecified: Secondary | ICD-10-CM | POA: Diagnosis not present

## 2020-03-28 DIAGNOSIS — E1122 Type 2 diabetes mellitus with diabetic chronic kidney disease: Secondary | ICD-10-CM | POA: Diagnosis not present

## 2020-03-28 DIAGNOSIS — C61 Malignant neoplasm of prostate: Secondary | ICD-10-CM | POA: Diagnosis not present

## 2020-03-28 DIAGNOSIS — I872 Venous insufficiency (chronic) (peripheral): Secondary | ICD-10-CM | POA: Diagnosis not present

## 2020-03-28 DIAGNOSIS — Z7901 Long term (current) use of anticoagulants: Secondary | ICD-10-CM | POA: Diagnosis not present

## 2020-03-28 DIAGNOSIS — G20 Parkinson's disease: Secondary | ICD-10-CM | POA: Diagnosis not present

## 2020-03-28 DIAGNOSIS — E1151 Type 2 diabetes mellitus with diabetic peripheral angiopathy without gangrene: Secondary | ICD-10-CM | POA: Diagnosis not present

## 2020-03-28 DIAGNOSIS — I82412 Acute embolism and thrombosis of left femoral vein: Secondary | ICD-10-CM | POA: Diagnosis not present

## 2020-03-31 ENCOUNTER — Telehealth: Payer: Self-pay | Admitting: Internal Medicine

## 2020-03-31 DIAGNOSIS — M48061 Spinal stenosis, lumbar region without neurogenic claudication: Secondary | ICD-10-CM | POA: Diagnosis not present

## 2020-03-31 DIAGNOSIS — G20 Parkinson's disease: Secondary | ICD-10-CM | POA: Diagnosis not present

## 2020-03-31 DIAGNOSIS — E1151 Type 2 diabetes mellitus with diabetic peripheral angiopathy without gangrene: Secondary | ICD-10-CM | POA: Diagnosis not present

## 2020-03-31 DIAGNOSIS — I129 Hypertensive chronic kidney disease with stage 1 through stage 4 chronic kidney disease, or unspecified chronic kidney disease: Secondary | ICD-10-CM | POA: Diagnosis not present

## 2020-03-31 DIAGNOSIS — N183 Chronic kidney disease, stage 3 unspecified: Secondary | ICD-10-CM | POA: Diagnosis not present

## 2020-03-31 DIAGNOSIS — E1122 Type 2 diabetes mellitus with diabetic chronic kidney disease: Secondary | ICD-10-CM | POA: Diagnosis not present

## 2020-03-31 NOTE — Telephone Encounter (Signed)
    Edward [name] from [facility] requesting order for home PT  1x week for 1 2x week for 6 1x week for  2  Phone [phone]

## 2020-03-31 NOTE — Telephone Encounter (Signed)
FYI, verbals given 

## 2020-04-02 DIAGNOSIS — N183 Chronic kidney disease, stage 3 unspecified: Secondary | ICD-10-CM | POA: Diagnosis not present

## 2020-04-02 DIAGNOSIS — E1122 Type 2 diabetes mellitus with diabetic chronic kidney disease: Secondary | ICD-10-CM | POA: Diagnosis not present

## 2020-04-02 DIAGNOSIS — E1151 Type 2 diabetes mellitus with diabetic peripheral angiopathy without gangrene: Secondary | ICD-10-CM | POA: Diagnosis not present

## 2020-04-02 DIAGNOSIS — I129 Hypertensive chronic kidney disease with stage 1 through stage 4 chronic kidney disease, or unspecified chronic kidney disease: Secondary | ICD-10-CM | POA: Diagnosis not present

## 2020-04-02 DIAGNOSIS — G20 Parkinson's disease: Secondary | ICD-10-CM | POA: Diagnosis not present

## 2020-04-02 DIAGNOSIS — M48061 Spinal stenosis, lumbar region without neurogenic claudication: Secondary | ICD-10-CM | POA: Diagnosis not present

## 2020-04-04 ENCOUNTER — Telehealth: Payer: Self-pay | Admitting: Internal Medicine

## 2020-04-04 DIAGNOSIS — M48061 Spinal stenosis, lumbar region without neurogenic claudication: Secondary | ICD-10-CM | POA: Diagnosis not present

## 2020-04-04 DIAGNOSIS — E1151 Type 2 diabetes mellitus with diabetic peripheral angiopathy without gangrene: Secondary | ICD-10-CM | POA: Diagnosis not present

## 2020-04-04 DIAGNOSIS — E1122 Type 2 diabetes mellitus with diabetic chronic kidney disease: Secondary | ICD-10-CM | POA: Diagnosis not present

## 2020-04-04 DIAGNOSIS — G20 Parkinson's disease: Secondary | ICD-10-CM | POA: Diagnosis not present

## 2020-04-04 DIAGNOSIS — I129 Hypertensive chronic kidney disease with stage 1 through stage 4 chronic kidney disease, or unspecified chronic kidney disease: Secondary | ICD-10-CM | POA: Diagnosis not present

## 2020-04-04 DIAGNOSIS — N183 Chronic kidney disease, stage 3 unspecified: Secondary | ICD-10-CM | POA: Diagnosis not present

## 2020-04-04 NOTE — Telephone Encounter (Signed)
New Message:   Edward [name] is calling from [facility] to receive verbal orders for OT for 1 week 1, 2 week 5, and 1 week 2. Please advise.

## 2020-04-07 DIAGNOSIS — Z7982 Long term (current) use of aspirin: Secondary | ICD-10-CM

## 2020-04-07 DIAGNOSIS — M48061 Spinal stenosis, lumbar region without neurogenic claudication: Secondary | ICD-10-CM | POA: Diagnosis not present

## 2020-04-07 DIAGNOSIS — Z7984 Long term (current) use of oral hypoglycemic drugs: Secondary | ICD-10-CM

## 2020-04-07 DIAGNOSIS — I82412 Acute embolism and thrombosis of left femoral vein: Secondary | ICD-10-CM

## 2020-04-07 DIAGNOSIS — C61 Malignant neoplasm of prostate: Secondary | ICD-10-CM

## 2020-04-07 DIAGNOSIS — I129 Hypertensive chronic kidney disease with stage 1 through stage 4 chronic kidney disease, or unspecified chronic kidney disease: Secondary | ICD-10-CM | POA: Diagnosis not present

## 2020-04-07 DIAGNOSIS — I872 Venous insufficiency (chronic) (peripheral): Secondary | ICD-10-CM

## 2020-04-07 DIAGNOSIS — N183 Chronic kidney disease, stage 3 unspecified: Secondary | ICD-10-CM

## 2020-04-07 DIAGNOSIS — Z7901 Long term (current) use of anticoagulants: Secondary | ICD-10-CM

## 2020-04-07 DIAGNOSIS — R627 Adult failure to thrive: Secondary | ICD-10-CM

## 2020-04-07 DIAGNOSIS — E1151 Type 2 diabetes mellitus with diabetic peripheral angiopathy without gangrene: Secondary | ICD-10-CM

## 2020-04-07 DIAGNOSIS — E785 Hyperlipidemia, unspecified: Secondary | ICD-10-CM

## 2020-04-07 DIAGNOSIS — E1122 Type 2 diabetes mellitus with diabetic chronic kidney disease: Secondary | ICD-10-CM | POA: Diagnosis not present

## 2020-04-07 DIAGNOSIS — G20 Parkinson's disease: Secondary | ICD-10-CM | POA: Diagnosis not present

## 2020-04-07 DIAGNOSIS — Z9181 History of falling: Secondary | ICD-10-CM

## 2020-04-07 NOTE — Telephone Encounter (Signed)
FYI, verbal orders given

## 2020-04-08 DIAGNOSIS — N183 Chronic kidney disease, stage 3 unspecified: Secondary | ICD-10-CM | POA: Diagnosis not present

## 2020-04-08 DIAGNOSIS — M48061 Spinal stenosis, lumbar region without neurogenic claudication: Secondary | ICD-10-CM | POA: Diagnosis not present

## 2020-04-08 DIAGNOSIS — E1151 Type 2 diabetes mellitus with diabetic peripheral angiopathy without gangrene: Secondary | ICD-10-CM | POA: Diagnosis not present

## 2020-04-08 DIAGNOSIS — G20 Parkinson's disease: Secondary | ICD-10-CM | POA: Diagnosis not present

## 2020-04-08 DIAGNOSIS — E1122 Type 2 diabetes mellitus with diabetic chronic kidney disease: Secondary | ICD-10-CM | POA: Diagnosis not present

## 2020-04-08 DIAGNOSIS — I129 Hypertensive chronic kidney disease with stage 1 through stage 4 chronic kidney disease, or unspecified chronic kidney disease: Secondary | ICD-10-CM | POA: Diagnosis not present

## 2020-04-10 DIAGNOSIS — M48061 Spinal stenosis, lumbar region without neurogenic claudication: Secondary | ICD-10-CM | POA: Diagnosis not present

## 2020-04-10 DIAGNOSIS — N183 Chronic kidney disease, stage 3 unspecified: Secondary | ICD-10-CM | POA: Diagnosis not present

## 2020-04-10 DIAGNOSIS — G20 Parkinson's disease: Secondary | ICD-10-CM | POA: Diagnosis not present

## 2020-04-10 DIAGNOSIS — E1151 Type 2 diabetes mellitus with diabetic peripheral angiopathy without gangrene: Secondary | ICD-10-CM | POA: Diagnosis not present

## 2020-04-10 DIAGNOSIS — E1122 Type 2 diabetes mellitus with diabetic chronic kidney disease: Secondary | ICD-10-CM | POA: Diagnosis not present

## 2020-04-10 DIAGNOSIS — I129 Hypertensive chronic kidney disease with stage 1 through stage 4 chronic kidney disease, or unspecified chronic kidney disease: Secondary | ICD-10-CM | POA: Diagnosis not present

## 2020-04-11 DIAGNOSIS — N183 Chronic kidney disease, stage 3 unspecified: Secondary | ICD-10-CM | POA: Diagnosis not present

## 2020-04-11 DIAGNOSIS — E1151 Type 2 diabetes mellitus with diabetic peripheral angiopathy without gangrene: Secondary | ICD-10-CM | POA: Diagnosis not present

## 2020-04-11 DIAGNOSIS — G20 Parkinson's disease: Secondary | ICD-10-CM | POA: Diagnosis not present

## 2020-04-11 DIAGNOSIS — M48061 Spinal stenosis, lumbar region without neurogenic claudication: Secondary | ICD-10-CM | POA: Diagnosis not present

## 2020-04-11 DIAGNOSIS — E1122 Type 2 diabetes mellitus with diabetic chronic kidney disease: Secondary | ICD-10-CM | POA: Diagnosis not present

## 2020-04-11 DIAGNOSIS — I129 Hypertensive chronic kidney disease with stage 1 through stage 4 chronic kidney disease, or unspecified chronic kidney disease: Secondary | ICD-10-CM | POA: Diagnosis not present

## 2020-04-14 DIAGNOSIS — N183 Chronic kidney disease, stage 3 unspecified: Secondary | ICD-10-CM | POA: Diagnosis not present

## 2020-04-14 DIAGNOSIS — E1122 Type 2 diabetes mellitus with diabetic chronic kidney disease: Secondary | ICD-10-CM | POA: Diagnosis not present

## 2020-04-14 DIAGNOSIS — M48061 Spinal stenosis, lumbar region without neurogenic claudication: Secondary | ICD-10-CM | POA: Diagnosis not present

## 2020-04-14 DIAGNOSIS — I129 Hypertensive chronic kidney disease with stage 1 through stage 4 chronic kidney disease, or unspecified chronic kidney disease: Secondary | ICD-10-CM | POA: Diagnosis not present

## 2020-04-14 DIAGNOSIS — G20 Parkinson's disease: Secondary | ICD-10-CM | POA: Diagnosis not present

## 2020-04-14 DIAGNOSIS — E1151 Type 2 diabetes mellitus with diabetic peripheral angiopathy without gangrene: Secondary | ICD-10-CM | POA: Diagnosis not present

## 2020-04-16 DIAGNOSIS — E1151 Type 2 diabetes mellitus with diabetic peripheral angiopathy without gangrene: Secondary | ICD-10-CM | POA: Diagnosis not present

## 2020-04-16 DIAGNOSIS — E1122 Type 2 diabetes mellitus with diabetic chronic kidney disease: Secondary | ICD-10-CM | POA: Diagnosis not present

## 2020-04-16 DIAGNOSIS — N183 Chronic kidney disease, stage 3 unspecified: Secondary | ICD-10-CM | POA: Diagnosis not present

## 2020-04-16 DIAGNOSIS — G20 Parkinson's disease: Secondary | ICD-10-CM | POA: Diagnosis not present

## 2020-04-16 DIAGNOSIS — M48061 Spinal stenosis, lumbar region without neurogenic claudication: Secondary | ICD-10-CM | POA: Diagnosis not present

## 2020-04-16 DIAGNOSIS — I129 Hypertensive chronic kidney disease with stage 1 through stage 4 chronic kidney disease, or unspecified chronic kidney disease: Secondary | ICD-10-CM | POA: Diagnosis not present

## 2020-04-18 DIAGNOSIS — I129 Hypertensive chronic kidney disease with stage 1 through stage 4 chronic kidney disease, or unspecified chronic kidney disease: Secondary | ICD-10-CM | POA: Diagnosis not present

## 2020-04-18 DIAGNOSIS — N183 Chronic kidney disease, stage 3 unspecified: Secondary | ICD-10-CM | POA: Diagnosis not present

## 2020-04-18 DIAGNOSIS — E1122 Type 2 diabetes mellitus with diabetic chronic kidney disease: Secondary | ICD-10-CM | POA: Diagnosis not present

## 2020-04-18 DIAGNOSIS — M48061 Spinal stenosis, lumbar region without neurogenic claudication: Secondary | ICD-10-CM | POA: Diagnosis not present

## 2020-04-18 DIAGNOSIS — E1151 Type 2 diabetes mellitus with diabetic peripheral angiopathy without gangrene: Secondary | ICD-10-CM | POA: Diagnosis not present

## 2020-04-18 DIAGNOSIS — G20 Parkinson's disease: Secondary | ICD-10-CM | POA: Diagnosis not present

## 2020-04-21 DIAGNOSIS — M48061 Spinal stenosis, lumbar region without neurogenic claudication: Secondary | ICD-10-CM | POA: Diagnosis not present

## 2020-04-21 DIAGNOSIS — E1151 Type 2 diabetes mellitus with diabetic peripheral angiopathy without gangrene: Secondary | ICD-10-CM | POA: Diagnosis not present

## 2020-04-21 DIAGNOSIS — G20 Parkinson's disease: Secondary | ICD-10-CM | POA: Diagnosis not present

## 2020-04-21 DIAGNOSIS — N183 Chronic kidney disease, stage 3 unspecified: Secondary | ICD-10-CM | POA: Diagnosis not present

## 2020-04-21 DIAGNOSIS — I129 Hypertensive chronic kidney disease with stage 1 through stage 4 chronic kidney disease, or unspecified chronic kidney disease: Secondary | ICD-10-CM | POA: Diagnosis not present

## 2020-04-21 DIAGNOSIS — E1122 Type 2 diabetes mellitus with diabetic chronic kidney disease: Secondary | ICD-10-CM | POA: Diagnosis not present

## 2020-04-22 DIAGNOSIS — I129 Hypertensive chronic kidney disease with stage 1 through stage 4 chronic kidney disease, or unspecified chronic kidney disease: Secondary | ICD-10-CM | POA: Diagnosis not present

## 2020-04-22 DIAGNOSIS — N183 Chronic kidney disease, stage 3 unspecified: Secondary | ICD-10-CM | POA: Diagnosis not present

## 2020-04-22 DIAGNOSIS — G20 Parkinson's disease: Secondary | ICD-10-CM | POA: Diagnosis not present

## 2020-04-22 DIAGNOSIS — E1151 Type 2 diabetes mellitus with diabetic peripheral angiopathy without gangrene: Secondary | ICD-10-CM | POA: Diagnosis not present

## 2020-04-22 DIAGNOSIS — M48061 Spinal stenosis, lumbar region without neurogenic claudication: Secondary | ICD-10-CM | POA: Diagnosis not present

## 2020-04-22 DIAGNOSIS — E1122 Type 2 diabetes mellitus with diabetic chronic kidney disease: Secondary | ICD-10-CM | POA: Diagnosis not present

## 2020-04-23 DIAGNOSIS — E1122 Type 2 diabetes mellitus with diabetic chronic kidney disease: Secondary | ICD-10-CM | POA: Diagnosis not present

## 2020-04-23 DIAGNOSIS — I129 Hypertensive chronic kidney disease with stage 1 through stage 4 chronic kidney disease, or unspecified chronic kidney disease: Secondary | ICD-10-CM | POA: Diagnosis not present

## 2020-04-23 DIAGNOSIS — M48061 Spinal stenosis, lumbar region without neurogenic claudication: Secondary | ICD-10-CM | POA: Diagnosis not present

## 2020-04-23 DIAGNOSIS — E1151 Type 2 diabetes mellitus with diabetic peripheral angiopathy without gangrene: Secondary | ICD-10-CM | POA: Diagnosis not present

## 2020-04-23 DIAGNOSIS — G20 Parkinson's disease: Secondary | ICD-10-CM | POA: Diagnosis not present

## 2020-04-23 DIAGNOSIS — N183 Chronic kidney disease, stage 3 unspecified: Secondary | ICD-10-CM | POA: Diagnosis not present

## 2020-04-25 DIAGNOSIS — I129 Hypertensive chronic kidney disease with stage 1 through stage 4 chronic kidney disease, or unspecified chronic kidney disease: Secondary | ICD-10-CM | POA: Diagnosis not present

## 2020-04-25 DIAGNOSIS — E1122 Type 2 diabetes mellitus with diabetic chronic kidney disease: Secondary | ICD-10-CM | POA: Diagnosis not present

## 2020-04-25 DIAGNOSIS — N183 Chronic kidney disease, stage 3 unspecified: Secondary | ICD-10-CM | POA: Diagnosis not present

## 2020-04-25 DIAGNOSIS — E1151 Type 2 diabetes mellitus with diabetic peripheral angiopathy without gangrene: Secondary | ICD-10-CM | POA: Diagnosis not present

## 2020-04-25 DIAGNOSIS — G20 Parkinson's disease: Secondary | ICD-10-CM | POA: Diagnosis not present

## 2020-04-25 DIAGNOSIS — M48061 Spinal stenosis, lumbar region without neurogenic claudication: Secondary | ICD-10-CM | POA: Diagnosis not present

## 2020-04-27 DIAGNOSIS — E785 Hyperlipidemia, unspecified: Secondary | ICD-10-CM | POA: Diagnosis not present

## 2020-04-27 DIAGNOSIS — Z7901 Long term (current) use of anticoagulants: Secondary | ICD-10-CM | POA: Diagnosis not present

## 2020-04-27 DIAGNOSIS — Z7982 Long term (current) use of aspirin: Secondary | ICD-10-CM | POA: Diagnosis not present

## 2020-04-27 DIAGNOSIS — I82412 Acute embolism and thrombosis of left femoral vein: Secondary | ICD-10-CM | POA: Diagnosis not present

## 2020-04-27 DIAGNOSIS — I872 Venous insufficiency (chronic) (peripheral): Secondary | ICD-10-CM | POA: Diagnosis not present

## 2020-04-27 DIAGNOSIS — Z9181 History of falling: Secondary | ICD-10-CM | POA: Diagnosis not present

## 2020-04-27 DIAGNOSIS — E1122 Type 2 diabetes mellitus with diabetic chronic kidney disease: Secondary | ICD-10-CM | POA: Diagnosis not present

## 2020-04-27 DIAGNOSIS — R627 Adult failure to thrive: Secondary | ICD-10-CM | POA: Diagnosis not present

## 2020-04-27 DIAGNOSIS — Z7984 Long term (current) use of oral hypoglycemic drugs: Secondary | ICD-10-CM | POA: Diagnosis not present

## 2020-04-27 DIAGNOSIS — M48061 Spinal stenosis, lumbar region without neurogenic claudication: Secondary | ICD-10-CM | POA: Diagnosis not present

## 2020-04-27 DIAGNOSIS — G20 Parkinson's disease: Secondary | ICD-10-CM | POA: Diagnosis not present

## 2020-04-27 DIAGNOSIS — E1151 Type 2 diabetes mellitus with diabetic peripheral angiopathy without gangrene: Secondary | ICD-10-CM | POA: Diagnosis not present

## 2020-04-27 DIAGNOSIS — N183 Chronic kidney disease, stage 3 unspecified: Secondary | ICD-10-CM | POA: Diagnosis not present

## 2020-04-27 DIAGNOSIS — C61 Malignant neoplasm of prostate: Secondary | ICD-10-CM | POA: Diagnosis not present

## 2020-04-27 DIAGNOSIS — I129 Hypertensive chronic kidney disease with stage 1 through stage 4 chronic kidney disease, or unspecified chronic kidney disease: Secondary | ICD-10-CM | POA: Diagnosis not present

## 2020-04-28 DIAGNOSIS — N183 Chronic kidney disease, stage 3 unspecified: Secondary | ICD-10-CM | POA: Diagnosis not present

## 2020-04-28 DIAGNOSIS — I129 Hypertensive chronic kidney disease with stage 1 through stage 4 chronic kidney disease, or unspecified chronic kidney disease: Secondary | ICD-10-CM | POA: Diagnosis not present

## 2020-04-28 DIAGNOSIS — G20 Parkinson's disease: Secondary | ICD-10-CM | POA: Diagnosis not present

## 2020-04-28 DIAGNOSIS — E1122 Type 2 diabetes mellitus with diabetic chronic kidney disease: Secondary | ICD-10-CM | POA: Diagnosis not present

## 2020-04-28 DIAGNOSIS — M48061 Spinal stenosis, lumbar region without neurogenic claudication: Secondary | ICD-10-CM | POA: Diagnosis not present

## 2020-04-28 DIAGNOSIS — E1151 Type 2 diabetes mellitus with diabetic peripheral angiopathy without gangrene: Secondary | ICD-10-CM | POA: Diagnosis not present

## 2020-04-29 DIAGNOSIS — E1122 Type 2 diabetes mellitus with diabetic chronic kidney disease: Secondary | ICD-10-CM | POA: Diagnosis not present

## 2020-04-29 DIAGNOSIS — I129 Hypertensive chronic kidney disease with stage 1 through stage 4 chronic kidney disease, or unspecified chronic kidney disease: Secondary | ICD-10-CM | POA: Diagnosis not present

## 2020-04-29 DIAGNOSIS — M48061 Spinal stenosis, lumbar region without neurogenic claudication: Secondary | ICD-10-CM | POA: Diagnosis not present

## 2020-04-29 DIAGNOSIS — G20 Parkinson's disease: Secondary | ICD-10-CM | POA: Diagnosis not present

## 2020-04-29 DIAGNOSIS — N183 Chronic kidney disease, stage 3 unspecified: Secondary | ICD-10-CM | POA: Diagnosis not present

## 2020-04-29 DIAGNOSIS — E1151 Type 2 diabetes mellitus with diabetic peripheral angiopathy without gangrene: Secondary | ICD-10-CM | POA: Diagnosis not present

## 2020-04-30 DIAGNOSIS — G20 Parkinson's disease: Secondary | ICD-10-CM | POA: Diagnosis not present

## 2020-04-30 DIAGNOSIS — M48061 Spinal stenosis, lumbar region without neurogenic claudication: Secondary | ICD-10-CM | POA: Diagnosis not present

## 2020-04-30 DIAGNOSIS — I129 Hypertensive chronic kidney disease with stage 1 through stage 4 chronic kidney disease, or unspecified chronic kidney disease: Secondary | ICD-10-CM | POA: Diagnosis not present

## 2020-04-30 DIAGNOSIS — E1151 Type 2 diabetes mellitus with diabetic peripheral angiopathy without gangrene: Secondary | ICD-10-CM | POA: Diagnosis not present

## 2020-04-30 DIAGNOSIS — N183 Chronic kidney disease, stage 3 unspecified: Secondary | ICD-10-CM | POA: Diagnosis not present

## 2020-04-30 DIAGNOSIS — E1122 Type 2 diabetes mellitus with diabetic chronic kidney disease: Secondary | ICD-10-CM | POA: Diagnosis not present

## 2020-05-02 DIAGNOSIS — E1151 Type 2 diabetes mellitus with diabetic peripheral angiopathy without gangrene: Secondary | ICD-10-CM | POA: Diagnosis not present

## 2020-05-02 DIAGNOSIS — N183 Chronic kidney disease, stage 3 unspecified: Secondary | ICD-10-CM | POA: Diagnosis not present

## 2020-05-02 DIAGNOSIS — E1122 Type 2 diabetes mellitus with diabetic chronic kidney disease: Secondary | ICD-10-CM | POA: Diagnosis not present

## 2020-05-02 DIAGNOSIS — M48061 Spinal stenosis, lumbar region without neurogenic claudication: Secondary | ICD-10-CM | POA: Diagnosis not present

## 2020-05-02 DIAGNOSIS — G20 Parkinson's disease: Secondary | ICD-10-CM | POA: Diagnosis not present

## 2020-05-02 DIAGNOSIS — I129 Hypertensive chronic kidney disease with stage 1 through stage 4 chronic kidney disease, or unspecified chronic kidney disease: Secondary | ICD-10-CM | POA: Diagnosis not present

## 2020-05-05 DIAGNOSIS — E1122 Type 2 diabetes mellitus with diabetic chronic kidney disease: Secondary | ICD-10-CM | POA: Diagnosis not present

## 2020-05-05 DIAGNOSIS — N183 Chronic kidney disease, stage 3 unspecified: Secondary | ICD-10-CM | POA: Diagnosis not present

## 2020-05-05 DIAGNOSIS — M48061 Spinal stenosis, lumbar region without neurogenic claudication: Secondary | ICD-10-CM | POA: Diagnosis not present

## 2020-05-05 DIAGNOSIS — E1151 Type 2 diabetes mellitus with diabetic peripheral angiopathy without gangrene: Secondary | ICD-10-CM | POA: Diagnosis not present

## 2020-05-05 DIAGNOSIS — G20 Parkinson's disease: Secondary | ICD-10-CM | POA: Diagnosis not present

## 2020-05-05 DIAGNOSIS — I129 Hypertensive chronic kidney disease with stage 1 through stage 4 chronic kidney disease, or unspecified chronic kidney disease: Secondary | ICD-10-CM | POA: Diagnosis not present

## 2020-05-06 DIAGNOSIS — M48061 Spinal stenosis, lumbar region without neurogenic claudication: Secondary | ICD-10-CM | POA: Diagnosis not present

## 2020-05-06 DIAGNOSIS — E1122 Type 2 diabetes mellitus with diabetic chronic kidney disease: Secondary | ICD-10-CM | POA: Diagnosis not present

## 2020-05-06 DIAGNOSIS — E1151 Type 2 diabetes mellitus with diabetic peripheral angiopathy without gangrene: Secondary | ICD-10-CM | POA: Diagnosis not present

## 2020-05-06 DIAGNOSIS — N183 Chronic kidney disease, stage 3 unspecified: Secondary | ICD-10-CM | POA: Diagnosis not present

## 2020-05-06 DIAGNOSIS — G20 Parkinson's disease: Secondary | ICD-10-CM | POA: Diagnosis not present

## 2020-05-06 DIAGNOSIS — I129 Hypertensive chronic kidney disease with stage 1 through stage 4 chronic kidney disease, or unspecified chronic kidney disease: Secondary | ICD-10-CM | POA: Diagnosis not present

## 2020-05-07 DIAGNOSIS — G20 Parkinson's disease: Secondary | ICD-10-CM | POA: Diagnosis not present

## 2020-05-07 DIAGNOSIS — E1122 Type 2 diabetes mellitus with diabetic chronic kidney disease: Secondary | ICD-10-CM | POA: Diagnosis not present

## 2020-05-07 DIAGNOSIS — I129 Hypertensive chronic kidney disease with stage 1 through stage 4 chronic kidney disease, or unspecified chronic kidney disease: Secondary | ICD-10-CM | POA: Diagnosis not present

## 2020-05-07 DIAGNOSIS — E1151 Type 2 diabetes mellitus with diabetic peripheral angiopathy without gangrene: Secondary | ICD-10-CM | POA: Diagnosis not present

## 2020-05-07 DIAGNOSIS — N183 Chronic kidney disease, stage 3 unspecified: Secondary | ICD-10-CM | POA: Diagnosis not present

## 2020-05-07 DIAGNOSIS — M48061 Spinal stenosis, lumbar region without neurogenic claudication: Secondary | ICD-10-CM | POA: Diagnosis not present

## 2020-05-08 DIAGNOSIS — M48061 Spinal stenosis, lumbar region without neurogenic claudication: Secondary | ICD-10-CM | POA: Diagnosis not present

## 2020-05-08 DIAGNOSIS — E1122 Type 2 diabetes mellitus with diabetic chronic kidney disease: Secondary | ICD-10-CM | POA: Diagnosis not present

## 2020-05-08 DIAGNOSIS — I129 Hypertensive chronic kidney disease with stage 1 through stage 4 chronic kidney disease, or unspecified chronic kidney disease: Secondary | ICD-10-CM | POA: Diagnosis not present

## 2020-05-08 DIAGNOSIS — E1151 Type 2 diabetes mellitus with diabetic peripheral angiopathy without gangrene: Secondary | ICD-10-CM | POA: Diagnosis not present

## 2020-05-08 DIAGNOSIS — N183 Chronic kidney disease, stage 3 unspecified: Secondary | ICD-10-CM | POA: Diagnosis not present

## 2020-05-08 DIAGNOSIS — G20 Parkinson's disease: Secondary | ICD-10-CM | POA: Diagnosis not present

## 2020-05-14 DIAGNOSIS — N183 Chronic kidney disease, stage 3 unspecified: Secondary | ICD-10-CM | POA: Diagnosis not present

## 2020-05-14 DIAGNOSIS — E1122 Type 2 diabetes mellitus with diabetic chronic kidney disease: Secondary | ICD-10-CM | POA: Diagnosis not present

## 2020-05-14 DIAGNOSIS — G20 Parkinson's disease: Secondary | ICD-10-CM | POA: Diagnosis not present

## 2020-05-14 DIAGNOSIS — E1151 Type 2 diabetes mellitus with diabetic peripheral angiopathy without gangrene: Secondary | ICD-10-CM | POA: Diagnosis not present

## 2020-05-14 DIAGNOSIS — I129 Hypertensive chronic kidney disease with stage 1 through stage 4 chronic kidney disease, or unspecified chronic kidney disease: Secondary | ICD-10-CM | POA: Diagnosis not present

## 2020-05-14 DIAGNOSIS — M48061 Spinal stenosis, lumbar region without neurogenic claudication: Secondary | ICD-10-CM | POA: Diagnosis not present

## 2020-05-15 ENCOUNTER — Telehealth: Payer: Self-pay | Admitting: Internal Medicine

## 2020-05-15 NOTE — Telephone Encounter (Signed)
New Message:   Sonia Baller is calling from [facility] for orders for 1 additional day for PT this week. She is also requesting for recertification for 2x a week for and additional 4 weeks starting on 05/27/20. Please advise.

## 2020-05-15 NOTE — Telephone Encounter (Signed)
Left detailed message with verbal orders for below.  

## 2020-05-16 DIAGNOSIS — N183 Chronic kidney disease, stage 3 unspecified: Secondary | ICD-10-CM | POA: Diagnosis not present

## 2020-05-16 DIAGNOSIS — G20 Parkinson's disease: Secondary | ICD-10-CM | POA: Diagnosis not present

## 2020-05-16 DIAGNOSIS — I129 Hypertensive chronic kidney disease with stage 1 through stage 4 chronic kidney disease, or unspecified chronic kidney disease: Secondary | ICD-10-CM | POA: Diagnosis not present

## 2020-05-16 DIAGNOSIS — M48061 Spinal stenosis, lumbar region without neurogenic claudication: Secondary | ICD-10-CM | POA: Diagnosis not present

## 2020-05-16 DIAGNOSIS — E1122 Type 2 diabetes mellitus with diabetic chronic kidney disease: Secondary | ICD-10-CM | POA: Diagnosis not present

## 2020-05-16 DIAGNOSIS — E1151 Type 2 diabetes mellitus with diabetic peripheral angiopathy without gangrene: Secondary | ICD-10-CM | POA: Diagnosis not present

## 2020-05-23 DIAGNOSIS — M48061 Spinal stenosis, lumbar region without neurogenic claudication: Secondary | ICD-10-CM | POA: Diagnosis not present

## 2020-05-23 DIAGNOSIS — N183 Chronic kidney disease, stage 3 unspecified: Secondary | ICD-10-CM | POA: Diagnosis not present

## 2020-05-23 DIAGNOSIS — E1122 Type 2 diabetes mellitus with diabetic chronic kidney disease: Secondary | ICD-10-CM | POA: Diagnosis not present

## 2020-05-23 DIAGNOSIS — I129 Hypertensive chronic kidney disease with stage 1 through stage 4 chronic kidney disease, or unspecified chronic kidney disease: Secondary | ICD-10-CM | POA: Diagnosis not present

## 2020-05-23 DIAGNOSIS — G20 Parkinson's disease: Secondary | ICD-10-CM | POA: Diagnosis not present

## 2020-05-23 DIAGNOSIS — E1151 Type 2 diabetes mellitus with diabetic peripheral angiopathy without gangrene: Secondary | ICD-10-CM | POA: Diagnosis not present

## 2020-05-27 DIAGNOSIS — I872 Venous insufficiency (chronic) (peripheral): Secondary | ICD-10-CM | POA: Diagnosis not present

## 2020-05-27 DIAGNOSIS — Z7901 Long term (current) use of anticoagulants: Secondary | ICD-10-CM | POA: Diagnosis not present

## 2020-05-27 DIAGNOSIS — Z9181 History of falling: Secondary | ICD-10-CM | POA: Diagnosis not present

## 2020-05-27 DIAGNOSIS — N183 Chronic kidney disease, stage 3 unspecified: Secondary | ICD-10-CM | POA: Diagnosis not present

## 2020-05-27 DIAGNOSIS — R627 Adult failure to thrive: Secondary | ICD-10-CM | POA: Diagnosis not present

## 2020-05-27 DIAGNOSIS — E785 Hyperlipidemia, unspecified: Secondary | ICD-10-CM | POA: Diagnosis not present

## 2020-05-27 DIAGNOSIS — I129 Hypertensive chronic kidney disease with stage 1 through stage 4 chronic kidney disease, or unspecified chronic kidney disease: Secondary | ICD-10-CM | POA: Diagnosis not present

## 2020-05-27 DIAGNOSIS — Z7982 Long term (current) use of aspirin: Secondary | ICD-10-CM | POA: Diagnosis not present

## 2020-05-27 DIAGNOSIS — G20 Parkinson's disease: Secondary | ICD-10-CM | POA: Diagnosis not present

## 2020-05-27 DIAGNOSIS — Z7984 Long term (current) use of oral hypoglycemic drugs: Secondary | ICD-10-CM | POA: Diagnosis not present

## 2020-05-27 DIAGNOSIS — E1122 Type 2 diabetes mellitus with diabetic chronic kidney disease: Secondary | ICD-10-CM | POA: Diagnosis not present

## 2020-05-27 DIAGNOSIS — E1151 Type 2 diabetes mellitus with diabetic peripheral angiopathy without gangrene: Secondary | ICD-10-CM | POA: Diagnosis not present

## 2020-05-27 DIAGNOSIS — I82412 Acute embolism and thrombosis of left femoral vein: Secondary | ICD-10-CM | POA: Diagnosis not present

## 2020-05-27 DIAGNOSIS — M48061 Spinal stenosis, lumbar region without neurogenic claudication: Secondary | ICD-10-CM | POA: Diagnosis not present

## 2020-05-27 DIAGNOSIS — C61 Malignant neoplasm of prostate: Secondary | ICD-10-CM | POA: Diagnosis not present

## 2020-05-28 DIAGNOSIS — E1151 Type 2 diabetes mellitus with diabetic peripheral angiopathy without gangrene: Secondary | ICD-10-CM | POA: Diagnosis not present

## 2020-05-28 DIAGNOSIS — G20 Parkinson's disease: Secondary | ICD-10-CM | POA: Diagnosis not present

## 2020-05-28 DIAGNOSIS — M48061 Spinal stenosis, lumbar region without neurogenic claudication: Secondary | ICD-10-CM | POA: Diagnosis not present

## 2020-05-28 DIAGNOSIS — I129 Hypertensive chronic kidney disease with stage 1 through stage 4 chronic kidney disease, or unspecified chronic kidney disease: Secondary | ICD-10-CM | POA: Diagnosis not present

## 2020-05-28 DIAGNOSIS — E1122 Type 2 diabetes mellitus with diabetic chronic kidney disease: Secondary | ICD-10-CM | POA: Diagnosis not present

## 2020-05-28 DIAGNOSIS — N183 Chronic kidney disease, stage 3 unspecified: Secondary | ICD-10-CM | POA: Diagnosis not present

## 2020-05-30 DIAGNOSIS — E1151 Type 2 diabetes mellitus with diabetic peripheral angiopathy without gangrene: Secondary | ICD-10-CM | POA: Diagnosis not present

## 2020-05-30 DIAGNOSIS — G20 Parkinson's disease: Secondary | ICD-10-CM | POA: Diagnosis not present

## 2020-05-30 DIAGNOSIS — N183 Chronic kidney disease, stage 3 unspecified: Secondary | ICD-10-CM | POA: Diagnosis not present

## 2020-05-30 DIAGNOSIS — I129 Hypertensive chronic kidney disease with stage 1 through stage 4 chronic kidney disease, or unspecified chronic kidney disease: Secondary | ICD-10-CM | POA: Diagnosis not present

## 2020-05-30 DIAGNOSIS — M48061 Spinal stenosis, lumbar region without neurogenic claudication: Secondary | ICD-10-CM | POA: Diagnosis not present

## 2020-05-30 DIAGNOSIS — E1122 Type 2 diabetes mellitus with diabetic chronic kidney disease: Secondary | ICD-10-CM | POA: Diagnosis not present

## 2020-06-02 DIAGNOSIS — M48061 Spinal stenosis, lumbar region without neurogenic claudication: Secondary | ICD-10-CM | POA: Diagnosis not present

## 2020-06-02 DIAGNOSIS — E1151 Type 2 diabetes mellitus with diabetic peripheral angiopathy without gangrene: Secondary | ICD-10-CM | POA: Diagnosis not present

## 2020-06-02 DIAGNOSIS — E1122 Type 2 diabetes mellitus with diabetic chronic kidney disease: Secondary | ICD-10-CM | POA: Diagnosis not present

## 2020-06-02 DIAGNOSIS — G20 Parkinson's disease: Secondary | ICD-10-CM | POA: Diagnosis not present

## 2020-06-02 DIAGNOSIS — N183 Chronic kidney disease, stage 3 unspecified: Secondary | ICD-10-CM | POA: Diagnosis not present

## 2020-06-02 DIAGNOSIS — I129 Hypertensive chronic kidney disease with stage 1 through stage 4 chronic kidney disease, or unspecified chronic kidney disease: Secondary | ICD-10-CM | POA: Diagnosis not present

## 2020-06-04 ENCOUNTER — Telehealth (INDEPENDENT_AMBULATORY_CARE_PROVIDER_SITE_OTHER): Payer: Medicare Other | Admitting: Neurology

## 2020-06-04 ENCOUNTER — Encounter: Payer: Self-pay | Admitting: Neurology

## 2020-06-04 ENCOUNTER — Other Ambulatory Visit: Payer: Self-pay

## 2020-06-04 VITALS — Ht 73.0 in | Wt 220.0 lb

## 2020-06-04 DIAGNOSIS — N183 Chronic kidney disease, stage 3 unspecified: Secondary | ICD-10-CM | POA: Diagnosis not present

## 2020-06-04 DIAGNOSIS — G20 Parkinson's disease: Secondary | ICD-10-CM | POA: Diagnosis not present

## 2020-06-04 DIAGNOSIS — E1122 Type 2 diabetes mellitus with diabetic chronic kidney disease: Secondary | ICD-10-CM | POA: Diagnosis not present

## 2020-06-04 DIAGNOSIS — M48061 Spinal stenosis, lumbar region without neurogenic claudication: Secondary | ICD-10-CM | POA: Diagnosis not present

## 2020-06-04 DIAGNOSIS — E1151 Type 2 diabetes mellitus with diabetic peripheral angiopathy without gangrene: Secondary | ICD-10-CM | POA: Diagnosis not present

## 2020-06-04 DIAGNOSIS — I129 Hypertensive chronic kidney disease with stage 1 through stage 4 chronic kidney disease, or unspecified chronic kidney disease: Secondary | ICD-10-CM | POA: Diagnosis not present

## 2020-06-04 MED ORDER — CARBIDOPA-LEVODOPA 25-100 MG PO TABS: ORAL_TABLET | ORAL | 2 refills | Status: DC

## 2020-06-04 NOTE — Progress Notes (Signed)
Due to the COVID-19 crisis, this virtual check-in visit was done via telephone from my office and it was initiated and consent given by this patient and or family.  Telephone (Audio) Visit The purpose of this telephone visit is to provide medical care while limiting exposure to the novel coronavirus.    Consent was obtained for telephone visit and initiated by pt/family:  Yes.   Answered questions that patient had about telehealth interaction:  Yes.   I discussed the limitations, risks, security and privacy concerns of performing an evaluation and management service by telephone. I also discussed with the patient that there may be a patient responsible charge related to this service. The patient expressed understanding and agreed to proceed.  Pt location: Home Physician Location: office Name of referring provider:  Plotnikov, Edward Lacks, MD I connected with .Edward [name] at patients initiation/request on 06/04/2020 at 11:15 AM EDT by telephone and verified that I am speaking with the correct person using two identifiers.  Pt MRN:  [id number] Pt DOB:  07-13-1933  Assessment/Plan:   1.  Bradykinesia  -Have not seen the patient in person in a year.  Patient had no video capability.  Expressed that I really would like to see him in person next time but wife states that is impossible right now because of cost of transportation.  Atypical state, particularly PSP is on the top of the differential, esp given decline to WC.  DaTscan was abnormal.  Pt apparently now WC bound so suspect that he does have PSP  -Patient declines EMG  -Patient declined neurocognitive testing.  -Continue carbidopa/levodopa 25/100, 2/2/1   Subjective:  Patient seen today in follow-up for bradykinesia.  Wife on phone and supplements hx.  Unfortunately, patient/wife stated that they did not know about the visit so ultimately it was a phone visit.  Have not seen the patient in a year.  Patient had an appointment in  December, but wife canceled the appointment stating that he was declining.  Patient was referred for neurocognitive testing but declined to schedule once we called for the appointment.  Wife states today that pt now WC bound x 4 months.  PT from [facility] coming to house to help with transfers.  Wife hiring caregivers 4 hours per day, 1 day a week.  Plotnikov, Edward Lacks, MD notes reviewed and he just wrote "gait abnormal."  Pt swallowing well.  Pt without double vision.  Pt having more slurring of speech.  Had a couple of "slips" to the floor during transfers.    Current Movement d/o meds: Carbidopa/levodopa 25/100, 2/2/1   Objective:   Vitals:   06/04/20 1037  Weight: 220 lb (99.8 kg)  Height: 6\' 1"  (1.854 m)   Pt and I talked today and his speech was fluent and clear  Follow Up Instructions:      -I discussed the assessment and treatment plan with the patient. The patient was provided an opportunity to ask questions and all were answered. The patient agreed with the plan and demonstrated an understanding of the instructions.   The patient was advised to call back or seek an in-person evaluation if the symptoms worsen or if the condition fails to improve as anticipated.    Total Time spent in visit with the patient was:  12 min, of which 100% of the time was spent in counseling and/or coordinating care on safety.   Pt understands and agrees with the plan of care outlined.     Edward Guiles  Geovany Trudo, DO

## 2020-06-09 ENCOUNTER — Telehealth: Payer: Self-pay | Admitting: Internal Medicine

## 2020-06-09 DIAGNOSIS — I129 Hypertensive chronic kidney disease with stage 1 through stage 4 chronic kidney disease, or unspecified chronic kidney disease: Secondary | ICD-10-CM | POA: Diagnosis not present

## 2020-06-09 DIAGNOSIS — M48061 Spinal stenosis, lumbar region without neurogenic claudication: Secondary | ICD-10-CM | POA: Diagnosis not present

## 2020-06-09 DIAGNOSIS — E1122 Type 2 diabetes mellitus with diabetic chronic kidney disease: Secondary | ICD-10-CM | POA: Diagnosis not present

## 2020-06-09 DIAGNOSIS — E1151 Type 2 diabetes mellitus with diabetic peripheral angiopathy without gangrene: Secondary | ICD-10-CM | POA: Diagnosis not present

## 2020-06-09 DIAGNOSIS — N183 Chronic kidney disease, stage 3 unspecified: Secondary | ICD-10-CM | POA: Diagnosis not present

## 2020-06-09 DIAGNOSIS — G20 Parkinson's disease: Secondary | ICD-10-CM | POA: Diagnosis not present

## 2020-06-09 NOTE — Telephone Encounter (Signed)
Called Edward [name] and left a detailed message with verbal order requested.

## 2020-06-09 NOTE — Telephone Encounter (Signed)
    Edward [name] from [facility] calling for order to discontinue PT. Patient has met their goal.  Phone [phone]

## 2020-06-11 ENCOUNTER — Telehealth: Payer: Self-pay | Admitting: Internal Medicine

## 2020-06-11 DIAGNOSIS — N183 Chronic kidney disease, stage 3 unspecified: Secondary | ICD-10-CM | POA: Diagnosis not present

## 2020-06-11 DIAGNOSIS — I129 Hypertensive chronic kidney disease with stage 1 through stage 4 chronic kidney disease, or unspecified chronic kidney disease: Secondary | ICD-10-CM | POA: Diagnosis not present

## 2020-06-11 DIAGNOSIS — E1151 Type 2 diabetes mellitus with diabetic peripheral angiopathy without gangrene: Secondary | ICD-10-CM | POA: Diagnosis not present

## 2020-06-11 DIAGNOSIS — G20 Parkinson's disease: Secondary | ICD-10-CM | POA: Diagnosis not present

## 2020-06-11 DIAGNOSIS — M48061 Spinal stenosis, lumbar region without neurogenic claudication: Secondary | ICD-10-CM | POA: Diagnosis not present

## 2020-06-11 DIAGNOSIS — E1122 Type 2 diabetes mellitus with diabetic chronic kidney disease: Secondary | ICD-10-CM | POA: Diagnosis not present

## 2020-06-11 NOTE — Telephone Encounter (Signed)
New message:   Raquel Sarna is calling from [employer] in regards to a power wheel chair for the pt. Please advise.

## 2020-06-12 NOTE — Telephone Encounter (Signed)
I returned Emily's call , left a vm for her to call back

## 2020-06-13 NOTE — Telephone Encounter (Signed)
Spoke with pt's wife and scheduled him an OV for 6/30 @ 3:20

## 2020-06-13 NOTE — Telephone Encounter (Signed)
New message:   Pt is returning call. Please advise.

## 2020-06-13 NOTE — Telephone Encounter (Signed)
Spoke with Raquel Sarna and she states that the notes provided by Dr. Alain Marion discusses the need for a maunal wheel chair however he needs and order for a power wheel chair. States Pt will have to have a whole new visits that discusses the need for a power wheelchair. I contacted pt to make an appointment, unable to get in touch with pt, left vm for pt call back

## 2020-06-18 ENCOUNTER — Encounter: Payer: Self-pay | Admitting: Internal Medicine

## 2020-06-18 ENCOUNTER — Other Ambulatory Visit: Payer: Self-pay

## 2020-06-18 ENCOUNTER — Ambulatory Visit (INDEPENDENT_AMBULATORY_CARE_PROVIDER_SITE_OTHER): Payer: Medicare Other | Admitting: Internal Medicine

## 2020-06-18 DIAGNOSIS — R269 Unspecified abnormalities of gait and mobility: Secondary | ICD-10-CM

## 2020-06-18 DIAGNOSIS — E119 Type 2 diabetes mellitus without complications: Secondary | ICD-10-CM | POA: Diagnosis not present

## 2020-06-18 DIAGNOSIS — G20 Parkinson's disease: Secondary | ICD-10-CM | POA: Diagnosis not present

## 2020-06-18 DIAGNOSIS — G8929 Other chronic pain: Secondary | ICD-10-CM

## 2020-06-18 DIAGNOSIS — I10 Essential (primary) hypertension: Secondary | ICD-10-CM | POA: Diagnosis not present

## 2020-06-18 DIAGNOSIS — M545 Low back pain: Secondary | ICD-10-CM

## 2020-06-18 DIAGNOSIS — M48062 Spinal stenosis, lumbar region with neurogenic claudication: Secondary | ICD-10-CM

## 2020-06-18 LAB — HEMOGLOBIN A1C: Hgb A1c MFr Bld: 6.7 % — ABNORMAL HIGH (ref 4.6–6.5)

## 2020-06-18 LAB — BASIC METABOLIC PANEL
BUN: 25 mg/dL — ABNORMAL HIGH (ref 6–23)
CO2: 28 mEq/L (ref 19–32)
Calcium: 9 mg/dL (ref 8.4–10.5)
Chloride: 102 mEq/L (ref 96–112)
Creatinine, Ser: 1.82 mg/dL — ABNORMAL HIGH (ref 0.40–1.50)
GFR: 43.05 mL/min — ABNORMAL LOW (ref >60.00)
Glucose, Bld: 139 mg/dL — ABNORMAL HIGH (ref 70–99)
Potassium: 4.7 mEq/L (ref 3.5–5.1)
Sodium: 139 mEq/L (ref 135–145)

## 2020-06-18 NOTE — Assessment & Plan Note (Signed)
BP Readings from Last 3 Encounters:  06/18/20 120/70  03/20/20 (!) 144/80  10/25/19 136/82

## 2020-06-18 NOTE — Assessment & Plan Note (Signed)
On Sinemet 

## 2020-06-18 NOTE — Assessment & Plan Note (Signed)
Start PT w/Edward [name] [employer]) when power w/c arrives.

## 2020-06-18 NOTE — Assessment & Plan Note (Signed)
Start PT w/[name] [employer]) when power w/c arrives.

## 2020-06-18 NOTE — Progress Notes (Signed)
   Reason for Visit: Mobility Evaluation  Patient suffers from Parkinson's disease and lower extremity weakness which impairs their ability to perform daily activities like toileting, feeding, dressing, grooming, bathing in the home.   A power wheel chair will assist the patient in the home with ADLs; including cooking, cleaning and toilet ing. A cane, walker or manual wheel chair will not assist patient efficiently due to obesity, upper extremity pain and weakness. In addition, a power scooter will not be adequate due to the patients lack of ability to grip the tiller type steering of the equipment. Patient has the capacity to follow the electric devices safety instructions and is willing to use in the home as well as outside of the home.     ADD the Upper and Lower Extremity Strength Measurement 0-5 scale

## 2020-06-18 NOTE — Patient Instructions (Signed)
"  Start PT w/[name] [employer]) when power w/c arrives."

## 2020-06-18 NOTE — Assessment & Plan Note (Signed)
Metformin 

## 2020-06-18 NOTE — Assessment & Plan Note (Signed)
Ref to PT. Start PT w/[name] [employer]) when power w/c arrives.

## 2020-06-18 NOTE — Progress Notes (Signed)
Subjective:  Patient ID: Edward [name], male    DOB: 09-Jul-1933  Age: 86 y.o. MRN: [id number]  CC: No chief complaint on file.   HPI QUINNTON BURY presents for LE weakness, Parkinson's, DM f/u  Outpatient Medications Prior to Visit  Medication Sig Dispense Refill  . apixaban (ELIQUIS) 5 MG TABS tablet Take 1 tablet (5 mg total) by mouth 2 (two) times daily. 180 tablet 3  . aspirin EC 81 MG tablet Take 81 mg by mouth daily.    . brimonidine (ALPHAGAN P) 0.1 % SOLN Place 1 drop into both eyes 2 (two) times daily.    . candesartan (ATACAND) 32 MG tablet Take 1 tablet (32 mg total) by mouth daily. 90 tablet 3  . carbidopa-levodopa (SINEMET IR) 25-100 MG tablet 2 in the morning, 2 in the afternoon, 1 in the evening 572 tablet 2  . folic acid (FOLVITE) 1 MG tablet Take 1 tablet (1 mg total) by mouth daily. 90 tablet 3  . furosemide (LASIX) 20 MG tablet Take 20-40 mgs by mouth 3 times per week as needed for leg swelling 90 tablet 3  . gabapentin (NEURONTIN) 300 MG capsule Take 1 capsule by mouth three times daily as needed 270 capsule 1  . metFORMIN (GLUCOPHAGE) 500 MG tablet Take 1 tablet (500 mg total) by mouth 2 (two) times daily with a meal. 180 tablet 3  . Multiple Vitamins-Minerals (CENTRUM SILVER) tablet Take 1 tablet by mouth daily. Reported on 05/25/2016    . niacin (NIASPAN) 500 MG CR tablet Take 1 tablet (500 mg total) by mouth daily. 90 tablet 3  . ROCKLATAN 0.02-0.005 % SOLN Place 1 drop into both eyes at bedtime.    . vitamin B-12 (CYANOCOBALAMIN) 1000 MCG tablet Take 1,000 mcg by mouth daily.    . Vitamin D, Ergocalciferol, (DRISDOL) 1.25 MG (50000 UT) CAPS capsule Take 1 capsule (50,000 Units total) by mouth every 30 (thirty) days. First of the month 3 capsule 3   No facility-administered medications prior to visit.    ROS: Review of Systems  Constitutional: Positive for fatigue. Negative for appetite change and unexpected weight change.  HENT: Negative for congestion,  nosebleeds, sneezing, sore throat and trouble swallowing.   Eyes: Negative for itching and visual disturbance.  Respiratory: Negative for cough.   Cardiovascular: Negative for chest pain, palpitations and leg swelling.  Gastrointestinal: Negative for abdominal distention, blood in stool, diarrhea and nausea.  Genitourinary: Negative for frequency and hematuria.  Musculoskeletal: Positive for back pain and gait problem. Negative for joint swelling and neck pain.  Skin: Negative for rash.  Neurological: Positive for weakness. Negative for dizziness, tremors and speech difficulty.  Psychiatric/Behavioral: Positive for decreased concentration. Negative for agitation, dysphoric mood and sleep disturbance. The patient is not nervous/anxious.     Objective:  BP 120/70 (BP Location: Left Arm, Patient Position: Sitting, Cuff Size: Large)   Pulse 80   Temp 98.3 F (36.8 C) (Oral)   Ht 6\' 1"  (1.854 m)   SpO2 94%   BMI 29.03 kg/m   BP Readings from Last 3 Encounters:  06/18/20 120/70  03/20/20 (!) 144/80  10/25/19 136/82    Wt Readings from Last 3 Encounters:  06/04/20 220 lb (99.8 kg)  06/19/19 214 lb (97.1 kg)  06/06/19 213 lb (96.6 kg)    Physical Exam Constitutional:      General: He is not in acute distress.    Appearance: He is well-developed.     Comments: NAD  Eyes:     Conjunctiva/sclera: Conjunctivae normal.     Pupils: Pupils are equal, round, and reactive to light.  Neck:     Thyroid: No thyromegaly.     Vascular: No JVD.  Cardiovascular:     Rate and Rhythm: Normal rate and regular rhythm.     Heart sounds: Normal heart sounds. No murmur heard.  No friction rub. No gallop.   Pulmonary:     Effort: Pulmonary effort is normal. No respiratory distress.     Breath sounds: Normal breath sounds. No wheezing or rales.  Chest:     Chest wall: No tenderness.  Abdominal:     General: Bowel sounds are normal. There is no distension.     Palpations: Abdomen is soft. There  is no mass.     Tenderness: There is no abdominal tenderness. There is no guarding or rebound.  Musculoskeletal:        General: No tenderness. Normal range of motion.     Cervical back: Normal range of motion.  Lymphadenopathy:     Cervical: No cervical adenopathy.  Skin:    General: Skin is warm and dry.     Findings: No rash.  Neurological:     Mental Status: He is alert and oriented to person, place, and time.     Cranial Nerves: No cranial nerve deficit.     Motor: Weakness present. No abnormal muscle tone.     Coordination: Coordination abnormal.     Gait: Gait abnormal.     Deep Tendon Reflexes: Reflexes are normal and symmetric.  Psychiatric:        Behavior: Behavior normal.        Thought Content: Thought content normal.        Judgment: Judgment normal.    In a w/c Weak legs   Patient has mobility limitations which cannot be resolved with a cane, crutch or walker. Patient can safely self propel the lightweight wheelchair but cannot safely self propel the heavier standard weight wheelchair.   Lab Results  Component Value Date   WBC 6.7 09/06/2019   HGB 14.4 09/06/2019   HCT 42.5 09/06/2019   PLT 201.0 09/06/2019   GLUCOSE 100 (H) 09/06/2019   CHOL 152 09/24/2015   TRIG 151.0 (H) 09/24/2015   HDL 34.10 (L) 09/24/2015   LDLCALC 88 09/24/2015   ALT 6 09/06/2019   AST 14 09/06/2019   NA 140 09/06/2019   K 4.7 09/06/2019   CL 102 09/06/2019   CREATININE 1.60 (H) 09/06/2019   BUN 29 (H) 09/06/2019   CO2 29 09/06/2019   TSH 1.14 09/06/2019   PSA 0.01 (L) 09/06/2019   INR 0.96 04/14/2017   HGBA1C 6.3 09/06/2019    DG HIP UNILAT WITH PELVIS 2-3 VIEWS RIGHT  Result Date: 09/07/2019 CLINICAL DATA:  Fall 2 weeks prior EXAM: DG HIP (WITH OR WITHOUT PELVIS) 2-3V RIGHT COMPARISON:  PET-CT May 16, 2014 FINDINGS: There is no evidence of hip fracture or dislocation. Moderate degenerative changes of the right hip with subchondral sclerosis and spurring of the  acetabulum. Enthesopathic changes are noted upon the right iliac crest and ischial tuberosities. Minimal degenerative changes are present in the right SI joint. Metallic radiodensities at the level of the prostate could reflect either fiducial or brachytherapy seeds, correlate with procedure history. Vascular calcifications are noted in medial right thigh. IMPRESSION: 1. No acute osseous abnormality. 2. Moderate right hip degenerative change. Electronically Signed   By: Elwin Sleight.D.  On: 09/07/2019 03:52    Assessment & Plan:   There are no diagnoses linked to this encounter.   No orders of the defined types were placed in this encounter.    Follow-up: No follow-ups on file.  Walker Kehr, MD

## 2020-06-20 ENCOUNTER — Telehealth: Payer: Self-pay

## 2020-06-20 NOTE — Telephone Encounter (Signed)
New message    Calling for test results  

## 2020-06-20 NOTE — Telephone Encounter (Signed)
Routed to wrong person?

## 2020-06-20 NOTE — Telephone Encounter (Signed)
See 06/18/20 labs. Patient's wife was informed of results.

## 2020-07-14 DIAGNOSIS — H2513 Age-related nuclear cataract, bilateral: Secondary | ICD-10-CM | POA: Diagnosis not present

## 2020-07-14 DIAGNOSIS — H401133 Primary open-angle glaucoma, bilateral, severe stage: Secondary | ICD-10-CM | POA: Diagnosis not present

## 2020-07-23 ENCOUNTER — Telehealth: Payer: Self-pay

## 2020-07-29 ENCOUNTER — Ambulatory Visit (INDEPENDENT_AMBULATORY_CARE_PROVIDER_SITE_OTHER): Payer: Medicare Other | Admitting: Internal Medicine

## 2020-07-29 ENCOUNTER — Other Ambulatory Visit: Payer: Self-pay

## 2020-07-29 ENCOUNTER — Encounter: Payer: Self-pay | Admitting: Internal Medicine

## 2020-07-29 DIAGNOSIS — G8929 Other chronic pain: Secondary | ICD-10-CM

## 2020-07-29 DIAGNOSIS — I10 Essential (primary) hypertension: Secondary | ICD-10-CM | POA: Diagnosis not present

## 2020-07-29 DIAGNOSIS — M48062 Spinal stenosis, lumbar region with neurogenic claudication: Secondary | ICD-10-CM | POA: Diagnosis not present

## 2020-07-29 DIAGNOSIS — G20 Parkinson's disease: Secondary | ICD-10-CM

## 2020-07-29 DIAGNOSIS — M545 Low back pain: Secondary | ICD-10-CM | POA: Diagnosis not present

## 2020-07-29 NOTE — Patient Instructions (Addendum)
  [employer]   Edward [name] has mobility limitations which cannot be resolved with a cane, crutch or walker. The patient is unable to stand w/o help,unable to walk due to leg weakness and lack of balance (Parkinson's disease and spinal stenosis). He has been falling. Patient can not safely self propel the lightweight wheelchair but can safely use controls on a power wheelchair. Please provide him w/a power wheelchair ASAP.

## 2020-07-29 NOTE — Progress Notes (Signed)
[employer] called to verify the information needed for power w/c.  Chart notes for todays faxed to [phone] instruction of Felipa Furnace & request of PCP.

## 2020-07-29 NOTE — Assessment & Plan Note (Signed)
  Patient has mobility limitations which cannot be resolved with a cane, crutch or walker. The patient is unable to stand w/o help,unable to walk due to leg weakness and lack of balance (Parkinson's disease and spinal stenosis). He has been falling. Patient can not safely self propel the lightweight wheelchair but can safely use controls on a power wheelchair. Please provide him w/a power wheelchair ASAP. 

## 2020-07-29 NOTE — Assessment & Plan Note (Signed)
°  Patient has mobility limitations which cannot be resolved with a cane, crutch or walker. The patient is unable to stand w/o help,unable to walk due to leg weakness and lack of balance (Parkinson's disease and spinal stenosis). He has been falling. Patient can not safely self propel the lightweight wheelchair but can safely use controls on a power wheelchair. Please provide him w/a power wheelchair ASAP. 

## 2020-07-29 NOTE — Progress Notes (Signed)
Subjective:  Patient ID: Edward [name], male    DOB: 1933-02-23  Age: 86 y.o. MRN: [id number]  CC: Advice Only (would like to discuss getting power w/c)   HPI REXFORD PREVO presents for inability to walk, stand, use manual w/c  Outpatient Medications Prior to Visit  Medication Sig Dispense Refill  . apixaban (ELIQUIS) 5 MG TABS tablet Take 1 tablet (5 mg total) by mouth 2 (two) times daily. 180 tablet 3  . aspirin EC 81 MG tablet Take 81 mg by mouth daily.    . brimonidine (ALPHAGAN) 0.2 % ophthalmic solution 1 drop 2 (two) times daily.    . candesartan (ATACAND) 32 MG tablet Take 1 tablet (32 mg total) by mouth daily. 90 tablet 3  . carbidopa-levodopa (SINEMET IR) 25-100 MG tablet 2 in the morning, 2 in the afternoon, 1 in the evening 272 tablet 2  . folic acid (FOLVITE) 1 MG tablet Take 1 tablet (1 mg total) by mouth daily. 90 tablet 3  . furosemide (LASIX) 20 MG tablet Take 20-40 mgs by mouth 3 times per week as needed for leg swelling 90 tablet 3  . gabapentin (NEURONTIN) 300 MG capsule Take 1 capsule by mouth three times daily as needed 270 capsule 1  . metFORMIN (GLUCOPHAGE) 500 MG tablet Take 1 tablet (500 mg total) by mouth 2 (two) times daily with a meal. 180 tablet 3  . Multiple Vitamins-Minerals (CENTRUM SILVER) tablet Take 1 tablet by mouth daily. Reported on 05/25/2016    . niacin (NIASPAN) 500 MG CR tablet Take 1 tablet (500 mg total) by mouth daily. 90 tablet 3  . ROCKLATAN 0.02-0.005 % SOLN Place 1 drop into both eyes at bedtime.    . vitamin B-12 (CYANOCOBALAMIN) 1000 MCG tablet Take 1,000 mcg by mouth daily.    . Vitamin D, Ergocalciferol, (DRISDOL) 1.25 MG (50000 UT) CAPS capsule Take 1 capsule (50,000 Units total) by mouth every 30 (thirty) days. First of the month 3 capsule 3  . brimonidine (ALPHAGAN P) 0.1 % SOLN Place 1 drop into both eyes 2 (two) times daily.     No facility-administered medications prior to visit.    ROS: Review of Systems    Constitutional: Positive for fatigue. Negative for appetite change and unexpected weight change.  HENT: Negative for congestion, nosebleeds, sneezing, sore throat and trouble swallowing.   Eyes: Negative for itching and visual disturbance.  Respiratory: Negative for cough.   Cardiovascular: Negative for chest pain, palpitations and leg swelling.  Gastrointestinal: Negative for abdominal distention, blood in stool, diarrhea and nausea.  Genitourinary: Negative for frequency and hematuria.  Musculoskeletal: Positive for arthralgias, back pain and gait problem. Negative for joint swelling and neck pain.  Skin: Negative for rash.  Neurological: Positive for tremors and weakness. Negative for dizziness and speech difficulty.  Psychiatric/Behavioral: Positive for decreased concentration. Negative for agitation, dysphoric mood and sleep disturbance. The patient is not nervous/anxious.     Objective:  BP 120/76 (BP Location: Left Arm, Patient Position: Sitting, Cuff Size: Large)   Pulse 69   Temp 97.9 F (36.6 C) (Oral)   Ht 6\' 1"  (1.854 m)   SpO2 97%   BMI 29.03 kg/m   BP Readings from Last 3 Encounters:  07/29/20 120/76  06/18/20 120/70  03/20/20 (!) 144/80    Wt Readings from Last 3 Encounters:  06/04/20 220 lb (99.8 kg)  06/19/19 214 lb (97.1 kg)  06/06/19 213 lb (96.6 kg)    Physical Exam Constitutional:  General: He is not in acute distress.    Appearance: He is well-developed.     Comments: NAD  Eyes:     Conjunctiva/sclera: Conjunctivae normal.     Pupils: Pupils are equal, round, and reactive to light.  Neck:     Thyroid: No thyromegaly.     Vascular: No JVD.  Cardiovascular:     Rate and Rhythm: Normal rate and regular rhythm.     Heart sounds: Normal heart sounds. No murmur heard.  No friction rub. No gallop.   Pulmonary:     Effort: Pulmonary effort is normal. No respiratory distress.     Breath sounds: Normal breath sounds. No wheezing or rales.  Chest:      Chest wall: No tenderness.  Abdominal:     General: Bowel sounds are normal. There is no distension.     Palpations: Abdomen is soft. There is no mass.     Tenderness: There is no abdominal tenderness. There is no guarding or rebound.  Musculoskeletal:        General: No tenderness. Normal range of motion.     Cervical back: Normal range of motion.  Lymphadenopathy:     Cervical: No cervical adenopathy.  Skin:    General: Skin is warm and dry.     Findings: No rash.  Neurological:     Mental Status: He is alert.     Cranial Nerves: No cranial nerve deficit.     Motor: Weakness present. No abnormal muscle tone.     Coordination: Coordination abnormal.     Gait: Gait abnormal.     Deep Tendon Reflexes: Reflexes abnormal.  Psychiatric:        Behavior: Behavior normal.    Patient has mobility limitations which cannot be resolved with a cane, crutch or walker. The patient is unable to stand w/o help,unable to walk due to leg weakness and lack of balance (Parkinson's disease and spinal stenosis). He has been falling. Patient can not safely self propel the lightweight wheelchair but can safely use controls on a power wheelchair. Lab Results  Component Value Date   WBC 6.7 09/06/2019   HGB 14.4 09/06/2019   HCT 42.5 09/06/2019   PLT 201.0 09/06/2019   GLUCOSE 139 (H) 06/18/2020   CHOL 152 09/24/2015   TRIG 151.0 (H) 09/24/2015   HDL 34.10 (L) 09/24/2015   LDLCALC 88 09/24/2015   ALT 6 09/06/2019   AST 14 09/06/2019   NA 139 06/18/2020   K 4.7 06/18/2020   CL 102 06/18/2020   CREATININE 1.82 (H) 06/18/2020   BUN 25 (H) 06/18/2020   CO2 28 06/18/2020   TSH 1.14 09/06/2019   PSA 0.01 (L) 09/06/2019   INR 0.96 04/14/2017   HGBA1C 6.7 (H) 06/18/2020    DG HIP UNILAT WITH PELVIS 2-3 VIEWS RIGHT  Result Date: 09/07/2019 CLINICAL DATA:  Fall 2 weeks prior EXAM: DG HIP (WITH OR WITHOUT PELVIS) 2-3V RIGHT COMPARISON:  PET-CT May 16, 2014 FINDINGS: There is no evidence of hip  fracture or dislocation. Moderate degenerative changes of the right hip with subchondral sclerosis and spurring of the acetabulum. Enthesopathic changes are noted upon the right iliac crest and ischial tuberosities. Minimal degenerative changes are present in the right SI joint. Metallic radiodensities at the level of the prostate could reflect either fiducial or brachytherapy seeds, correlate with procedure history. Vascular calcifications are noted in medial right thigh. IMPRESSION: 1. No acute osseous abnormality. 2. Moderate right hip degenerative change. Electronically Signed  By: Lovena Le M.D.   On: 09/07/2019 03:52    Assessment & Plan:   There are no diagnoses linked to this encounter.   No orders of the defined types were placed in this encounter.    Follow-up: No follow-ups on file.  Walker Kehr, MD

## 2020-07-29 NOTE — Assessment & Plan Note (Signed)
BP Readings from Last 3 Encounters:  07/29/20 120/76  06/18/20 120/70  03/20/20 (!) 144/80

## 2020-07-31 ENCOUNTER — Telehealth: Payer: Self-pay | Admitting: Internal Medicine

## 2020-07-31 NOTE — Telephone Encounter (Signed)
New message:   Pt's wife is calling and states she would like a call back in reference to the pt needing a power wheelchair. Please advise.

## 2020-08-01 NOTE — Telephone Encounter (Signed)
Pt's wife states she gave you some paperwork to email to an Normand Sloop on his 07/29/2020 OV. Do you still have this paperwork?

## 2020-08-04 NOTE — Telephone Encounter (Signed)
I don't have any paperwork. Thanks,

## 2020-08-11 ENCOUNTER — Other Ambulatory Visit: Payer: Self-pay | Admitting: Internal Medicine

## 2020-08-14 NOTE — Telephone Encounter (Signed)
   [employer] calling, they are faxing forms related to getting patient power wheelchair

## 2020-08-15 NOTE — Telephone Encounter (Signed)
I have paperwork as of 08/14/20. Will have provider sign on Monday and fax back

## 2020-08-20 DIAGNOSIS — H2513 Age-related nuclear cataract, bilateral: Secondary | ICD-10-CM | POA: Diagnosis not present

## 2020-08-20 DIAGNOSIS — H40033 Anatomical narrow angle, bilateral: Secondary | ICD-10-CM | POA: Diagnosis not present

## 2020-08-20 DIAGNOSIS — H401133 Primary open-angle glaucoma, bilateral, severe stage: Secondary | ICD-10-CM | POA: Diagnosis not present

## 2020-08-28 NOTE — Telephone Encounter (Signed)
Paperwork Faxed

## 2020-09-01 ENCOUNTER — Telehealth: Payer: Self-pay | Admitting: Internal Medicine

## 2020-09-01 NOTE — Telephone Encounter (Signed)
    1.Medication Requested: apixaban (ELIQUIS) 5 MG TABS tablet candesartan (ATACAND) 32 MG tablet metFORMIN (GLUCOPHAGE) 052 MG tablet folic acid (FOLVITE) 1 MG tablet  2. Pharmacy (Name, Street, [place]):[facility] [place]  3. On Med List: yes  4. Last Visit with PCP: 07/29/20  5. Next visit date with PCP:   Agent: Please be advised that RX refills may take up to 3 business days. We ask that you follow-up with your pharmacy.

## 2020-09-02 DIAGNOSIS — H2513 Age-related nuclear cataract, bilateral: Secondary | ICD-10-CM | POA: Diagnosis not present

## 2020-09-02 DIAGNOSIS — H401133 Primary open-angle glaucoma, bilateral, severe stage: Secondary | ICD-10-CM | POA: Diagnosis not present

## 2020-09-02 DIAGNOSIS — H40033 Anatomical narrow angle, bilateral: Secondary | ICD-10-CM | POA: Diagnosis not present

## 2020-09-03 ENCOUNTER — Other Ambulatory Visit: Payer: Self-pay

## 2020-09-03 MED ORDER — METFORMIN HCL 500 MG PO TABS: 500.0000 mg | ORAL_TABLET | Freq: Two times a day (BID) | ORAL | 3 refills | Status: DC

## 2020-09-03 MED ORDER — APIXABAN 5 MG PO TABS
5.0000 mg | ORAL_TABLET | Freq: Two times a day (BID) | ORAL | 3 refills | Status: DC
Start: 2020-09-03 — End: 2020-12-16

## 2020-09-03 MED ORDER — FOLIC ACID 1 MG PO TABS: 1.0000 mg | ORAL_TABLET | Freq: Every day | ORAL | 3 refills | Status: DC

## 2020-09-03 NOTE — Telephone Encounter (Signed)
See 09/03/20 med refill 

## 2020-10-22 NOTE — Telephone Encounter (Signed)
Edward [name] from [employer] calling to make [place] aware they are sending over one of the forms back to [place] because it had the wrong name on it and they need it refilled out

## 2020-10-24 NOTE — Telephone Encounter (Signed)
Upon investigation in media tab, it is noted that the requested forms were signed by Dr Alain Marion on 09/23/20 & faxed.  Spoke to [place] & will refax the signed forms from that date; confirmed correct fax number.

## 2020-10-29 NOTE — Telephone Encounter (Signed)
   Caren Griffins calling to request order for powered wheelchair be refaxed to [phone], they did not receive order Phone [phone]

## 2020-10-29 NOTE — Telephone Encounter (Signed)
Refaxed wheelchair forms to [phone].Marland KitchenJohny Chess

## 2020-11-04 DIAGNOSIS — H2513 Age-related nuclear cataract, bilateral: Secondary | ICD-10-CM | POA: Diagnosis not present

## 2020-11-04 DIAGNOSIS — H401133 Primary open-angle glaucoma, bilateral, severe stage: Secondary | ICD-10-CM | POA: Diagnosis not present

## 2020-11-04 DIAGNOSIS — H40033 Anatomical narrow angle, bilateral: Secondary | ICD-10-CM | POA: Diagnosis not present

## 2020-11-04 NOTE — Telephone Encounter (Signed)
Caren Griffins from [employer] called again to clarify what was incomplete on the forms.  The fax that was sent back to her mentioned it was missing things on her end.    [phone]

## 2020-11-05 NOTE — Telephone Encounter (Signed)
Rec'd new [employer] today waiting on MD to sign.Marland KitchenJohny Chess

## 2020-11-07 NOTE — Telephone Encounter (Signed)
[employer] faxed back this am../lmb

## 2020-12-01 NOTE — Progress Notes (Signed)
Assessment/Plan:   1.  Possible PSP, although not completely convinced  -not sure that medication helping  -slightly increase carbidopa/levodopa 25/100, 2/2/2 (take at 9/noon/4pm)  -examination complicated by tendon injury years ago to L leg/knee  -DaT positive  2.  RBD  -start klonopin, 0.5 mg, 1/2 tablet at bed.  R/B/SE were discussed.  The opportunity to ask questions was given and they were answered to the best of my ability.  The patient expressed understanding and willingness to follow the outlined treatment protocols.   Subjective:   Edward [name] was seen today in follow up for bradykinetic state..   My previous records were reviewed prior to todays visit as well as outside records available to me. Wife supplements hx.   Last visit, and the prior, I told the patient that he really needed to come into the office.  He has been doing phone visits and I have not seen him for nearly 2 years.  I have been worried about an atypical state, particularly PSP, as the patient had declined to wheelchair-bound.  However, without being able to see him for 2 years, and with him only doing phone visits, it was impossible to tell.  Medical records since last visit are reviewed.  Patient saw Dr. Alain Marion in August for a power wheelchair.  As of November notes, they were still working on getting that.  Wife states today that it was denied by medicare stating that a manual could be used.  No falls since last televisit.  Will walk to the bathroom with the walker but has to scoot self to the doorway with [facility] then uses walker within BR.  He denies diplopia.  No swallow trouble.   Wife states that pt awakening and thinking that something is "nibbling his toes."  His wife will get up and wrap the feet up and it helps.  He will sometimes awaken at night and see a little girl.  He is awake but it arises out of sleep.    Current prescribed movement disorder medications: Carbidopa/levodopa 25/100,  2/2/1    ALLERGIES:   Allergies  Allergen Reactions  . Naproxen Sodium Other (See Comments)    low platelets    CURRENT MEDICATIONS:  Outpatient Encounter Medications as of 12/02/2020  Medication Sig  . apixaban (ELIQUIS) 5 MG TABS tablet Take 1 tablet (5 mg total) by mouth 2 (two) times daily.  Marland Kitchen aspirin EC 81 MG tablet Take 81 mg by mouth daily.  . brimonidine (ALPHAGAN) 0.2 % ophthalmic solution 1 drop 2 (two) times daily.  . candesartan (ATACAND) 32 MG tablet Take 1 tablet by mouth once daily  . carbidopa-levodopa (SINEMET IR) 25-100 MG tablet 2 in the morning, 2 in the afternoon, 1 in the evening  . folic acid (FOLVITE) 1 MG tablet Take 1 tablet (1 mg total) by mouth daily.  . furosemide (LASIX) 20 MG tablet Take 20-40 mgs by mouth 3 times per week as needed for leg swelling  . gabapentin (NEURONTIN) 300 MG capsule Take 1 capsule by mouth three times daily as needed  . metFORMIN (GLUCOPHAGE) 500 MG tablet Take 1 tablet (500 mg total) by mouth 2 (two) times daily with a meal.  . Multiple Vitamins-Minerals (CENTRUM SILVER) tablet Take 1 tablet by mouth daily. Reported on 05/25/2016  . niacin (NIASPAN) 500 MG CR tablet Take 1 tablet (500 mg total) by mouth daily.  Marland Kitchen ROCKLATAN 0.02-0.005 % SOLN Place 1 drop into both eyes at bedtime.  Marland Kitchen  vitamin B-12 (CYANOCOBALAMIN) 1000 MCG tablet Take 1,000 mcg by mouth daily.  . Vitamin D, Ergocalciferol, (DRISDOL) 1.25 MG (50000 UT) CAPS capsule Take 1 capsule (50,000 Units total) by mouth every 30 (thirty) days. First of the month   No facility-administered encounter medications on file as of 12/02/2020.    Objective:   PHYSICAL EXAMINATION:    VITALS:   Vitals:   12/02/20 1431  BP: (!) 152/86  Pulse: 71  SpO2: 99%  Height: 6\' 1"  (1.854 m)    GEN:  The patient appears stated age and is in NAD. HEENT:  Normocephalic, atraumatic.  The mucous membranes are moist. The superficial temporal arteries are without ropiness or tenderness. CV:   RRR Lungs:  CTAB Neck/HEME:  There are no carotid bruits bilaterally.  Neurological examination:  Orientation: The patient is alert and oriented x3. Cranial nerves: There is good facial symmetry with facial hypomimia. Eomi.  The speech is fluent and clear. Soft palate rises symmetrically and there is no tongue deviation. Hearing is intact to conversational tone. Sensation: Sensation is intact to light touch throughout Motor: Strength is at least antigravity x4.  Movement examination: Tone: There is nl tone in the UE/LE Abnormal movements: none Coordination:  There is no significant decremation with RAM's,  Gait and Station: The patient needs assistance out of the chair to arise (fairly significant help).  He is flexed at the waist.  He has been at the knees.  He has only able to ambulate a few steps even with a walker before he needs to sit.  I have reviewed and interpreted the following labs independently    Chemistry      Component Value Date/Time   NA 139 06/18/2020 1559   K 4.7 06/18/2020 1559   CL 102 06/18/2020 1559   CO2 28 06/18/2020 1559   BUN 25 (H) 06/18/2020 1559   CREATININE 1.82 (H) 06/18/2020 1559      Component Value Date/Time   CALCIUM 9.0 06/18/2020 1559   ALKPHOS 43 09/06/2019 1408   AST 14 09/06/2019 1408   ALT 6 09/06/2019 1408   BILITOT 0.3 09/06/2019 1408       Lab Results  Component Value Date   WBC 6.7 09/06/2019   HGB 14.4 09/06/2019   HCT 42.5 09/06/2019   MCV 95.8 09/06/2019   PLT 201.0 09/06/2019    Lab Results  Component Value Date   TSH 1.14 09/06/2019     Total time spent on today's visit was 30 minutes, including both face-to-face time and nonface-to-face time.  Time included that spent on review of records (prior notes available to me/labs/imaging if pertinent), discussing treatment and goals, answering patient's questions and coordinating care.  Cc:  Plotnikov, Evie Lacks, MD

## 2020-12-02 ENCOUNTER — Encounter: Payer: Self-pay | Admitting: Neurology

## 2020-12-02 ENCOUNTER — Ambulatory Visit (INDEPENDENT_AMBULATORY_CARE_PROVIDER_SITE_OTHER): Payer: Medicare Other | Admitting: Neurology

## 2020-12-02 ENCOUNTER — Other Ambulatory Visit: Payer: Self-pay

## 2020-12-02 VITALS — BP 152/86 | HR 71 | Ht 73.0 in

## 2020-12-02 DIAGNOSIS — G20 Parkinson's disease: Secondary | ICD-10-CM | POA: Diagnosis not present

## 2020-12-02 MED ORDER — CLONAZEPAM 0.5 MG PO TABS: 0.2500 mg | ORAL_TABLET | Freq: Every day | ORAL | 1 refills | Status: DC

## 2020-12-02 MED ORDER — CARBIDOPA-LEVODOPA 25-100 MG PO TABS
2.0000 | ORAL_TABLET | Freq: Three times a day (TID) | ORAL | 1 refills | Status: DC
Start: 2020-12-02 — End: 2021-06-01

## 2020-12-02 NOTE — Patient Instructions (Addendum)
1.  Take carbidopa/levodopa 25/100, 2 tablets at 9am/2 tablets at noon/2 tablets at 4pm.  Start Carbidopa Levodopa as follows:   As a reminder, carbidopa/levodopa can be taken at the same time as a carbohydrate, but we like to have you take your pill either 30 minutes before a protein source or 1 hour after as protein can interfere with carbidopa/levodopa absorption.  2.  Start clonazepam, 0.5 mg - 1/2 tablet at bedtime.    The physicians and staff at [facility] are committed to providing excellent care. You may receive a survey requesting feedback about your experience at our office. We strive to receive "very good" responses to the survey questions. If you feel that your experience would prevent you from giving the office a "very good " response, please contact our office to try to remedy the situation. We may be reached at [phone]. Thank you for taking the time out of your busy day to complete the survey.

## 2020-12-16 ENCOUNTER — Telehealth: Payer: Self-pay | Admitting: Neurology

## 2020-12-16 ENCOUNTER — Other Ambulatory Visit: Payer: Self-pay | Admitting: Internal Medicine

## 2020-12-16 NOTE — Telephone Encounter (Signed)
Sob started on and off on christmas eve, feeling tired. Stopped clonzaepam 2 nights ago. Pt feels fine today, no shortness of breathe today. FYI.symptoms, Er if needed.

## 2020-12-16 NOTE — Telephone Encounter (Signed)
Patient's wife called in stating she had to stop giving him the clonazepam due to him being listless, drowsy, his voice has become more hoarse, and has trouble breathing when moving from the wheelchair to the chair.

## 2021-01-07 ENCOUNTER — Ambulatory Visit: Payer: Medicare Other | Admitting: Internal Medicine

## 2021-01-28 ENCOUNTER — Other Ambulatory Visit: Payer: Self-pay

## 2021-01-29 ENCOUNTER — Ambulatory Visit (INDEPENDENT_AMBULATORY_CARE_PROVIDER_SITE_OTHER): Payer: Medicare Other | Admitting: Internal Medicine

## 2021-01-29 ENCOUNTER — Encounter: Payer: Self-pay | Admitting: Internal Medicine

## 2021-01-29 VITALS — BP 142/82 | HR 73 | Temp 97.4°F

## 2021-01-29 DIAGNOSIS — C61 Malignant neoplasm of prostate: Secondary | ICD-10-CM

## 2021-01-29 DIAGNOSIS — Z23 Encounter for immunization: Secondary | ICD-10-CM | POA: Diagnosis not present

## 2021-01-29 DIAGNOSIS — G20 Parkinson's disease: Secondary | ICD-10-CM | POA: Diagnosis not present

## 2021-01-29 DIAGNOSIS — E119 Type 2 diabetes mellitus without complications: Secondary | ICD-10-CM

## 2021-01-29 DIAGNOSIS — I10 Essential (primary) hypertension: Secondary | ICD-10-CM

## 2021-01-29 DIAGNOSIS — E785 Hyperlipidemia, unspecified: Secondary | ICD-10-CM

## 2021-01-29 DIAGNOSIS — M48062 Spinal stenosis, lumbar region with neurogenic claudication: Secondary | ICD-10-CM

## 2021-01-29 LAB — CBC WITH DIFFERENTIAL/PLATELET
Basophils Absolute: 0 10*3/uL (ref 0.0–0.1)
Basophils Relative: 0.4 % (ref 0.0–3.0)
Eosinophils Absolute: 0.1 10*3/uL (ref 0.0–0.7)
Eosinophils Relative: 2.3 % (ref 0.0–5.0)
HCT: 41.2 % (ref 39.0–52.0)
Hemoglobin: 14.1 g/dL (ref 13.0–17.0)
Lymphocytes Relative: 23.7 % (ref 12.0–46.0)
Lymphs Abs: 1.3 10*3/uL (ref 0.7–4.0)
MCHC: 34.3 g/dL (ref 30.0–36.0)
MCV: 93.1 fl (ref 78.0–100.0)
Monocytes Absolute: 0.5 10*3/uL (ref 0.1–1.0)
Monocytes Relative: 8.9 % (ref 3.0–12.0)
Neutro Abs: 3.6 10*3/uL (ref 1.4–7.7)
Neutrophils Relative %: 64.7 % (ref 43.0–77.0)
Platelets: 196 10*3/uL (ref 150.0–400.0)
RBC: 4.43 Mil/uL (ref 4.22–5.81)
RDW: 13.8 % (ref 11.5–15.5)
WBC: 5.6 10*3/uL (ref 4.0–10.5)

## 2021-01-29 LAB — LIPID PANEL
Cholesterol: 147 mg/dL (ref 0–200)
HDL: 29.6 mg/dL — ABNORMAL LOW (ref >39.00)
NonHDL: 117.1
Total CHOL/HDL Ratio: 5
Triglycerides: 208 mg/dL — ABNORMAL HIGH (ref 0.0–149.0)
VLDL: 41.6 mg/dL — ABNORMAL HIGH (ref 0.0–40.0)

## 2021-01-29 LAB — COMPREHENSIVE METABOLIC PANEL
ALT: 4 U/L (ref 0–53)
AST: 13 U/L (ref 0–37)
Albumin: 4.2 g/dL (ref 3.5–5.2)
Alkaline Phosphatase: 47 U/L (ref 39–117)
BUN: 26 mg/dL — ABNORMAL HIGH (ref 6–23)
CO2: 31 mEq/L (ref 19–32)
Calcium: 9.1 mg/dL (ref 8.4–10.5)
Chloride: 103 mEq/L (ref 96–112)
Creatinine, Ser: 1.79 mg/dL — ABNORMAL HIGH (ref 0.40–1.50)
GFR: 34.09 mL/min — ABNORMAL LOW (ref >60.00)
Glucose, Bld: 117 mg/dL — ABNORMAL HIGH (ref 70–99)
Potassium: 4.6 mEq/L (ref 3.5–5.1)
Sodium: 144 mEq/L (ref 135–145)
Total Bilirubin: 0.4 mg/dL (ref 0.2–1.2)
Total Protein: 6.7 g/dL (ref 6.0–8.3)

## 2021-01-29 LAB — LDL CHOLESTEROL, DIRECT: Direct LDL: 104 mg/dL

## 2021-01-29 LAB — HEMOGLOBIN A1C: Hgb A1c MFr Bld: 6.6 % — ABNORMAL HIGH (ref 4.6–6.5)

## 2021-01-29 LAB — PSA: PSA: 0 ng/mL — ABNORMAL LOW (ref 0.10–4.00)

## 2021-01-29 LAB — TSH: TSH: 1.53 u[IU]/mL (ref 0.35–4.50)

## 2021-01-29 MED ORDER — FUROSEMIDE 20 MG PO TABS: ORAL_TABLET | ORAL | 1 refills | Status: DC

## 2021-01-29 MED ORDER — GABAPENTIN 300 MG PO CAPS: ORAL_CAPSULE | ORAL | 1 refills | Status: DC

## 2021-01-29 NOTE — Assessment & Plan Note (Signed)
PSA

## 2021-01-29 NOTE — Assessment & Plan Note (Signed)
On atacan

## 2021-01-29 NOTE — Assessment & Plan Note (Signed)
Use Sinemet

## 2021-01-29 NOTE — Assessment & Plan Note (Signed)
On Metformin CMET A1c

## 2021-01-29 NOTE — Assessment & Plan Note (Signed)
  Patient has mobility limitations which cannot be resolved with a cane, crutch or walker. The patient is unable to stand w/o help,unable to walk due to leg weakness and lack of balance (Parkinson's disease and spinal stenosis). He has been falling. Patient can not safely self propel the lightweight wheelchair but can safely use controls on a power wheelchair. Please provide him w/a power wheelchair ASAP.

## 2021-01-29 NOTE — Progress Notes (Signed)
Subjective:  Patient ID: Edward [name], male    DOB: 01-06-1933  Age: 87 y.o. MRN: [id number]  CC: Follow-up (4 month f/u- Flu shot & Refill on pt Furosemide & Gabapentin)   HPI Edward [name] presents for parkinson's, weakness C/o wt gain He has not been able to ambulate. He is wheelchair did pendant. He is able to feed himself. She depends on his wife's assistance for a dressing, transferring, washing, cleaning etc. he has not been able to be provided with a power wheelchair due to reasons unknown to me.  Outpatient Medications Prior to Visit  Medication Sig Dispense Refill  . aspirin EC 81 MG tablet Take 81 mg by mouth daily.    . brimonidine (ALPHAGAN) 0.2 % ophthalmic solution 1 drop 2 (two) times daily.    . candesartan (ATACAND) 32 MG tablet Take 1 tablet by mouth once daily 90 tablet 3  . carbidopa-levodopa (SINEMET IR) 25-100 MG tablet Take 2 tablets by mouth 3 (three) times daily. 9am/noon/4pm 540 tablet 1  . dorzolamide-timolol (COSOPT) 22.3-6.8 MG/ML ophthalmic solution 1 drop 2 (two) times daily.    Marland Kitchen ELIQUIS 5 MG TABS tablet Take 1 tablet by mouth twice daily 361 tablet 0  . folic acid (FOLVITE) 1 MG tablet Take 1 tablet by mouth once daily 90 tablet 0  . metFORMIN (GLUCOPHAGE) 500 MG tablet Take 1 tablet (500 mg total) by mouth 2 (two) times daily with a meal. 180 tablet 3  . Multiple Vitamins-Minerals (CENTRUM SILVER) tablet Take 1 tablet by mouth daily. Reported on 05/25/2016    . niacin (NIASPAN) 500 MG CR tablet Take 1 tablet (500 mg total) by mouth daily. 90 tablet 3  . ROCKLATAN 0.02-0.005 % SOLN Place 1 drop into both eyes at bedtime.    . vitamin B-12 (CYANOCOBALAMIN) 1000 MCG tablet Take 1,000 mcg by mouth daily.    . furosemide (LASIX) 20 MG tablet Take 20-40 mgs by mouth 3 times per week as needed for leg swelling 90 tablet 3  . gabapentin (NEURONTIN) 300 MG capsule Take 1 capsule by mouth three times daily as needed 270 capsule 1  . Vitamin D, Ergocalciferol,  (DRISDOL) 1.25 MG (50000 UT) CAPS capsule Take 1 capsule (50,000 Units total) by mouth every 30 (thirty) days. First of the month 3 capsule 3  . clonazePAM (KLONOPIN) 0.5 MG tablet Take 0.5 tablets (0.25 mg total) by mouth at bedtime. (Patient not taking: Reported on 01/29/2021) 45 tablet 1   No facility-administered medications prior to visit.    ROS: Review of Systems  Constitutional: Positive for fatigue and unexpected weight change. Negative for appetite change.  HENT: Negative for congestion, nosebleeds, sneezing, sore throat and trouble swallowing.   Eyes: Negative for itching and visual disturbance.  Respiratory: Negative for cough.   Cardiovascular: Negative for chest pain, palpitations and leg swelling.  Gastrointestinal: Negative for abdominal distention, blood in stool, diarrhea and nausea.  Genitourinary: Negative for frequency and hematuria.  Musculoskeletal: Negative for back pain, gait problem, joint swelling and neck pain.  Skin: Negative for rash.  Neurological: Positive for weakness. Negative for dizziness, tremors and speech difficulty.  Hematological: Does not bruise/bleed easily.  Psychiatric/Behavioral: Positive for confusion and decreased concentration. Negative for agitation, behavioral problems, dysphoric mood and sleep disturbance. The patient is not nervous/anxious.     Objective:  BP (!) 142/82 (BP Location: Left Arm)   Pulse 73   Temp (!) 97.4 F (36.3 C) (Oral)   SpO2 95%  BP Readings from Last 3 Encounters:  01/29/21 (!) 142/82  12/02/20 (!) 152/86  07/29/20 120/76    Wt Readings from Last 3 Encounters:  06/04/20 220 lb (99.8 kg)  06/19/19 214 lb (97.1 kg)  06/06/19 213 lb (96.6 kg)    Physical Exam Constitutional:      General: He is not in acute distress.    Appearance: He is well-developed.     Comments: NAD  HENT:     Mouth/Throat:     Mouth: Oropharynx is clear and moist.  Eyes:     Conjunctiva/sclera: Conjunctivae normal.      Pupils: Pupils are equal, round, and reactive to light.  Neck:     Thyroid: No thyromegaly.     Vascular: No JVD.  Cardiovascular:     Rate and Rhythm: Normal rate and regular rhythm.     Pulses: Intact distal pulses.     Heart sounds: Normal heart sounds. No murmur heard. No friction rub. No gallop.   Pulmonary:     Effort: Pulmonary effort is normal. No respiratory distress.     Breath sounds: Normal breath sounds. No wheezing or rales.  Chest:     Chest wall: No tenderness.  Abdominal:     General: Bowel sounds are normal. There is no distension.     Palpations: Abdomen is soft. There is no mass.     Tenderness: There is no abdominal tenderness. There is no guarding or rebound.  Musculoskeletal:        General: No tenderness or edema. Normal range of motion.     Cervical back: Normal range of motion.  Lymphadenopathy:     Cervical: No cervical adenopathy.  Skin:    General: Skin is warm and dry.     Findings: No rash.  Neurological:     Mental Status: He is alert and oriented to person, place, and time.     Cranial Nerves: No cranial nerve deficit.     Motor: Weakness present. No abnormal muscle tone.     Coordination: He displays a negative Romberg sign. Coordination abnormal.     Gait: Gait abnormal.     Deep Tendon Reflexes: Reflexes abnormal.  Psychiatric:        Mood and Affect: Mood and affect normal.        Behavior: Behavior normal.   Is in the wheelchair. He is alert and cooperative; his lower extremities are very weak; his upper extremities are weak. He is unable to stand. He is unable to push himself out of the wheelchair to stand.  Lab Results  Component Value Date   WBC 6.7 09/06/2019   HGB 14.4 09/06/2019   HCT 42.5 09/06/2019   PLT 201.0 09/06/2019   GLUCOSE 139 (H) 06/18/2020   CHOL 152 09/24/2015   TRIG 151.0 (H) 09/24/2015   HDL 34.10 (L) 09/24/2015   LDLCALC 88 09/24/2015   ALT 6 09/06/2019   AST 14 09/06/2019   NA 139 06/18/2020   K 4.7  06/18/2020   CL 102 06/18/2020   CREATININE 1.82 (H) 06/18/2020   BUN 25 (H) 06/18/2020   CO2 28 06/18/2020   TSH 1.14 09/06/2019   PSA 0.01 (L) 09/06/2019   INR 0.96 04/14/2017   HGBA1C 6.7 (H) 06/18/2020    DG HIP UNILAT WITH PELVIS 2-3 VIEWS RIGHT  Result Date: 09/07/2019 CLINICAL DATA:  Fall 2 weeks prior EXAM: DG HIP (WITH OR WITHOUT PELVIS) 2-3V RIGHT COMPARISON:  PET-CT May 16, 2014 FINDINGS: There is  no evidence of hip fracture or dislocation. Moderate degenerative changes of the right hip with subchondral sclerosis and spurring of the acetabulum. Enthesopathic changes are noted upon the right iliac crest and ischial tuberosities. Minimal degenerative changes are present in the right SI joint. Metallic radiodensities at the level of the prostate could reflect either fiducial or brachytherapy seeds, correlate with procedure history. Vascular calcifications are noted in medial right thigh. IMPRESSION: 1. No acute osseous abnormality. 2. Moderate right hip degenerative change. Electronically Signed   By: Lovena Le M.D.   On: 09/07/2019 03:52    Assessment & Plan:   Yahshua was seen today for follow-up.  Diagnoses and all orders for this visit:  Needs flu shot -     Flu Vaccine QUAD High Dose(Fluad)  Other orders -     furosemide (LASIX) 20 MG tablet; Take 20-40 mgs by mouth 3 times per week as needed for leg swelling -     gabapentin (NEURONTIN) 300 MG capsule; Take 1 capsule by mouth three times daily as needed     Meds ordered this encounter  Medications  . furosemide (LASIX) 20 MG tablet    Sig: Take 20-40 mgs by mouth 3 times per week as needed for leg swelling    Dispense:  90 tablet    Refill:  1  . gabapentin (NEURONTIN) 300 MG capsule    Sig: Take 1 capsule by mouth three times daily as needed    Dispense:  270 capsule    Refill:  1     Follow-up: No follow-ups on file.  Walker Kehr, MD

## 2021-01-30 LAB — URINALYSIS
Bilirubin Urine: NEGATIVE
Hgb urine dipstick: NEGATIVE
Leukocytes,Ua: NEGATIVE
Nitrite: NEGATIVE
Specific Gravity, Urine: 1.025 (ref 1.000–1.030)
Urine Glucose: NEGATIVE
Urobilinogen, UA: 1 (ref 0.0–1.0)
pH: 6 (ref 5.0–8.0)

## 2021-03-04 DIAGNOSIS — H40033 Anatomical narrow angle, bilateral: Secondary | ICD-10-CM | POA: Diagnosis not present

## 2021-03-04 DIAGNOSIS — H2513 Age-related nuclear cataract, bilateral: Secondary | ICD-10-CM | POA: Diagnosis not present

## 2021-03-04 DIAGNOSIS — H401133 Primary open-angle glaucoma, bilateral, severe stage: Secondary | ICD-10-CM | POA: Diagnosis not present

## 2021-03-11 ENCOUNTER — Telehealth: Payer: Self-pay | Admitting: Internal Medicine

## 2021-03-11 NOTE — Telephone Encounter (Signed)
LVM for pt to rtn my call to schedule AWV with [facility]. Please schedule appt if pt calls the office.  

## 2021-03-15 ENCOUNTER — Other Ambulatory Visit: Payer: Self-pay | Admitting: Internal Medicine

## 2021-03-26 ENCOUNTER — Ambulatory Visit: Payer: Medicare Other

## 2021-03-30 ENCOUNTER — Ambulatory Visit (INDEPENDENT_AMBULATORY_CARE_PROVIDER_SITE_OTHER): Payer: Medicare Other

## 2021-03-30 DIAGNOSIS — Z0000 Encounter for general adult medical examination without abnormal findings: Secondary | ICD-10-CM

## 2021-03-30 NOTE — Progress Notes (Addendum)
I connected with Westly Pam today by telephone and verified that I am speaking with the correct person using two identifiers. Location patient: home Location provider: work Persons participating in the virtual visit: Benancio Osmundson, Greely Atiyeh (wife) and Lisette Abu, LPN.   I discussed the limitations, risks, security and privacy concerns of performing an evaluation and management service by telephone and the availability of in person appointments. I also discussed with the patient that there may be a patient responsible charge related to this service. The patient expressed understanding and verbally consented to this telephonic visit.    Interactive audio and video telecommunications were attempted between this provider and patient, however failed, due to patient having technical difficulties OR patient did not have access to video capability.  We continued and completed visit with audio only.  Some vital signs may be absent or patient reported.   Time Spent with patient on telephone encounter: 30 minutes  Subjective:   Edward [name] is a 87 y.o. male who presents for Medicare Annual/Subsequent preventive examination.  Review of Systems    No ROS. Medicare Wellness Virtual Visit. Additional risk factors are reflected in social history. Cardiac Risk Factors include: advanced age (>27men, >34 women);diabetes mellitus;dyslipidemia;family history of premature cardiovascular disease;hypertension;male gender     Objective:    There were no vitals filed for this visit. There is no height or weight on file to calculate BMI.  Advanced Directives 03/30/2021 12/02/2020 06/04/2020 06/19/2019 04/14/2017 10/26/2016 03/11/2016  Does Patient Have a Medical Advance Directive? Yes Yes Yes Yes Yes Yes Yes  Type of Advance Directive Living will;Healthcare Power of [place];Living will [place];Living will Living will;Healthcare Power of [place] will Living will;Healthcare Power of Attorney  Does patient want to make changes to medical advance directive? No - Patient declined - - - - No - Patient declined No - Patient declined  Copy of [place] in Chart? No - copy requested - - - No - copy requested No - copy requested No - copy requested    Current Medications (verified) Outpatient Encounter Medications as of 03/30/2021  Medication Sig  . aspirin EC 81 MG tablet Take 81 mg by mouth daily.  . brimonidine (ALPHAGAN) 0.2 % ophthalmic solution 1 drop 2 (two) times daily.  . candesartan (ATACAND) 32 MG tablet Take 1 tablet by mouth once daily  . carbidopa-levodopa (SINEMET IR) 25-100 MG tablet Take 2 tablets by mouth 3 (three) times daily. 9am/noon/4pm  . dorzolamide-timolol (COSOPT) 22.3-6.8 MG/ML ophthalmic solution 1 drop 2 (two) times daily.  Marland Kitchen ELIQUIS 5 MG TABS tablet Take 1 tablet by mouth twice daily  . folic acid (FOLVITE) 1 MG tablet Take 1 tablet by mouth once daily  . furosemide (LASIX) 20 MG tablet Take 20-40 mgs by mouth 3 times per week as needed for leg swelling  . gabapentin (NEURONTIN) 300 MG capsule Take 1 capsule by mouth three times daily as needed  . metFORMIN (GLUCOPHAGE) 500 MG tablet Take 1 tablet (500 mg total) by mouth 2 (two) times daily with a meal.  . Multiple Vitamins-Minerals (CENTRUM SILVER) tablet Take 1 tablet by mouth daily. Reported on 05/25/2016  . niacin (NIASPAN) 500 MG CR tablet Take 1 tablet (500 mg total) by mouth daily.  Marland Kitchen ROCKLATAN 0.02-0.005 % SOLN Place 1 drop into both eyes at bedtime.  . vitamin B-12 (CYANOCOBALAMIN) 1000 MCG tablet Take 1,000 mcg by mouth daily.  No facility-administered encounter medications on file as of 03/30/2021.    Allergies (verified) Naproxen sodium   History: Past Medical History:  Diagnosis Date  . Back pain    Difficulty walking- left leg pain  . Chronic renal insufficiency    DR WEBB  . Complex  renal cyst RIGHT--  MONITORED BY DR Risa Grill  . Crush injury, back   . Drug-induced low platelet count    from NSAIDS  . DVT (deep venous thrombosis) ([place])   . Elevated glucose   . Hearing loss   . History of swelling of feet   . Hyperlipidemia   . Hypertension   . Nocturia   . Prostate cancer ([place])   . Right knee injury    In past  . S/P radiation therapy 06/25/2015 through 08/19/2015   Prostate 7800 cGy in 40 sessions, seminal vesicles 5600 cGy in 40 sessions   . Venous insufficiency of leg   . Wears dentures    partial  . Wears glasses    Past Surgical History:  Procedure Laterality Date  . COLONOSCOPY  2/77/11  . CRYOABLATION  12/01/2012   Procedure: CRYO ABLATION PROSTATE;  Surgeon: Hanley Ben, MD;  Location: [facility];  Service: Urology;  Laterality: N/A;  . LUMBAR LAMINECTOMY/DECOMPRESSION MICRODISCECTOMY Left 10/28/2016   Procedure: Lumbar Laminectomy/decompression Microdiscectomy lumbar three- lumbar four, lumbar four to lumbar five, Left;  Surgeon: Kary Kos, MD;  Location: [facility];  Service: Neurosurgery;  Laterality: Left;  Lumbar Laminectomy/decompression Microdiscectomy lumbar three- lumbar four, lumbar four to lumbar five, Left  . QUADRICEPS TENDON REPAIR Left 10/31/2013   Procedure: Left Quadriceps Tendon Repair;  Surgeon: Marybelle Killings, MD;  Location: [place];  Service: Orthopedics;  Laterality: Left;  Left Quadriceps Tendon Repair  . REPAIR KNEE LIGAMENT Left 2014   Family History  Problem Relation Age of Onset  . Dementia Mother   . Nephrolithiasis Mother   . Tuberculosis Father   . Hypertension Father   . Nephrolithiasis Brother   . Prostate cancer Brother   . CVA Sister    Social History   Socioeconomic History  . Marital status: Married    Spouse name: Not on file  . Number of children: 2  . Years of education: Not on file  . Highest education level: Not on file  Occupational History  . Occupation: retired     Comment: Office manager  Tobacco Use  . Smoking status: Never Smoker  . Smokeless tobacco: Never Used  Vaping Use  . Vaping Use: Never used  Substance and Sexual Activity  . Alcohol use: No  . Drug use: No  . Sexual activity: Yes  Other Topics Concern  . Not on file  Social History Narrative  . Not on file   Social Determinants of Health   Financial Resource Strain: Low Risk   . Difficulty of Paying Living Expenses: Not hard at all  Food Insecurity: No Food Insecurity  . Worried About Charity fundraiser in the Last Year: Never true  . Ran Out of Food in the Last Year: Never true  Transportation Needs: No Transportation Needs  . Lack of Transportation (Medical): No  . Lack of Transportation (Non-Medical): No  Physical Activity: Insufficiently Active  . Days of Exercise per Week: 2 days  . Minutes of Exercise per Session: 30 min  Stress: No Stress Concern Present  . Feeling of Stress : Not at all  Social Connections: Socially Integrated  . Frequency of Communication  with Friends and Family: More than three times a week  . Frequency of Social Gatherings with Friends and Family: Twice a week  . Attends Religious Services: 1 to 4 times per year  . Active Member of Clubs or Organizations: No  . Attends Archivist Meetings: 1 to 4 times per year  . Marital Status: Married    Tobacco Counseling Counseling given: Not Answered   Clinical Intake:  Pre-visit preparation completed: Yes  Pain : No/denies pain     Nutritional Risks: None Diabetes: Yes CBG done?: No Did pt. bring in CBG monitor from home?: No  How often do you need to have someone help you when you read instructions, pamphlets, or other written materials from your doctor or pharmacy?: 1 - Never What is the last grade level you completed in school?: High School Graduate  Diabetic? yes  Interpreter Needed?: No  Information entered by :: Lisette Abu, LPN   Activities of Daily  Living In your present state of health, do you have any difficulty performing the following activities: 03/30/2021  Hearing? N  Vision? Y  Comment History of Glaucoma  Difficulty concentrating or making decisions? N  Walking or climbing stairs? Y  Dressing or bathing? Y  Doing errands, shopping? Y  Preparing Food and eating ? Y  Using the Toilet? N  In the past six months, have you accidently leaked urine? N  Comment Uses a urinal  Do you have problems with loss of bowel control? N  Managing your Medications? Y  Managing your Finances? Y  Housekeeping or managing your Housekeeping? Y  Some recent data might be hidden    Patient Care Team: Plotnikov, Evie Lacks, MD as PCP - General Edrick Oh, MD (Nephrology) Gatha Mayer, MD (Gastroenterology) Tat, Eustace Quail, DO as Consulting Physician (Neurology) Ceasar Mons, MD as Consulting Physician (Urology)  Indicate any recent Medical Services you may have received from other than [facility] providers in the past year (date may be approximate).     Assessment:   This is a routine wellness examination for [place].  Hearing/Vision screen No exam data present  Dietary issues and exercise activities discussed: Current Exercise Habits: Home exercise routine, Type of exercise: Other - see comments (General exercises; nothing strenous), Time (Minutes): 25, Frequency (Times/Week): 2, Weekly Exercise (Minutes/Week): 50, Intensity: Mild, Exercise limited by: neurologic condition(s);orthopedic condition(s)  Goals   None    Depression Screen PHQ 2/9 Scores 03/30/2021 01/29/2021 06/04/2020 10/25/2019 04/24/2018 02/23/2017 10/25/2016  PHQ - 2 Score 0 0 0 0 0 0 0    Fall Risk Fall Risk  03/30/2021 01/29/2021 12/02/2020 06/04/2020 06/19/2019  Falls in the past year? 1 1 0 1 0  Number falls in past yr: 1 1 0 1 0  Injury with Fall? 1 1 0 0 0  Risk Factor Category  - - - - -  Risk for fall due to : Impaired mobility Impaired mobility - - -   Follow up Falls evaluation completed - - - -    FALL RISK PREVENTION PERTAINING TO THE HOME:  Any stairs in or around the home? Yes  If so, are there any without handrails? No  Home free of loose throw rugs in walkways, pet beds, electrical cords, etc? Yes  Adequate lighting in your home to reduce risk of falls? Yes   ASSISTIVE DEVICES UTILIZED TO PREVENT FALLS:  Life alert? Yes  Use of a cane, walker or w/c? Yes  Grab bars in the  bathroom? Yes  Shower chair or bench in shower? Yes  Elevated toilet seat or a handicapped toilet? Yes   TIMED UP AND GO:  Was the test performed? No .  Length of time to ambulate 10 feet: 0 sec.   Gait unsteady without use of assistive device, provider informed and interventions were implemented (patient uses a wheelchair due to Parkinson's Disease)  Cognitive Function: MMSE - Mini Mental State Exam 09/12/2018  Not completed: Unable to complete        Immunizations Immunization History  Administered Date(s) Administered  . Fluad Quad(high Dose 65+) 09/06/2019, 01/29/2021  . Influenza, High Dose Seasonal PF 10/24/2017, 10/11/2018  . PFIZER(Purple Top)SARS-COV-2 Vaccination 02/10/2020, 03/05/2020    TDAP status: Due, Education has been provided regarding the importance of this vaccine. Advised may receive this vaccine at local pharmacy or Health Dept. Aware to provide a copy of the vaccination record if obtained from local pharmacy or Health Dept. Verbalized acceptance and understanding.  Flu Vaccine status: Up to date  Pneumococcal vaccine status: Declined,  Education has been provided regarding the importance of this vaccine but patient still declined. Advised may receive this vaccine at local pharmacy or Health Dept. Aware to provide a copy of the vaccination record if obtained from local pharmacy or Health Dept. Verbalized acceptance and understanding.   Covid-19 vaccine status: Completed vaccines  Qualifies for Shingles Vaccine? Yes    Zostavax completed No   Shingrix Completed?: No.    Education has been provided regarding the importance of this vaccine. Patient has been advised to call insurance company to determine out of pocket expense if they have not yet received this vaccine. Advised may also receive vaccine at local pharmacy or Health Dept. Verbalized acceptance and understanding.  Screening Tests Health Maintenance  Topic Date Due  . FOOT EXAM  Never done  . OPHTHALMOLOGY EXAM  Never done  . TETANUS/TDAP  Never done  . PNA vac Low Risk Adult (1 of 2 - PCV13) Never done  . COVID-19 Vaccine (3 - Pfizer risk 4-dose series) 04/02/2020  . INFLUENZA VACCINE  07/20/2021  . HPV VACCINES  Aged Out    Health Maintenance  Health Maintenance Due  Topic Date Due  . FOOT EXAM  Never done  . OPHTHALMOLOGY EXAM  Never done  . TETANUS/TDAP  Never done  . PNA vac Low Risk Adult (1 of 2 - PCV13) Never done  . COVID-19 Vaccine (3 - Pfizer risk 4-dose series) 04/02/2020    Colorectal cancer screening: No longer required.   Lung Cancer Screening: (Low Dose CT Chest recommended if Age 46-80 years, 30 pack-year currently smoking OR have quit w/in 15years.) does not qualify.   Lung Cancer Screening Referral: no  Additional Screening:  Hepatitis C Screening: does not qualify; Completed no  Vision Screening: Recommended annual ophthalmology exams for early detection of glaucoma and other disorders of the eye. Is the patient up to date with their annual eye exam?  Yes  Who is the provider or what is the name of the office in which the patient attends annual eye exams? Warden Fillers, MD. If pt is not established with a provider, would they like to be referred to a provider to establish care? No .   Dental Screening: Recommended annual dental exams for proper oral hygiene  Community Resource Referral / Chronic Care Management: CRR required this visit?  No   CCM required this visit?  No      Plan:  I have  personally reviewed and noted the following in the patient's chart:   . Medical and social history . Use of alcohol, tobacco or illicit drugs  . Current medications and supplements . Functional ability and status . Nutritional status . Physical activity . Advanced directives . List of other physicians . Hospitalizations, surgeries, and ER visits in previous 12 months . Vitals . Screenings to include cognitive, depression, and falls . Referrals and appointments  In addition, I have reviewed and discussed with patient certain preventive protocols, quality metrics, and best practice recommendations. A written personalized care plan for preventive services as well as general preventive health recommendations were provided to patient.     Sheral Flow, LPN   6/68/1594   Nurse Notes:  Patient is cogitatively intact. (Help from wife during interview process) There were no vitals filed for this visit. There is no height or weight on file to calculate BMI. Patient stated that she has no issues with gait or balance; does not use any assistive devices. Medications reviewed with patient; no opioid use noted.  Medical screening examination/treatment/procedure(s) were performed by non-physician practitioner and as supervising physician I was immediately available for consultation/collaboration. I agree with above. Lew Dawes, MD

## 2021-03-30 NOTE — Patient Instructions (Signed)
[name] , Thank you for taking time to come for your Medicare Wellness Visit. I appreciate your ongoing commitment to your health goals. Please review the following plan we discussed and let me know if I can assist you in the future.   Screening recommendations/referrals: Colonoscopy: Last done 01/26/2010; no repeat due to age Recommended yearly ophthalmology/optometry visit for glaucoma screening and checkup Recommended yearly dental visit for hygiene and checkup  Vaccinations: Influenza vaccine: 01/29/2021 Pneumococcal vaccine: declined Tdap vaccine: declined Shingles vaccine: declined   Covid-19: 02/10/2020, 03/06/2019  Advanced directives: Please bring a copy of your health care power of attorney and living will to the office at your convenience.  Conditions/risks identified: Yes. Reviewed health maintenance screenings with patient today and relevant education, vaccines, and/or referrals were provided. Continue doing brain stimulating activities (puzzles, reading, adult coloring books, staying active) to keep memory sharp. Continue to eat heart healthy diet (full of fruits, vegetables, whole grains, lean protein, water--limit salt, fat, and sugar intake) and increase physical activity as tolerated.  Next appointment: Please schedule your next Medicare Wellness Visit with your Nurse Health Advisor in 1 year by calling [phone].  Preventive Care 45 Years and Older, Male Preventive care refers to lifestyle choices and visits with your health care provider that can promote health and wellness. What does preventive care include?  A yearly physical exam. This is also called an annual well check.  Dental exams once or twice a year.  Routine eye exams. Ask your health care provider how often you should have your eyes checked.  Personal lifestyle choices, including:  Daily care of your teeth and gums.  Regular physical activity.  Eating a healthy diet.  Avoiding tobacco and drug  use.  Limiting alcohol use.  Practicing safe sex.  Taking low doses of aspirin every day.  Taking vitamin and mineral supplements as recommended by your health care provider. What happens during an annual well check? The services and screenings done by your health care provider during your annual well check will depend on your age, overall health, lifestyle risk factors, and family history of disease. Counseling  Your health care provider may ask you questions about your:  Alcohol use.  Tobacco use.  Drug use.  Emotional well-being.  Home and relationship well-being.  Sexual activity.  Eating habits.  History of falls.  Memory and ability to understand (cognition).  Work and work Statistician. Screening  You may have the following tests or measurements:  Height, weight, and BMI.  Blood pressure.  Lipid and cholesterol levels. These may be checked every 5 years, or more frequently if you are over 72 years old.  Skin check.  Lung cancer screening. You may have this screening every year starting at age 43 if you have a 30-pack-year history of smoking and currently smoke or have quit within the past 15 years.  Fecal occult blood test (FOBT) of the stool. You may have this test every year starting at age 51.  Flexible sigmoidoscopy or colonoscopy. You may have a sigmoidoscopy every 5 years or a colonoscopy every 10 years starting at age 60.  Prostate cancer screening. Recommendations will vary depending on your family history and other risks.  Hepatitis C blood test.  Hepatitis B blood test.  Sexually transmitted disease (STD) testing.  Diabetes screening. This is done by checking your blood sugar (glucose) after you have not eaten for a while (fasting). You may have this done every 1-3 years.  Abdominal aortic aneurysm (AAA) screening. You  may need this if you are a current or former smoker.  Osteoporosis. You may be screened starting at age 8 if you are at  high risk. Talk with your health care provider about your test results, treatment options, and if necessary, the need for more tests. Vaccines  Your health care provider may recommend certain vaccines, such as:  Influenza vaccine. This is recommended every year.  Tetanus, diphtheria, and acellular pertussis (Tdap, Td) vaccine. You may need a Td booster every 10 years.  Zoster vaccine. You may need this after age 48.  Pneumococcal 13-valent conjugate (PCV13) vaccine. One dose is recommended after age 49.  Pneumococcal polysaccharide (PPSV23) vaccine. One dose is recommended after age 57. Talk to your health care provider about which screenings and vaccines you need and how often you need them. This information is not intended to replace advice given to you by your health care provider. Make sure you discuss any questions you have with your health care provider. Document Released: 01/02/2016 Document Revised: 08/25/2016 Document Reviewed: 10/07/2015 Elsevier Interactive Patient Education  2017 [place] Prevention in the Home Falls can cause injuries. They can happen to people of all ages. There are many things you can do to make your home safe and to help prevent falls. What can I do on the outside of my home?  Regularly fix the edges of walkways and driveways and fix any cracks.  Remove anything that might make you trip as you walk through a door, such as a raised step or threshold.  Trim any bushes or trees on the path to your home.  Use bright outdoor lighting.  Clear any walking paths of anything that might make someone trip, such as rocks or tools.  Regularly check to see if handrails are loose or broken. Make sure that both sides of any steps have handrails.  Any raised decks and porches should have guardrails on the edges.  Have any leaves, snow, or ice cleared regularly.  Use sand or salt on walking paths during winter.  Clean up any spills in your garage  right away. This includes oil or grease spills. What can I do in the bathroom?  Use night lights.  Install grab bars by the toilet and in the tub and shower. Do not use towel bars as grab bars.  Use non-skid mats or decals in the tub or shower.  If you need to sit down in the shower, use a plastic, non-slip stool.  Keep the floor dry. Clean up any water that spills on the floor as soon as it happens.  Remove soap buildup in the tub or shower regularly.  Attach bath mats securely with double-sided non-slip rug tape.  Do not have throw rugs and other things on the floor that can make you trip. What can I do in the bedroom?  Use night lights.  Make sure that you have a light by your bed that is easy to reach.  Do not use any sheets or blankets that are too big for your bed. They should not hang down onto the floor.  Have a firm chair that has side arms. You can use this for support while you get dressed.  Do not have throw rugs and other things on the floor that can make you trip. What can I do in the kitchen?  Clean up any spills right away.  Avoid walking on wet floors.  Keep items that you use a lot in easy-to-reach places.  If you need to reach something above you, use a strong step stool that has a grab bar.  Keep electrical cords out of the way.  Do not use floor polish or wax that makes floors slippery. If you must use wax, use non-skid floor wax.  Do not have throw rugs and other things on the floor that can make you trip. What can I do with my stairs?  Do not leave any items on the stairs.  Make sure that there are handrails on both sides of the stairs and use them. Fix handrails that are broken or loose. Make sure that handrails are as long as the stairways.  Check any carpeting to make sure that it is firmly attached to the stairs. Fix any carpet that is loose or worn.  Avoid having throw rugs at the top or bottom of the stairs. If you do have throw rugs,  attach them to the floor with carpet tape.  Make sure that you have a light switch at the top of the stairs and the bottom of the stairs. If you do not have them, ask someone to add them for you. What else can I do to help prevent falls?  Wear shoes that:  Do not have high heels.  Have rubber bottoms.  Are comfortable and fit you well.  Are closed at the toe. Do not wear sandals.  If you use a stepladder:  Make sure that it is fully opened. Do not climb a closed stepladder.  Make sure that both sides of the stepladder are locked into place.  Ask someone to hold it for you, if possible.  Clearly mark and make sure that you can see:  Any grab bars or handrails.  First and last steps.  Where the edge of each step is.  Use tools that help you move around (mobility aids) if they are needed. These include:  Canes.  Walkers.  Scooters.  Crutches.  Turn on the lights when you go into a dark area. Replace any light bulbs as soon as they burn out.  Set up your furniture so you have a clear path. Avoid moving your furniture around.  If any of your floors are uneven, fix them.  If there are any pets around you, be aware of where they are.  Review your medicines with your doctor. Some medicines can make you feel dizzy. This can increase your chance of falling. Ask your doctor what other things that you can do to help prevent falls. This information is not intended to replace advice given to you by your health care provider. Make sure you discuss any questions you have with your health care provider. Document Released: 10/02/2009 Document Revised: 05/13/2016 Document Reviewed: 01/10/2015 Elsevier Interactive Patient Education  2017 [employer].

## 2021-05-30 ENCOUNTER — Other Ambulatory Visit: Payer: Self-pay | Admitting: Neurology

## 2021-06-01 NOTE — Telephone Encounter (Signed)
Enough given only till his appt 06/09/2021 with Dr.Rebecca Tat

## 2021-06-05 NOTE — Progress Notes (Addendum)
Assessment/Plan:   1. Probable PSP  -looks more PSP like today  -not sure that medication helping  -For now we will continue carbidopa/levodopa 25/100, 2/2/2 (take at 9/noon/4pm)  -examination complicated by tendon injury years ago to L leg/knee  -DaT positive  -has lift chair at home.  -I am going to try to pursue power WC.  Patient reports that his insurance denied this, but I was not previously involved with that.  Tilt/recline would be beneficial.  Will refer to PT for mobility/seating eval  -We will refer him to [facility] for any respite care help but they can provide.  -Met with LCSW today.  Discussed palliative care.  2.  RBD  -Did not tolerate clonazepam.   Subjective:   Edward [name] was seen today in follow up for parkinsonism, with positive DaTscan.  Pt with wife who supplements history.  Patient currently on carbidopa/levodopa 25/100, 2 tablets 3 times per day.  Dr. Posey Rea had been working on trying to get the patient a power wheelchair.  They state that [facility] denied it (I don't see that a WC eval was done).  Pt states that he cannot walk at all.  He cannot push the [facility] and his wife (she is only 110lbs) has to push it to the BR and have to try and get him out of the chair and pull him onto the toilet.  This is a slow process and results in accidents.  Pt not able to get around with walker at all - multiple falls with it in past.  Has no diplopia.  He has phlegm in the throat.  Denies choking with food but wife states that he does sometimes.  We trialed him on klonopin and he was too weak in the middle of the night and almost hung over.  Trouble getting in and out of the bed even in adjustable bed.  Unable to get in shower at all.  Wife doing sponge baths.    Pt has the mental capability to use the power WC and has demonstrated that with the PT with [employer] per pt/wife.  he cannot use manual WC because of endurance abilities to propel and poor coordination of UE's.   he  could not mount a scooter/transfer onto safely, hence making it inappropriate for this patient.   Rollator not appropriate as multiple falls with this and traditional walker.    Current prescribed movement disorder medications: Carbidopa/levodopa 25/100, 2/2/2 Clonazepam 0.5 mg, half tablet at bedtime (started last visit)  Prior meds:  klonopin - weak and hung over even at 0.25 mg dose   ALLERGIES:   Allergies  Allergen Reactions   Naproxen Sodium Other (See Comments)    low platelets    CURRENT MEDICATIONS:  Outpatient Encounter Medications as of 06/09/2021  Medication Sig   aspirin EC 81 MG tablet Take 81 mg by mouth daily.   brimonidine (ALPHAGAN) 0.2 % ophthalmic solution 1 drop 2 (two) times daily.   candesartan (ATACAND) 32 MG tablet Take 1 tablet by mouth once daily   carbidopa-levodopa (SINEMET IR) 25-100 MG tablet TAKE 2 TABLETS BY MOUTH THREE TIMES DAILY (9AM,  NOON,  4PM)   dorzolamide-timolol (COSOPT) 22.3-6.8 MG/ML ophthalmic solution 1 drop 2 (two) times daily.   ELIQUIS 5 MG TABS tablet Take 1 tablet by mouth twice daily   folic acid (FOLVITE) 1 MG tablet Take 1 tablet by mouth once daily   furosemide (LASIX) 20 MG tablet Take 20-40 mgs by mouth  3 times per week as needed for leg swelling   gabapentin (NEURONTIN) 300 MG capsule Take 1 capsule by mouth three times daily as needed   metFORMIN (GLUCOPHAGE) 500 MG tablet Take 1 tablet (500 mg total) by mouth 2 (two) times daily with a meal.   Multiple Vitamins-Minerals (CENTRUM SILVER) tablet Take 1 tablet by mouth daily. Reported on 05/25/2016   niacin (NIASPAN) 500 MG CR tablet Take 1 tablet (500 mg total) by mouth daily.   ROCKLATAN 0.02-0.005 % SOLN Place 1 drop into both eyes at bedtime.   vitamin B-12 (CYANOCOBALAMIN) 1000 MCG tablet Take 1,000 mcg by mouth daily.   No facility-administered encounter medications on file as of 06/09/2021.    Objective:   PHYSICAL EXAMINATION:    VITALS:   Vitals:   06/09/21  1009  BP: 134/78  Pulse: 76  SpO2: 95%  Weight: 245 lb (111.1 kg)  Height: $Remove'6\' 1"'EUvnwCU$  (1.854 m)     GEN:  The patient appears stated age and is in NAD. HEENT:  Normocephalic, atraumatic.  The mucous membranes are moist. The superficial temporal arteries are without ropiness or tenderness.   Neurological examination:  Orientation: The patient is alert and oriented x3. Cranial nerves: There is good facial symmetry with facial hypomimia. Eomi with exception of downgaze paresis.  The speech is fluent and clear with some pseudobulbar speech and trouble with gutteral sounds. Soft palate rises symmetrically and there is no tongue deviation. Hearing is intact to conversational tone. Sensation: Sensation is intact to light touch throughout Motor: Strength is at least antigravity x4.  Movement examination: Tone: There is nl tone in the UE/LE Abnormal movements: none Coordination:  There is no significant decremation with RAM's,  Gait and Station: not tested today.  unable  I have reviewed and interpreted the following labs independently    Chemistry      Component Value Date/Time   NA 144 01/29/2021 1608   K 4.6 01/29/2021 1608   CL 103 01/29/2021 1608   CO2 31 01/29/2021 1608   BUN 26 (H) 01/29/2021 1608   CREATININE 1.79 (H) 01/29/2021 1608      Component Value Date/Time   CALCIUM 9.1 01/29/2021 1608   ALKPHOS 47 01/29/2021 1608   AST 13 01/29/2021 1608   ALT 4 01/29/2021 1608   BILITOT 0.4 01/29/2021 1608       Lab Results  Component Value Date   WBC 5.6 01/29/2021   HGB 14.1 01/29/2021   HCT 41.2 01/29/2021   MCV 93.1 01/29/2021   PLT 196.0 01/29/2021    Lab Results  Component Value Date   TSH 1.53 01/29/2021     Total time spent on today's visit was 40 minutes, including both face-to-face time and nonface-to-face time.  Time included that spent on review of records (prior notes available to me/labs/imaging if pertinent), discussing treatment and goals, answering  patient's questions and coordinating care.  Cc:  Plotnikov, Evie Lacks, MD

## 2021-06-09 ENCOUNTER — Other Ambulatory Visit: Payer: Self-pay

## 2021-06-09 ENCOUNTER — Ambulatory Visit (INDEPENDENT_AMBULATORY_CARE_PROVIDER_SITE_OTHER): Payer: Medicare Other | Admitting: Neurology

## 2021-06-09 ENCOUNTER — Telehealth: Payer: Self-pay

## 2021-06-09 ENCOUNTER — Encounter: Payer: Self-pay | Admitting: Neurology

## 2021-06-09 VITALS — BP 134/78 | HR 76 | Ht 73.0 in | Wt 245.0 lb

## 2021-06-09 DIAGNOSIS — G231 Progressive supranuclear ophthalmoplegia [Steele-Richardson-Olszewski]: Secondary | ICD-10-CM | POA: Diagnosis not present

## 2021-06-09 MED ORDER — CARBIDOPA-LEVODOPA 25-100 MG PO TABS: ORAL_TABLET | ORAL | 1 refills | Status: DC

## 2021-06-09 NOTE — Telephone Encounter (Signed)
Patients wife came back to office to request Carbidopa/Levodpa refill.  Per Dr. Carles Collet ok to send 90 days with 1 refill until f/u 12/22/21

## 2021-06-09 NOTE — Patient Instructions (Signed)
I think you would benefit from talking with social worker at the [facility].  Her name is Biomedical engineer.  Her contact information is [phone] or you can call the foundation at [phone].    I will contact Andria Rhein and see if we can get anywhere with the power wheelchair

## 2021-06-10 ENCOUNTER — Telehealth: Payer: Self-pay | Admitting: Internal Medicine

## 2021-06-10 NOTE — Telephone Encounter (Signed)
   Patients wife called and is requesting a call back in regards to cancelling an order for a wheelchair. She can be reached at [phone]

## 2021-07-01 DIAGNOSIS — H401133 Primary open-angle glaucoma, bilateral, severe stage: Secondary | ICD-10-CM | POA: Diagnosis not present

## 2021-07-01 DIAGNOSIS — H40033 Anatomical narrow angle, bilateral: Secondary | ICD-10-CM | POA: Diagnosis not present

## 2021-07-01 DIAGNOSIS — H2513 Age-related nuclear cataract, bilateral: Secondary | ICD-10-CM | POA: Diagnosis not present

## 2021-08-11 ENCOUNTER — Telehealth: Payer: Self-pay | Admitting: Neurology

## 2021-08-11 NOTE — Telephone Encounter (Signed)
Per debbie roach:  We were able to finally get patient scheduled for the PT eval for power wheelchair on 09/15/21 but patient's wife requested we cancel the order as she is trying to get patient into a SNF. We will also pick up the loaner power wheelchair we provided to patient. Please let me know if anything changes and we will reopen the order to pursue power wheelchair.   Please call patient/wife and let them know I don't think its a good decision.  Hes waited a year to get this Power WC.  Are they sure they want to cancel this and start over?

## 2021-08-14 NOTE — Telephone Encounter (Signed)
community message was sent to [employer]

## 2021-08-14 NOTE — Telephone Encounter (Signed)
She wants to decline the power chair because of his decline in health. Then stated that if they could do the evaluation for PT at home they would do it but if he has to go to [place] to the evaluation they can not do it. She stated that he has falling 6 times. And in the power chair he has ran into the walls, bed and cabinets because of his decline

## 2021-08-19 ENCOUNTER — Telehealth: Payer: Self-pay | Admitting: Neurology

## 2021-08-19 NOTE — Telephone Encounter (Signed)
Pt wife called and was wondering if Tat would allow her to have a phone visit. She said [place] has went down hill very fast and she needs some advise on what to do. She is trying to get them in an assisted living home to have help. He is falling a lot, and the reason she is asking for a phone call is b/c she cant get him here. His next appt isnt until 12/22/21.

## 2021-08-20 NOTE — Telephone Encounter (Signed)
Called patients wife back to gather more information on how he is declining and what help she feels like she is needing.  .phone

## 2021-08-20 NOTE — Telephone Encounter (Signed)
Patients wife said patient has fallen 7 times she feels as though he is declining quickly he is weak and can not stand on his own , he is unable to remember anything he is able to brush his teeth and that is about all she does want to get him into a skilled nursing facility as she is unable to care for him and is having trouble caring for herself as well. Looking for advice on what to do next

## 2021-08-21 NOTE — Telephone Encounter (Signed)
I agree with skilled facility.  He shouldn't be walking at all.  We discussed this at prior visit.  Misty, can you assist?

## 2021-08-21 NOTE — Telephone Encounter (Signed)
LCSW talked to Mrs. Yam about Khyir her husband and recent falls and followed up with the question about going to a SNF.   Mrs. Noto reports that she is concerned and wanted Dr. Carles Collet to know about drastic decline and for her to be informed of the falls and if she felt he needed nursing placement .  LCSW reviewed message from Dr. Carles Collet that she is in agreement of skilled care for patient.    Mrs. Rijo does have an CNA that comes to the home on Tuesday and Friday and that will increase.  Next week she is going to two assisted living facilities for her and Gevorg to live at.  LCSW did talk about him possible needing skill level of care but they would assess him at the assisted living facility.  Her first place that she is going to is [facility] .  LCSW requested for Mrs. Kellar to keep SW informed and would be available to assist if any questions.

## 2021-09-10 ENCOUNTER — Telehealth: Payer: Self-pay | Admitting: Internal Medicine

## 2021-09-10 ENCOUNTER — Other Ambulatory Visit: Payer: Self-pay | Admitting: Internal Medicine

## 2021-09-10 ENCOUNTER — Telehealth: Payer: Self-pay | Admitting: Neurology

## 2021-09-10 NOTE — Telephone Encounter (Signed)
Patient's wife has been meeting with an age advocate, Billy Fischer. She will help get the patient placed in assisted living.  They signed a form to help facilitate placement as his wife can no longer take care of him by herself.  She called to let Dr. Carles Collet know to expect the form.

## 2021-09-10 NOTE — Telephone Encounter (Signed)
Patient spouse called in  Spouse no longer able to take care of patient..in the works to place him in a assisted living facility..wants to discuss w/ provider  Please call [phone]

## 2021-09-10 NOTE — Telephone Encounter (Signed)
We can fill out FL 2 form.  How do we connect Edward [name] to a Education officer, museum?  Thanks

## 2021-09-10 NOTE — Telephone Encounter (Signed)
MD is out of the office until next Tuesday 09/15/21 will hold until he returns.Marland KitchenJohny Chess

## 2021-09-11 NOTE — Telephone Encounter (Signed)
Called patients wife Stanton Kidney back and let her know that the PCP needs to fill out this form. She said she has sent one over there as well. No other questions at this time

## 2021-09-15 NOTE — Telephone Encounter (Signed)
Called pt wife there was no answer LMOM RTC.Marland KitchenJohny Chess

## 2021-09-15 NOTE — Telephone Encounter (Signed)
Wife return call back gave her MD response. Wife states she  have someone by the name of Billy Fischer w/ [facility]. She states she will be finding them both a place to go since she has injured her back taking care of Mr. Eltringham. Wife states she will probably be calling Dr. Camila Li, but in te meantime she will go ahead and get the FL2. Inform wife once the FL2 is complete I will give her call to pick up. Place FL2 on MD to complete.Marland KitchenJohny Chess

## 2021-09-16 NOTE — Telephone Encounter (Signed)
Completed FL2 place on MD desk to sign.Marland KitchenJohny Chess

## 2021-09-17 NOTE — Telephone Encounter (Signed)
Noted. Thanks.

## 2021-09-18 NOTE — Telephone Encounter (Signed)
Notified pt FL2 form is ready for pick-up.Marland KitchenJohny [name]

## 2021-09-24 ENCOUNTER — Telehealth: Payer: Self-pay | Admitting: Internal Medicine

## 2021-09-24 NOTE — Telephone Encounter (Signed)
Patient spouse says she received FL2 forms for patient but home health nurse says there are some changes that need to be made on form  Requesting a call back [phone]

## 2021-09-25 ENCOUNTER — Other Ambulatory Visit: Payer: Self-pay | Admitting: Internal Medicine

## 2021-09-25 NOTE — Telephone Encounter (Signed)
Called wife back states the nurse went over the [facility] form and some of the things was list incorrectly. She also states that the pt is non-ambulatory he can not walk,or do anything by himself. Also need #16 for diet it has to say chopped, #17 need to add finger stick once a week. Inform pt will re-due FL2 and have MD to sign on Monday. She is requesting that we call Billy Fischer @ [phone] to pick-up when ready.Completed and place on MD to sign...Johny Chess

## 2021-09-27 NOTE — Telephone Encounter (Signed)
Please correct.  Thanks

## 2021-09-29 NOTE — Telephone Encounter (Signed)
FL2 was corrected. MD signed notified Anderson Malta form ready for pick-up.Marland KitchenJohny Chess

## 2021-10-06 ENCOUNTER — Other Ambulatory Visit: Payer: Self-pay | Admitting: Internal Medicine

## 2021-10-09 ENCOUNTER — Telehealth: Payer: Self-pay | Admitting: Internal Medicine

## 2021-10-09 NOTE — Telephone Encounter (Signed)
Called Anderson Malta to get clarification on the msg below. There was no answer LMOM RTC.Marland KitchenJohny Chess

## 2021-10-09 NOTE — Telephone Encounter (Signed)
Home Health verbal orders-caller/Agency: Anderson Malta Harris/ Age Advocate  Callback number: [phone]  Requesting OT/PT/Skilled nursing/Social Work/Speech: SNF  Frequency: long term care placement  [facility] (geriatric care manager) requesting patients' glaucoma diagnosis to be listed

## 2021-10-09 NOTE — Telephone Encounter (Signed)
Edward [name] called back  Stated the patient needs long-term care so the form needs to skilled nursing. The FL-2 form that was completed was for assisted living which will not cover the patient's needs. Requesting that to be updated. Also the medication list has eye drops listed for glaucoma but has no record of diagnosis with it. Requesting the diagnosis be added as well.

## 2021-10-12 NOTE — Telephone Encounter (Signed)
Rec'd call from [place] stating the pt is not eligible for SNF. They are looking for a long term facility because pt is unable to do anything for him self. Also need to add " Glaucoma" to diagnosis list. Inform Anderson Malta will compete FL2 form and once its sign will call her for pick/up.Place on MD desk to sign...Johny Chess

## 2021-10-13 ENCOUNTER — Telehealth: Payer: Self-pay | Admitting: Internal Medicine

## 2021-10-13 NOTE — Telephone Encounter (Signed)
Edward [name] has come into the office, and is requesting a copy of [place] & last two visits notes for pt.      Call when completed @ [phone] & Anderson Malta will pick it up. Would like paperwork printed as soon as possible for placement

## 2021-10-13 NOTE — Telephone Encounter (Signed)
Okay.  Thanks.

## 2021-10-13 NOTE — Telephone Encounter (Signed)
Called Anderson Malta there was no answer LMOM FL2 form ready for pick-up.Marland KitchenJohny Chess

## 2021-10-13 NOTE — Telephone Encounter (Signed)
Billy Fischer calling in again  Says she needs the [facility] Medicaid Long Term Care FL2 form to be completed for patient  Says it can be found on [email or web address] website  Please call when completed [phone]

## 2021-10-14 NOTE — Telephone Encounter (Signed)
Notified Mrs. Harris inform her office normally don't keep the FL2 forms, but did go ahead and download it and MD completed. Will leave up front for pick-up.Marland KitchenJohny Chess

## 2021-10-22 ENCOUNTER — Other Ambulatory Visit: Payer: Self-pay

## 2021-10-22 ENCOUNTER — Encounter: Payer: Self-pay | Admitting: Internal Medicine

## 2021-10-22 ENCOUNTER — Ambulatory Visit (INDEPENDENT_AMBULATORY_CARE_PROVIDER_SITE_OTHER): Payer: Medicare Other | Admitting: Internal Medicine

## 2021-10-22 VITALS — BP 142/78 | HR 72 | Temp 97.8°F | Ht 73.0 in

## 2021-10-22 DIAGNOSIS — E1122 Type 2 diabetes mellitus with diabetic chronic kidney disease: Secondary | ICD-10-CM

## 2021-10-22 DIAGNOSIS — W19XXXD Unspecified fall, subsequent encounter: Secondary | ICD-10-CM | POA: Diagnosis not present

## 2021-10-22 DIAGNOSIS — R269 Unspecified abnormalities of gait and mobility: Secondary | ICD-10-CM | POA: Diagnosis not present

## 2021-10-22 DIAGNOSIS — R296 Repeated falls: Secondary | ICD-10-CM | POA: Insufficient documentation

## 2021-10-22 DIAGNOSIS — W19XXXA Unspecified fall, initial encounter: Secondary | ICD-10-CM | POA: Insufficient documentation

## 2021-10-22 DIAGNOSIS — F02B0 Dementia in other diseases classified elsewhere, moderate, without behavioral disturbance, psychotic disturbance, mood disturbance, and anxiety: Secondary | ICD-10-CM | POA: Diagnosis not present

## 2021-10-22 DIAGNOSIS — N183 Chronic kidney disease, stage 3 unspecified: Secondary | ICD-10-CM | POA: Diagnosis not present

## 2021-10-22 DIAGNOSIS — G20A1 Parkinson's disease without dyskinesia, without mention of fluctuations: Secondary | ICD-10-CM

## 2021-10-22 DIAGNOSIS — M48062 Spinal stenosis, lumbar region with neurogenic claudication: Secondary | ICD-10-CM

## 2021-10-22 DIAGNOSIS — Z23 Encounter for immunization: Secondary | ICD-10-CM

## 2021-10-22 DIAGNOSIS — G20 Parkinson's disease: Secondary | ICD-10-CM | POA: Diagnosis not present

## 2021-10-22 DIAGNOSIS — E119 Type 2 diabetes mellitus without complications: Secondary | ICD-10-CM | POA: Diagnosis not present

## 2021-10-22 DIAGNOSIS — F0390 Unspecified dementia without behavioral disturbance: Secondary | ICD-10-CM | POA: Insufficient documentation

## 2021-10-22 LAB — COMPREHENSIVE METABOLIC PANEL
ALT: 7 U/L (ref 0–53)
AST: 12 U/L (ref 0–37)
Albumin: 4.2 g/dL (ref 3.5–5.2)
Alkaline Phosphatase: 44 U/L (ref 39–117)
BUN: 33 mg/dL — ABNORMAL HIGH (ref 6–23)
CO2: 31 mEq/L (ref 19–32)
Calcium: 8.9 mg/dL (ref 8.4–10.5)
Chloride: 103 mEq/L (ref 96–112)
Creatinine, Ser: 1.81 mg/dL — ABNORMAL HIGH (ref 0.40–1.50)
GFR: 33.46 mL/min — ABNORMAL LOW (ref >60.00)
Glucose, Bld: 130 mg/dL — ABNORMAL HIGH (ref 70–99)
Potassium: 4.3 mEq/L (ref 3.5–5.1)
Sodium: 142 mEq/L (ref 135–145)
Total Bilirubin: 0.4 mg/dL (ref 0.2–1.2)
Total Protein: 6.8 g/dL (ref 6.0–8.3)

## 2021-10-22 LAB — CBC WITH DIFFERENTIAL/PLATELET
Basophils Absolute: 0 10*3/uL (ref 0.0–0.1)
Basophils Relative: 0.4 % (ref 0.0–3.0)
Eosinophils Absolute: 0.1 10*3/uL (ref 0.0–0.7)
Eosinophils Relative: 2.3 % (ref 0.0–5.0)
HCT: 42.8 % (ref 39.0–52.0)
Hemoglobin: 13.9 g/dL (ref 13.0–17.0)
Lymphocytes Relative: 22.4 % (ref 12.0–46.0)
Lymphs Abs: 1.2 10*3/uL (ref 0.7–4.0)
MCHC: 32.6 g/dL (ref 30.0–36.0)
MCV: 94.3 fl (ref 78.0–100.0)
Monocytes Absolute: 0.5 10*3/uL (ref 0.1–1.0)
Monocytes Relative: 10 % (ref 3.0–12.0)
Neutro Abs: 3.5 10*3/uL (ref 1.4–7.7)
Neutrophils Relative %: 64.9 % (ref 43.0–77.0)
Platelets: 208 10*3/uL (ref 150.0–400.0)
RBC: 4.54 Mil/uL (ref 4.22–5.81)
RDW: 13.5 % (ref 11.5–15.5)
WBC: 5.3 10*3/uL (ref 4.0–10.5)

## 2021-10-22 LAB — HEMOGLOBIN A1C: Hgb A1c MFr Bld: 6.9 % — ABNORMAL HIGH (ref 4.6–6.5)

## 2021-10-22 LAB — TSH: TSH: 1.92 u[IU]/mL (ref 0.35–5.50)

## 2021-10-22 NOTE — Assessment & Plan Note (Addendum)
Worsening Parkinson's dementia  [place] needs total care in a SNF. Wife is weak - currently w/a vertebral fx Forms for SNF - filled out

## 2021-10-22 NOTE — Assessment & Plan Note (Signed)
FTT - Edward [name] needs total care in a SNF. Wife is weak - currently w/a vertebral fx [name] needs a TB test Forms for SNF - filled out

## 2021-10-22 NOTE — Assessment & Plan Note (Signed)
Severe. Unable to stand/walk. He fell 13 times since his last OV. Edward [name] needs total care in a SNF. Wife is weak - currently w/a vertebral fx Forms for SNF - filled out

## 2021-10-22 NOTE — Assessment & Plan Note (Signed)
Severe. Unable to stand/walk. He fell 13 times since his last OV. [facility] needs total care in a SNF. Wife is weak - currently w/a vertebral fx Forms for SNF - filled out

## 2021-10-22 NOTE — Progress Notes (Signed)
Subjective:  Patient ID: Edward [name], male    DOB: [birth year]  Age: 87 y.o. MRN: [id number]  CC: Follow-up (Need referral to skill nursing facility. Wife states she can not take care of him anymore. He can not do anything for himself. She has hurt her back trying to lift him. He need flu shot & pneumonia shot)   HPI Edward [name] presents for FTT - Edward [name] needs total care in a SNF.  He is unable to walk or stand by himself.  He would require assistance of 1 or 2 people to be cared for.  Edward [name] is weak - currently w/a vertebral fx, she is in pain.  She is unable to care for Edward [name] at home.  He fell 9 times over the past several weeks. Case needs a TB test today.  We need to fill out an FL 2 form.  Edward [name] helps w/history   Outpatient Medications Prior to Visit  Medication Sig Dispense Refill   aspirin EC 81 MG tablet Take 81 mg by mouth daily.     candesartan (ATACAND) 32 MG tablet Take 1 tablet by mouth once daily 90 tablet 0   carbidopa-levodopa (SINEMET IR) 25-100 MG tablet TAKE 2 TABLETS BY MOUTH THREE TIMES DAILY (9AM,  NOON,  4PM) 540 tablet 1   ELIQUIS 5 MG TABS tablet Take 1 tablet by mouth twice daily 481 tablet 3   folic acid (FOLVITE) 1 MG tablet Take 1 tablet by mouth once daily 90 tablet 3   furosemide (LASIX) 20 MG tablet Take 20-40 mgs by mouth 3 times per week as needed for leg swelling 90 tablet 1   gabapentin (NEURONTIN) 300 MG capsule Take 1 capsule by mouth three times daily as needed 270 capsule 1   metFORMIN (GLUCOPHAGE) 500 MG tablet TAKE 1 TABLET BY MOUTH TWICE DAILY WITH A MEAL 180 tablet 0   Multiple Vitamins-Minerals (CENTRUM SILVER) tablet Take 1 tablet by mouth daily. Reported on 05/25/2016     niacin (NIASPAN) 500 MG CR tablet Take 1 tablet (500 mg total) by mouth daily. 90 tablet 3   ofloxacin (OCUFLOX) 0.3 % ophthalmic solution Place 0.3 drops into the left eye 4 (four) times daily. Place 1 drop in left eye four times a day     prednisoLONE  acetate (PRED FORTE) 1 % ophthalmic suspension Place 1 drop into the left eye 4 (four) times daily. Place 1 drop in left eye four times a day     vitamin B-12 (CYANOCOBALAMIN) 1000 MCG tablet Take 1,000 mcg by mouth daily.     brimonidine (ALPHAGAN) 0.2 % ophthalmic solution 1 drop 2 (two) times daily. (Patient not taking: Reported on 10/22/2021)     dorzolamide-timolol (COSOPT) 22.3-6.8 MG/ML ophthalmic solution 1 drop 2 (two) times daily. (Patient not taking: Reported on 10/22/2021)     ROCKLATAN 0.02-0.005 % SOLN Place 1 drop into both eyes at bedtime. (Patient not taking: Reported on 10/22/2021)     No facility-administered medications prior to visit.    ROS: Review of Systems  Constitutional:  Positive for fatigue. Negative for appetite change and unexpected weight change.  HENT:  Negative for congestion, nosebleeds, sneezing, sore throat and trouble swallowing.   Eyes:  Negative for itching and visual disturbance.  Respiratory:  Negative for cough.   Cardiovascular:  Negative for chest pain, palpitations and leg swelling.  Gastrointestinal:  Negative for abdominal distention, blood in stool, diarrhea and nausea.  Genitourinary:  Negative for frequency and  hematuria.  Musculoskeletal:  Positive for back pain and gait problem. Negative for joint swelling and neck pain.  Skin:  Negative for rash and wound.  Neurological:  Positive for dizziness, speech difficulty and weakness. Negative for tremors.  Psychiatric/Behavioral:  Positive for confusion and decreased concentration. Negative for agitation, dysphoric mood, sleep disturbance and suicidal ideas. The patient is not nervous/anxious.    Objective:  BP (!) 142/78 (BP Location: Left Arm)   Pulse 72   Temp 97.8 F (36.6 C) (Oral)   Ht 6\' 1"  (1.854 m)   SpO2 94%   BMI 32.32 kg/m   BP Readings from Last 3 Encounters:  10/22/21 (!) 142/78  06/09/21 134/78  01/29/21 (!) 142/82    Wt Readings from Last 3 Encounters:  06/09/21 245  lb (111.1 kg)  06/04/20 220 lb (99.8 kg)  06/19/19 214 lb (97.1 kg)    Physical Exam Constitutional:      General: He is not in acute distress.    Appearance: He is well-developed.     Comments: NAD  Eyes:     Conjunctiva/sclera: Conjunctivae normal.     Pupils: Pupils are equal, round, and reactive to light.  Neck:     Thyroid: No thyromegaly.     Vascular: No JVD.  Cardiovascular:     Rate and Rhythm: Normal rate and regular rhythm.     Heart sounds: Normal heart sounds. No murmur heard.   No friction rub. No gallop.  Pulmonary:     Effort: Pulmonary effort is normal. No respiratory distress.     Breath sounds: Normal breath sounds. No wheezing or rales.  Chest:     Chest wall: No tenderness.  Abdominal:     General: Bowel sounds are normal. There is no distension.     Palpations: Abdomen is soft. There is no mass.     Tenderness: There is no abdominal tenderness. There is no guarding or rebound.  Musculoskeletal:        General: No tenderness. Normal range of motion.     Cervical back: Normal range of motion.  Lymphadenopathy:     Cervical: No cervical adenopathy.  Skin:    General: Skin is warm and dry.     Findings: No rash.  Neurological:     Mental Status: He is alert. He is disoriented.     Cranial Nerves: No cranial nerve deficit.     Motor: Weakness present. No abnormal muscle tone.     Coordination: Coordination abnormal.     Gait: Gait abnormal.     Deep Tendon Reflexes: Reflexes are normal and symmetric.  Psychiatric:        Behavior: Behavior normal.  In a w/c Unable to stand, needs assistance of 1-2 people to help him stand.  Left lateral elbow is great.  Left knee with small abrasion, fresh    A total time of 45 minutes was spent preparing to see the patient, reviewing tests, x-rays,  records.  Also, obtaining history and performing comprehensive physical exam.  Additionally, counseling the patient and wife regarding the above listed issues: FTT, SNF  placement, falls.   Finally, documenting clinical information in the health records, coordination of care - SNF, FL 2 form.   Lab Results  Component Value Date   WBC 5.3 10/22/2021   HGB 13.9 10/22/2021   HCT 42.8 10/22/2021   PLT 208.0 10/22/2021   GLUCOSE 130 (H) 10/22/2021   CHOL 147 01/29/2021   TRIG 208.0 (H) 01/29/2021  HDL 29.60 (L) 01/29/2021   LDLDIRECT 104.0 01/29/2021   LDLCALC 88 09/24/2015   ALT 7 10/22/2021   AST 12 10/22/2021   NA 142 10/22/2021   K 4.3 10/22/2021   CL 103 10/22/2021   CREATININE 1.81 (H) 10/22/2021   BUN 33 (H) 10/22/2021   CO2 31 10/22/2021   TSH 1.92 10/22/2021   PSA 0.00 (L) 01/29/2021   INR 0.96 04/14/2017   HGBA1C 6.9 (H) 10/22/2021    DG HIP UNILAT WITH PELVIS 2-3 VIEWS RIGHT  Result Date: 09/07/2019 CLINICAL DATA:  Fall 2 weeks prior EXAM: DG HIP (WITH OR WITHOUT PELVIS) 2-3V RIGHT COMPARISON:  PET-CT May 16, 2014 FINDINGS: There is no evidence of hip fracture or dislocation. Moderate degenerative changes of the right hip with subchondral sclerosis and spurring of the acetabulum. Enthesopathic changes are noted upon the right iliac crest and ischial tuberosities. Minimal degenerative changes are present in the right SI joint. Metallic radiodensities at the level of the prostate could reflect either fiducial or brachytherapy seeds, correlate with procedure history. Vascular calcifications are noted in medial right thigh. IMPRESSION: 1. No acute osseous abnormality. 2. Moderate right hip degenerative change. Electronically Signed   By: Lovena Le M.D.   On: 09/07/2019 03:52    Assessment & Plan:   Problem List Items Addressed This Visit     Dementia (Edward [name])    Worsening Parkinson's dementia  Edward [name] needs total care in a SNF. Wife is weak - currently w/a vertebral fx Forms for SNF - filled out      Relevant Orders   CBC with Differential/Platelet (Completed)   TSH (Completed)   Diabetes mellitus type 2 in nonobese (HCC)    Check  A1c      Relevant Orders   QuantiFERON-TB Gold Plus   CBC with Differential/Platelet (Completed)   Comprehensive metabolic panel (Completed)   Hemoglobin A1c (Completed)   TSH (Completed)   Falls - Primary    Severe. Unable to stand/walk. He fell 13 times since his last OV. Edward [name] needs total care in a SNF. Wife is weak - currently w/a vertebral fx Forms for SNF - filled out      Relevant Orders   CBC with Differential/Platelet (Completed)   TSH (Completed)   Gait disorder    Severe. Unable to stand/walk. He fell 13 times since his last OV. Edward [name] needs total care in a SNF. Wife is weak - currently w/a vertebral fx Forms for SNF - filled out      Parkinson disease ([place])     FTT - Edward [name] needs total care in a SNF. Wife is weak - currently w/a vertebral fx Edward [name] needs a TB test Forms for SNF - filled out      Spinal stenosis, lumbar region, with neurogenic claudication    Severe. Unable to stand/walk. He fell 13 times since his last OV. [place] needs total care in a SNF. Wife is weak - currently w/a vertebral fx Forms for SNF - filled out      Relevant Orders   CBC with Differential/Platelet (Completed)   Stage 3 chronic renal impairment associated with type 2 diabetes mellitus ([place])   Relevant Orders   Comprehensive metabolic panel (Completed)   Other Visit Diagnoses     Need for immunization against influenza       Relevant Orders   Flu Vaccine QUAD High Dose(Fluad) (Completed)   Need for vaccination       Relevant Orders   Pneumococcal conjugate vaccine 20-valent (  Prevnar 20) (Completed)         No orders of the defined types were placed in this encounter.     Follow-up: Return for a follow-up visit.  Edward Kehr, MD

## 2021-10-22 NOTE — Assessment & Plan Note (Signed)
Check A1c. 

## 2021-10-24 LAB — QUANTIFERON-TB GOLD PLUS
Mitogen-NIL: 4.04 IU/mL
NIL: 0.02 IU/mL
QuantiFERON-TB Gold Plus: NEGATIVE
TB1-NIL: 0.03 IU/mL
TB2-NIL: 0.02 IU/mL

## 2021-10-26 ENCOUNTER — Telehealth: Payer: Self-pay

## 2021-10-26 MED ORDER — DORZOLAMIDE HCL-TIMOLOL MAL 2-0.5 % OP SOLN: 1.0000 [drp] | Freq: Two times a day (BID) | OPHTHALMIC | 0 refills | Status: DC

## 2021-10-26 MED ORDER — ROCKLATAN 0.02-0.005 % OP SOLN: 1.0000 [drp] | Freq: Every day | OPHTHALMIC | 0 refills | Status: DC

## 2021-10-26 MED ORDER — BRIMONIDINE TARTRATE 0.2 % OP SOLN: 1.0000 [drp] | Freq: Two times a day (BID) | OPHTHALMIC | Status: DC

## 2021-10-26 NOTE — Telephone Encounter (Signed)
Called wife she states need other 3 eye drops added back and husband is not taking te Prednisolone Or Ocuflox drops. Also were diet he is an diabetic and his food must be chopped. Inform pt will complete FL2 form again and let her know when to pick up. Wife states we can just mail FL2 form to her since they do not drive.Marland KitchenJohny Chess

## 2021-10-26 NOTE — Telephone Encounter (Signed)
FL2 form (Medication had changed however the eye drop medications has not been order)  3 medications were left off   Brimonidine  Dorcolamide  Rocklatan  Requesting call [phone]

## 2021-10-27 NOTE — Telephone Encounter (Signed)
Completed another FL2 added eye drops. Mail to wife and a copy of lab work as well...Andee Poles

## 2021-11-05 NOTE — Telephone Encounter (Signed)
Mary calling in  [facility] she never received FL2 form in the mail  Would like for nurse to place copy of front for pick up  Please call her & let her know when this has been done

## 2021-11-06 NOTE — Telephone Encounter (Signed)
Notified pt wife inform her our mail turn around time can take up to 2-3 days leaving the office, but it was mailed out. Will leave copy up front for pick-up.Marland KitchenJohny [name]

## 2021-12-07 ENCOUNTER — Other Ambulatory Visit: Payer: Self-pay | Admitting: Internal Medicine

## 2021-12-07 ENCOUNTER — Other Ambulatory Visit: Payer: Self-pay | Admitting: Neurology

## 2021-12-07 ENCOUNTER — Other Ambulatory Visit: Payer: Self-pay

## 2021-12-07 ENCOUNTER — Telehealth: Payer: Self-pay | Admitting: Neurology

## 2021-12-07 DIAGNOSIS — G231 Progressive supranuclear ophthalmoplegia [Steele-Richardson-Olszewski]: Secondary | ICD-10-CM

## 2021-12-07 MED ORDER — CARBIDOPA-LEVODOPA 25-100 MG PO TABS: ORAL_TABLET | ORAL | 0 refills | Status: DC

## 2021-12-07 NOTE — Telephone Encounter (Signed)
Pt wife called and states patient is going to a long term facility and they will need  a 90 day supply of the Carbidopa Levodopa  to take with him on 12-23-21.   The facility is in [place] and she canceled the appt on 12-22-21 for him with Dr Tat    She uses the [facility]

## 2021-12-07 NOTE — Telephone Encounter (Signed)
Called and spoke to patients wife and let her know I am calling in 90 days worth of medication for this patient. We will be unable to refill any more medication requests and the facility , Hospice or palliative care will have to take over the refills from this point on with out an appointment

## 2021-12-22 ENCOUNTER — Ambulatory Visit: Payer: Medicare Other | Admitting: Neurology

## 2021-12-23 DIAGNOSIS — R269 Unspecified abnormalities of gait and mobility: Secondary | ICD-10-CM | POA: Diagnosis not present

## 2021-12-23 DIAGNOSIS — I82409 Acute embolism and thrombosis of unspecified deep veins of unspecified lower extremity: Secondary | ICD-10-CM | POA: Diagnosis not present

## 2021-12-23 DIAGNOSIS — R6889 Other general symptoms and signs: Secondary | ICD-10-CM | POA: Diagnosis not present

## 2021-12-23 DIAGNOSIS — G20 Parkinson's disease: Secondary | ICD-10-CM | POA: Diagnosis not present

## 2021-12-24 DIAGNOSIS — E119 Type 2 diabetes mellitus without complications: Secondary | ICD-10-CM | POA: Diagnosis not present

## 2021-12-24 DIAGNOSIS — R1312 Dysphagia, oropharyngeal phase: Secondary | ICD-10-CM | POA: Diagnosis not present

## 2021-12-24 DIAGNOSIS — I10 Essential (primary) hypertension: Secondary | ICD-10-CM | POA: Diagnosis not present

## 2021-12-24 DIAGNOSIS — N183 Chronic kidney disease, stage 3 unspecified: Secondary | ICD-10-CM | POA: Diagnosis not present

## 2021-12-24 DIAGNOSIS — G20 Parkinson's disease: Secondary | ICD-10-CM | POA: Diagnosis not present

## 2021-12-25 DIAGNOSIS — I10 Essential (primary) hypertension: Secondary | ICD-10-CM | POA: Diagnosis not present

## 2021-12-25 DIAGNOSIS — N183 Chronic kidney disease, stage 3 unspecified: Secondary | ICD-10-CM | POA: Diagnosis not present

## 2021-12-25 DIAGNOSIS — G20 Parkinson's disease: Secondary | ICD-10-CM | POA: Diagnosis not present

## 2021-12-25 DIAGNOSIS — E119 Type 2 diabetes mellitus without complications: Secondary | ICD-10-CM | POA: Diagnosis not present

## 2021-12-25 DIAGNOSIS — R1312 Dysphagia, oropharyngeal phase: Secondary | ICD-10-CM | POA: Diagnosis not present

## 2021-12-26 DIAGNOSIS — Z20822 Contact with and (suspected) exposure to covid-19: Secondary | ICD-10-CM | POA: Diagnosis not present

## 2021-12-28 DIAGNOSIS — G20 Parkinson's disease: Secondary | ICD-10-CM | POA: Diagnosis not present

## 2021-12-28 DIAGNOSIS — N183 Chronic kidney disease, stage 3 unspecified: Secondary | ICD-10-CM | POA: Diagnosis not present

## 2021-12-28 DIAGNOSIS — R1312 Dysphagia, oropharyngeal phase: Secondary | ICD-10-CM | POA: Diagnosis not present

## 2021-12-28 DIAGNOSIS — I10 Essential (primary) hypertension: Secondary | ICD-10-CM | POA: Diagnosis not present

## 2021-12-28 DIAGNOSIS — E119 Type 2 diabetes mellitus without complications: Secondary | ICD-10-CM | POA: Diagnosis not present

## 2021-12-29 DIAGNOSIS — E119 Type 2 diabetes mellitus without complications: Secondary | ICD-10-CM | POA: Diagnosis not present

## 2021-12-29 DIAGNOSIS — I10 Essential (primary) hypertension: Secondary | ICD-10-CM | POA: Diagnosis not present

## 2021-12-29 DIAGNOSIS — N183 Chronic kidney disease, stage 3 unspecified: Secondary | ICD-10-CM | POA: Diagnosis not present

## 2021-12-29 DIAGNOSIS — R531 Weakness: Secondary | ICD-10-CM | POA: Diagnosis not present

## 2021-12-29 DIAGNOSIS — R1312 Dysphagia, oropharyngeal phase: Secondary | ICD-10-CM | POA: Diagnosis not present

## 2021-12-29 DIAGNOSIS — G20 Parkinson's disease: Secondary | ICD-10-CM | POA: Diagnosis not present

## 2021-12-30 DIAGNOSIS — R1312 Dysphagia, oropharyngeal phase: Secondary | ICD-10-CM | POA: Diagnosis not present

## 2021-12-30 DIAGNOSIS — E119 Type 2 diabetes mellitus without complications: Secondary | ICD-10-CM | POA: Diagnosis not present

## 2021-12-30 DIAGNOSIS — N183 Chronic kidney disease, stage 3 unspecified: Secondary | ICD-10-CM | POA: Diagnosis not present

## 2021-12-30 DIAGNOSIS — G20 Parkinson's disease: Secondary | ICD-10-CM | POA: Diagnosis not present

## 2021-12-30 DIAGNOSIS — I10 Essential (primary) hypertension: Secondary | ICD-10-CM | POA: Diagnosis not present

## 2021-12-31 DIAGNOSIS — E119 Type 2 diabetes mellitus without complications: Secondary | ICD-10-CM | POA: Diagnosis not present

## 2021-12-31 DIAGNOSIS — Z7409 Other reduced mobility: Secondary | ICD-10-CM | POA: Diagnosis not present

## 2021-12-31 DIAGNOSIS — R1312 Dysphagia, oropharyngeal phase: Secondary | ICD-10-CM | POA: Diagnosis not present

## 2021-12-31 DIAGNOSIS — N183 Chronic kidney disease, stage 3 unspecified: Secondary | ICD-10-CM | POA: Diagnosis not present

## 2021-12-31 DIAGNOSIS — G20 Parkinson's disease: Secondary | ICD-10-CM | POA: Diagnosis not present

## 2021-12-31 DIAGNOSIS — R269 Unspecified abnormalities of gait and mobility: Secondary | ICD-10-CM | POA: Diagnosis not present

## 2021-12-31 DIAGNOSIS — I4891 Unspecified atrial fibrillation: Secondary | ICD-10-CM | POA: Diagnosis not present

## 2021-12-31 DIAGNOSIS — I10 Essential (primary) hypertension: Secondary | ICD-10-CM | POA: Diagnosis not present

## 2022-01-01 DIAGNOSIS — G20 Parkinson's disease: Secondary | ICD-10-CM | POA: Diagnosis not present

## 2022-01-01 DIAGNOSIS — R1312 Dysphagia, oropharyngeal phase: Secondary | ICD-10-CM | POA: Diagnosis not present

## 2022-01-01 DIAGNOSIS — E119 Type 2 diabetes mellitus without complications: Secondary | ICD-10-CM | POA: Diagnosis not present

## 2022-01-01 DIAGNOSIS — I10 Essential (primary) hypertension: Secondary | ICD-10-CM | POA: Diagnosis not present

## 2022-01-01 DIAGNOSIS — N183 Chronic kidney disease, stage 3 unspecified: Secondary | ICD-10-CM | POA: Diagnosis not present

## 2022-01-02 DIAGNOSIS — I10 Essential (primary) hypertension: Secondary | ICD-10-CM | POA: Diagnosis not present

## 2022-01-02 DIAGNOSIS — G20 Parkinson's disease: Secondary | ICD-10-CM | POA: Diagnosis not present

## 2022-01-02 DIAGNOSIS — R1312 Dysphagia, oropharyngeal phase: Secondary | ICD-10-CM | POA: Diagnosis not present

## 2022-01-02 DIAGNOSIS — N183 Chronic kidney disease, stage 3 unspecified: Secondary | ICD-10-CM | POA: Diagnosis not present

## 2022-01-02 DIAGNOSIS — E119 Type 2 diabetes mellitus without complications: Secondary | ICD-10-CM | POA: Diagnosis not present

## 2022-01-04 DIAGNOSIS — E119 Type 2 diabetes mellitus without complications: Secondary | ICD-10-CM | POA: Diagnosis not present

## 2022-01-04 DIAGNOSIS — R1312 Dysphagia, oropharyngeal phase: Secondary | ICD-10-CM | POA: Diagnosis not present

## 2022-01-04 DIAGNOSIS — N183 Chronic kidney disease, stage 3 unspecified: Secondary | ICD-10-CM | POA: Diagnosis not present

## 2022-01-04 DIAGNOSIS — I10 Essential (primary) hypertension: Secondary | ICD-10-CM | POA: Diagnosis not present

## 2022-01-04 DIAGNOSIS — G20 Parkinson's disease: Secondary | ICD-10-CM | POA: Diagnosis not present

## 2022-01-05 DIAGNOSIS — R1312 Dysphagia, oropharyngeal phase: Secondary | ICD-10-CM | POA: Diagnosis not present

## 2022-01-05 DIAGNOSIS — G20 Parkinson's disease: Secondary | ICD-10-CM | POA: Diagnosis not present

## 2022-01-05 DIAGNOSIS — E119 Type 2 diabetes mellitus without complications: Secondary | ICD-10-CM | POA: Diagnosis not present

## 2022-01-05 DIAGNOSIS — R531 Weakness: Secondary | ICD-10-CM | POA: Diagnosis not present

## 2022-01-05 DIAGNOSIS — N183 Chronic kidney disease, stage 3 unspecified: Secondary | ICD-10-CM | POA: Diagnosis not present

## 2022-01-05 DIAGNOSIS — I10 Essential (primary) hypertension: Secondary | ICD-10-CM | POA: Diagnosis not present

## 2022-01-06 DIAGNOSIS — N183 Chronic kidney disease, stage 3 unspecified: Secondary | ICD-10-CM | POA: Diagnosis not present

## 2022-01-06 DIAGNOSIS — R1312 Dysphagia, oropharyngeal phase: Secondary | ICD-10-CM | POA: Diagnosis not present

## 2022-01-06 DIAGNOSIS — I10 Essential (primary) hypertension: Secondary | ICD-10-CM | POA: Diagnosis not present

## 2022-01-06 DIAGNOSIS — E119 Type 2 diabetes mellitus without complications: Secondary | ICD-10-CM | POA: Diagnosis not present

## 2022-01-06 DIAGNOSIS — G20 Parkinson's disease: Secondary | ICD-10-CM | POA: Diagnosis not present

## 2022-01-07 DIAGNOSIS — R1312 Dysphagia, oropharyngeal phase: Secondary | ICD-10-CM | POA: Diagnosis not present

## 2022-01-07 DIAGNOSIS — I10 Essential (primary) hypertension: Secondary | ICD-10-CM | POA: Diagnosis not present

## 2022-01-07 DIAGNOSIS — N183 Chronic kidney disease, stage 3 unspecified: Secondary | ICD-10-CM | POA: Diagnosis not present

## 2022-01-07 DIAGNOSIS — E119 Type 2 diabetes mellitus without complications: Secondary | ICD-10-CM | POA: Diagnosis not present

## 2022-01-07 DIAGNOSIS — G20 Parkinson's disease: Secondary | ICD-10-CM | POA: Diagnosis not present

## 2022-01-08 DIAGNOSIS — G20 Parkinson's disease: Secondary | ICD-10-CM | POA: Diagnosis not present

## 2022-01-08 DIAGNOSIS — I10 Essential (primary) hypertension: Secondary | ICD-10-CM | POA: Diagnosis not present

## 2022-01-08 DIAGNOSIS — R1312 Dysphagia, oropharyngeal phase: Secondary | ICD-10-CM | POA: Diagnosis not present

## 2022-01-08 DIAGNOSIS — N183 Chronic kidney disease, stage 3 unspecified: Secondary | ICD-10-CM | POA: Diagnosis not present

## 2022-01-08 DIAGNOSIS — E119 Type 2 diabetes mellitus without complications: Secondary | ICD-10-CM | POA: Diagnosis not present

## 2022-01-11 DIAGNOSIS — G20 Parkinson's disease: Secondary | ICD-10-CM | POA: Diagnosis not present

## 2022-01-11 DIAGNOSIS — I10 Essential (primary) hypertension: Secondary | ICD-10-CM | POA: Diagnosis not present

## 2022-01-11 DIAGNOSIS — R1312 Dysphagia, oropharyngeal phase: Secondary | ICD-10-CM | POA: Diagnosis not present

## 2022-01-11 DIAGNOSIS — E119 Type 2 diabetes mellitus without complications: Secondary | ICD-10-CM | POA: Diagnosis not present

## 2022-01-11 DIAGNOSIS — N183 Chronic kidney disease, stage 3 unspecified: Secondary | ICD-10-CM | POA: Diagnosis not present

## 2022-01-12 DIAGNOSIS — E119 Type 2 diabetes mellitus without complications: Secondary | ICD-10-CM | POA: Diagnosis not present

## 2022-01-12 DIAGNOSIS — N183 Chronic kidney disease, stage 3 unspecified: Secondary | ICD-10-CM | POA: Diagnosis not present

## 2022-01-12 DIAGNOSIS — I10 Essential (primary) hypertension: Secondary | ICD-10-CM | POA: Diagnosis not present

## 2022-01-12 DIAGNOSIS — R1312 Dysphagia, oropharyngeal phase: Secondary | ICD-10-CM | POA: Diagnosis not present

## 2022-01-12 DIAGNOSIS — G20 Parkinson's disease: Secondary | ICD-10-CM | POA: Diagnosis not present

## 2022-01-13 DIAGNOSIS — E119 Type 2 diabetes mellitus without complications: Secondary | ICD-10-CM | POA: Diagnosis not present

## 2022-01-13 DIAGNOSIS — G20 Parkinson's disease: Secondary | ICD-10-CM | POA: Diagnosis not present

## 2022-01-13 DIAGNOSIS — N183 Chronic kidney disease, stage 3 unspecified: Secondary | ICD-10-CM | POA: Diagnosis not present

## 2022-01-13 DIAGNOSIS — I10 Essential (primary) hypertension: Secondary | ICD-10-CM | POA: Diagnosis not present

## 2022-01-13 DIAGNOSIS — R1312 Dysphagia, oropharyngeal phase: Secondary | ICD-10-CM | POA: Diagnosis not present

## 2022-01-14 DIAGNOSIS — I10 Essential (primary) hypertension: Secondary | ICD-10-CM | POA: Diagnosis not present

## 2022-01-14 DIAGNOSIS — N183 Chronic kidney disease, stage 3 unspecified: Secondary | ICD-10-CM | POA: Diagnosis not present

## 2022-01-14 DIAGNOSIS — R1312 Dysphagia, oropharyngeal phase: Secondary | ICD-10-CM | POA: Diagnosis not present

## 2022-01-14 DIAGNOSIS — G20 Parkinson's disease: Secondary | ICD-10-CM | POA: Diagnosis not present

## 2022-01-14 DIAGNOSIS — E119 Type 2 diabetes mellitus without complications: Secondary | ICD-10-CM | POA: Diagnosis not present

## 2022-01-15 DIAGNOSIS — R1312 Dysphagia, oropharyngeal phase: Secondary | ICD-10-CM | POA: Diagnosis not present

## 2022-01-15 DIAGNOSIS — N183 Chronic kidney disease, stage 3 unspecified: Secondary | ICD-10-CM | POA: Diagnosis not present

## 2022-01-15 DIAGNOSIS — G20 Parkinson's disease: Secondary | ICD-10-CM | POA: Diagnosis not present

## 2022-01-15 DIAGNOSIS — E119 Type 2 diabetes mellitus without complications: Secondary | ICD-10-CM | POA: Diagnosis not present

## 2022-01-15 DIAGNOSIS — I10 Essential (primary) hypertension: Secondary | ICD-10-CM | POA: Diagnosis not present

## 2022-01-16 DIAGNOSIS — E119 Type 2 diabetes mellitus without complications: Secondary | ICD-10-CM | POA: Diagnosis not present

## 2022-01-16 DIAGNOSIS — R1312 Dysphagia, oropharyngeal phase: Secondary | ICD-10-CM | POA: Diagnosis not present

## 2022-01-16 DIAGNOSIS — G20 Parkinson's disease: Secondary | ICD-10-CM | POA: Diagnosis not present

## 2022-01-16 DIAGNOSIS — N183 Chronic kidney disease, stage 3 unspecified: Secondary | ICD-10-CM | POA: Diagnosis not present

## 2022-01-16 DIAGNOSIS — I10 Essential (primary) hypertension: Secondary | ICD-10-CM | POA: Diagnosis not present

## 2022-01-17 DIAGNOSIS — G20 Parkinson's disease: Secondary | ICD-10-CM | POA: Diagnosis not present

## 2022-01-17 DIAGNOSIS — E119 Type 2 diabetes mellitus without complications: Secondary | ICD-10-CM | POA: Diagnosis not present

## 2022-01-17 DIAGNOSIS — I10 Essential (primary) hypertension: Secondary | ICD-10-CM | POA: Diagnosis not present

## 2022-01-17 DIAGNOSIS — N183 Chronic kidney disease, stage 3 unspecified: Secondary | ICD-10-CM | POA: Diagnosis not present

## 2022-01-17 DIAGNOSIS — R1312 Dysphagia, oropharyngeal phase: Secondary | ICD-10-CM | POA: Diagnosis not present

## 2022-01-18 DIAGNOSIS — G20 Parkinson's disease: Secondary | ICD-10-CM | POA: Diagnosis not present

## 2022-01-18 DIAGNOSIS — I10 Essential (primary) hypertension: Secondary | ICD-10-CM | POA: Diagnosis not present

## 2022-01-18 DIAGNOSIS — E119 Type 2 diabetes mellitus without complications: Secondary | ICD-10-CM | POA: Diagnosis not present

## 2022-01-18 DIAGNOSIS — N183 Chronic kidney disease, stage 3 unspecified: Secondary | ICD-10-CM | POA: Diagnosis not present

## 2022-01-18 DIAGNOSIS — R1312 Dysphagia, oropharyngeal phase: Secondary | ICD-10-CM | POA: Diagnosis not present

## 2022-01-21 DIAGNOSIS — G20 Parkinson's disease: Secondary | ICD-10-CM | POA: Diagnosis not present

## 2022-01-21 DIAGNOSIS — I4891 Unspecified atrial fibrillation: Secondary | ICD-10-CM | POA: Diagnosis not present

## 2022-01-21 DIAGNOSIS — I10 Essential (primary) hypertension: Secondary | ICD-10-CM | POA: Diagnosis not present

## 2022-01-21 DIAGNOSIS — R634 Abnormal weight loss: Secondary | ICD-10-CM | POA: Diagnosis not present

## 2022-01-21 DIAGNOSIS — R600 Localized edema: Secondary | ICD-10-CM | POA: Diagnosis not present

## 2022-01-22 DIAGNOSIS — I10 Essential (primary) hypertension: Secondary | ICD-10-CM | POA: Diagnosis not present

## 2022-02-02 DIAGNOSIS — S90822A Blister (nonthermal), left foot, initial encounter: Secondary | ICD-10-CM | POA: Diagnosis not present

## 2022-02-02 DIAGNOSIS — R630 Anorexia: Secondary | ICD-10-CM | POA: Diagnosis not present

## 2022-02-02 DIAGNOSIS — G20 Parkinson's disease: Secondary | ICD-10-CM | POA: Diagnosis not present

## 2022-02-02 DIAGNOSIS — S90821A Blister (nonthermal), right foot, initial encounter: Secondary | ICD-10-CM | POA: Diagnosis not present

## 2022-02-02 DIAGNOSIS — R609 Edema, unspecified: Secondary | ICD-10-CM | POA: Diagnosis not present

## 2022-02-02 DIAGNOSIS — R634 Abnormal weight loss: Secondary | ICD-10-CM | POA: Diagnosis not present

## 2022-02-03 DIAGNOSIS — L539 Erythematous condition, unspecified: Secondary | ICD-10-CM | POA: Diagnosis not present

## 2022-02-03 DIAGNOSIS — R2243 Localized swelling, mass and lump, lower limb, bilateral: Secondary | ICD-10-CM | POA: Diagnosis not present

## 2022-02-03 DIAGNOSIS — I70213 Atherosclerosis of native arteries of extremities with intermittent claudication, bilateral legs: Secondary | ICD-10-CM | POA: Diagnosis not present

## 2022-02-03 DIAGNOSIS — E1159 Type 2 diabetes mellitus with other circulatory complications: Secondary | ICD-10-CM | POA: Diagnosis not present

## 2022-02-22 DIAGNOSIS — I10 Essential (primary) hypertension: Secondary | ICD-10-CM | POA: Diagnosis not present

## 2022-02-22 DIAGNOSIS — R600 Localized edema: Secondary | ICD-10-CM | POA: Diagnosis not present

## 2022-02-22 DIAGNOSIS — R269 Unspecified abnormalities of gait and mobility: Secondary | ICD-10-CM | POA: Diagnosis not present

## 2022-02-22 DIAGNOSIS — Z7409 Other reduced mobility: Secondary | ICD-10-CM | POA: Diagnosis not present

## 2022-02-22 DIAGNOSIS — R634 Abnormal weight loss: Secondary | ICD-10-CM | POA: Diagnosis not present

## 2022-02-22 DIAGNOSIS — G20 Parkinson's disease: Secondary | ICD-10-CM | POA: Diagnosis not present

## 2022-02-23 DIAGNOSIS — E039 Hypothyroidism, unspecified: Secondary | ICD-10-CM | POA: Diagnosis not present

## 2022-03-01 DIAGNOSIS — I10 Essential (primary) hypertension: Secondary | ICD-10-CM | POA: Diagnosis not present

## 2022-03-08 DIAGNOSIS — I10 Essential (primary) hypertension: Secondary | ICD-10-CM | POA: Diagnosis not present

## 2022-03-10 DIAGNOSIS — Z20822 Contact with and (suspected) exposure to covid-19: Secondary | ICD-10-CM | POA: Diagnosis not present

## 2022-03-15 DIAGNOSIS — I10 Essential (primary) hypertension: Secondary | ICD-10-CM | POA: Diagnosis not present

## 2022-03-17 DIAGNOSIS — I10 Essential (primary) hypertension: Secondary | ICD-10-CM | POA: Diagnosis not present

## 2022-03-17 DIAGNOSIS — I739 Peripheral vascular disease, unspecified: Secondary | ICD-10-CM | POA: Diagnosis not present

## 2022-03-17 DIAGNOSIS — G20 Parkinson's disease: Secondary | ICD-10-CM | POA: Diagnosis not present

## 2022-03-17 DIAGNOSIS — R4189 Other symptoms and signs involving cognitive functions and awareness: Secondary | ICD-10-CM | POA: Diagnosis not present

## 2022-03-24 DIAGNOSIS — I82409 Acute embolism and thrombosis of unspecified deep veins of unspecified lower extremity: Secondary | ICD-10-CM | POA: Diagnosis not present

## 2022-03-24 DIAGNOSIS — R4189 Other symptoms and signs involving cognitive functions and awareness: Secondary | ICD-10-CM | POA: Diagnosis not present

## 2022-03-24 DIAGNOSIS — G20 Parkinson's disease: Secondary | ICD-10-CM | POA: Diagnosis not present

## 2022-03-24 DIAGNOSIS — Z7409 Other reduced mobility: Secondary | ICD-10-CM | POA: Diagnosis not present

## 2022-03-31 ENCOUNTER — Ambulatory Visit: Payer: Medicare Other

## 2022-04-05 DIAGNOSIS — Z20822 Contact with and (suspected) exposure to covid-19: Secondary | ICD-10-CM | POA: Diagnosis not present

## 2022-04-17 DIAGNOSIS — Z20822 Contact with and (suspected) exposure to covid-19: Secondary | ICD-10-CM | POA: Diagnosis not present

## 2022-04-20 DIAGNOSIS — Z20822 Contact with and (suspected) exposure to covid-19: Secondary | ICD-10-CM | POA: Diagnosis not present

## 2022-04-26 DIAGNOSIS — R4189 Other symptoms and signs involving cognitive functions and awareness: Secondary | ICD-10-CM | POA: Diagnosis not present

## 2022-04-26 DIAGNOSIS — E119 Type 2 diabetes mellitus without complications: Secondary | ICD-10-CM | POA: Diagnosis not present

## 2022-04-26 DIAGNOSIS — R410 Disorientation, unspecified: Secondary | ICD-10-CM | POA: Diagnosis not present

## 2022-04-26 DIAGNOSIS — Z7409 Other reduced mobility: Secondary | ICD-10-CM | POA: Diagnosis not present

## 2022-04-26 DIAGNOSIS — G20 Parkinson's disease: Secondary | ICD-10-CM | POA: Diagnosis not present

## 2022-04-26 DIAGNOSIS — R4182 Altered mental status, unspecified: Secondary | ICD-10-CM | POA: Diagnosis not present

## 2022-04-27 DIAGNOSIS — N390 Urinary tract infection, site not specified: Secondary | ICD-10-CM | POA: Diagnosis not present

## 2022-04-27 DIAGNOSIS — I10 Essential (primary) hypertension: Secondary | ICD-10-CM | POA: Diagnosis not present

## 2022-04-30 DIAGNOSIS — L89614 Pressure ulcer of right heel, stage 4: Secondary | ICD-10-CM | POA: Diagnosis not present

## 2022-05-03 DIAGNOSIS — I10 Essential (primary) hypertension: Secondary | ICD-10-CM | POA: Diagnosis not present

## 2022-05-06 DIAGNOSIS — G20 Parkinson's disease: Secondary | ICD-10-CM | POA: Diagnosis not present

## 2022-05-06 DIAGNOSIS — I10 Essential (primary) hypertension: Secondary | ICD-10-CM | POA: Diagnosis not present

## 2022-05-06 DIAGNOSIS — R4182 Altered mental status, unspecified: Secondary | ICD-10-CM | POA: Diagnosis not present

## 2022-05-06 DIAGNOSIS — N390 Urinary tract infection, site not specified: Secondary | ICD-10-CM | POA: Diagnosis not present

## 2022-05-06 DIAGNOSIS — R410 Disorientation, unspecified: Secondary | ICD-10-CM | POA: Diagnosis not present

## 2022-05-07 DIAGNOSIS — M19071 Primary osteoarthritis, right ankle and foot: Secondary | ICD-10-CM | POA: Diagnosis not present

## 2022-05-07 DIAGNOSIS — L89614 Pressure ulcer of right heel, stage 4: Secondary | ICD-10-CM | POA: Diagnosis not present

## 2022-05-07 DIAGNOSIS — M79671 Pain in right foot: Secondary | ICD-10-CM | POA: Diagnosis not present

## 2022-05-08 DIAGNOSIS — M130 Polyarthritis, unspecified: Secondary | ICD-10-CM | POA: Diagnosis not present

## 2022-05-08 DIAGNOSIS — L89614 Pressure ulcer of right heel, stage 4: Secondary | ICD-10-CM | POA: Diagnosis not present

## 2022-05-11 DIAGNOSIS — R451 Restlessness and agitation: Secondary | ICD-10-CM | POA: Diagnosis not present

## 2022-05-12 DIAGNOSIS — F919 Conduct disorder, unspecified: Secondary | ICD-10-CM | POA: Diagnosis not present

## 2022-05-12 DIAGNOSIS — G20 Parkinson's disease: Secondary | ICD-10-CM | POA: Diagnosis not present

## 2022-05-12 DIAGNOSIS — R4189 Other symptoms and signs involving cognitive functions and awareness: Secondary | ICD-10-CM | POA: Diagnosis not present

## 2022-05-12 DIAGNOSIS — I739 Peripheral vascular disease, unspecified: Secondary | ICD-10-CM | POA: Diagnosis not present

## 2022-05-13 DIAGNOSIS — I10 Essential (primary) hypertension: Secondary | ICD-10-CM | POA: Diagnosis not present

## 2022-05-14 DIAGNOSIS — L89614 Pressure ulcer of right heel, stage 4: Secondary | ICD-10-CM | POA: Diagnosis not present

## 2022-05-14 DIAGNOSIS — L89322 Pressure ulcer of left buttock, stage 2: Secondary | ICD-10-CM | POA: Diagnosis not present

## 2022-05-17 DIAGNOSIS — I10 Essential (primary) hypertension: Secondary | ICD-10-CM | POA: Diagnosis not present

## 2022-05-19 DIAGNOSIS — S91301D Unspecified open wound, right foot, subsequent encounter: Secondary | ICD-10-CM | POA: Diagnosis not present

## 2022-05-19 DIAGNOSIS — G20 Parkinson's disease: Secondary | ICD-10-CM | POA: Diagnosis not present

## 2022-05-19 DIAGNOSIS — R4189 Other symptoms and signs involving cognitive functions and awareness: Secondary | ICD-10-CM | POA: Diagnosis not present

## 2022-05-19 DIAGNOSIS — N390 Urinary tract infection, site not specified: Secondary | ICD-10-CM | POA: Diagnosis not present

## 2022-05-19 DIAGNOSIS — S91109A Unspecified open wound of unspecified toe(s) without damage to nail, initial encounter: Secondary | ICD-10-CM | POA: Diagnosis not present

## 2022-05-20 DIAGNOSIS — R748 Abnormal levels of other serum enzymes: Secondary | ICD-10-CM | POA: Diagnosis not present

## 2022-05-21 DIAGNOSIS — L89323 Pressure ulcer of left buttock, stage 3: Secondary | ICD-10-CM | POA: Diagnosis not present

## 2022-05-21 DIAGNOSIS — L89614 Pressure ulcer of right heel, stage 4: Secondary | ICD-10-CM | POA: Diagnosis not present

## 2022-05-26 DIAGNOSIS — L89610 Pressure ulcer of right heel, unstageable: Secondary | ICD-10-CM | POA: Diagnosis not present

## 2022-05-26 DIAGNOSIS — F4320 Adjustment disorder, unspecified: Secondary | ICD-10-CM | POA: Diagnosis not present

## 2022-05-26 DIAGNOSIS — F419 Anxiety disorder, unspecified: Secondary | ICD-10-CM | POA: Diagnosis not present

## 2022-05-26 DIAGNOSIS — R4189 Other symptoms and signs involving cognitive functions and awareness: Secondary | ICD-10-CM | POA: Diagnosis not present

## 2022-05-26 DIAGNOSIS — I739 Peripheral vascular disease, unspecified: Secondary | ICD-10-CM | POA: Diagnosis not present

## 2022-05-26 DIAGNOSIS — G20 Parkinson's disease: Secondary | ICD-10-CM | POA: Diagnosis not present

## 2022-05-28 DIAGNOSIS — F4320 Adjustment disorder, unspecified: Secondary | ICD-10-CM | POA: Diagnosis not present

## 2022-05-28 DIAGNOSIS — L89323 Pressure ulcer of left buttock, stage 3: Secondary | ICD-10-CM | POA: Diagnosis not present

## 2022-05-28 DIAGNOSIS — L89614 Pressure ulcer of right heel, stage 4: Secondary | ICD-10-CM | POA: Diagnosis not present

## 2022-06-04 DIAGNOSIS — L89323 Pressure ulcer of left buttock, stage 3: Secondary | ICD-10-CM | POA: Diagnosis not present

## 2022-06-04 DIAGNOSIS — L89614 Pressure ulcer of right heel, stage 4: Secondary | ICD-10-CM | POA: Diagnosis not present

## 2022-06-11 DIAGNOSIS — L89614 Pressure ulcer of right heel, stage 4: Secondary | ICD-10-CM | POA: Diagnosis not present

## 2022-06-11 DIAGNOSIS — L89323 Pressure ulcer of left buttock, stage 3: Secondary | ICD-10-CM | POA: Diagnosis not present

## 2022-06-18 DIAGNOSIS — L89614 Pressure ulcer of right heel, stage 4: Secondary | ICD-10-CM | POA: Diagnosis not present

## 2022-06-18 DIAGNOSIS — L89323 Pressure ulcer of left buttock, stage 3: Secondary | ICD-10-CM | POA: Diagnosis not present

## 2022-06-25 DIAGNOSIS — L89614 Pressure ulcer of right heel, stage 4: Secondary | ICD-10-CM | POA: Diagnosis not present

## 2022-06-26 DIAGNOSIS — M25551 Pain in right hip: Secondary | ICD-10-CM | POA: Diagnosis not present

## 2022-06-26 DIAGNOSIS — E785 Hyperlipidemia, unspecified: Secondary | ICD-10-CM | POA: Diagnosis not present

## 2022-06-26 DIAGNOSIS — F0390 Unspecified dementia without behavioral disturbance: Secondary | ICD-10-CM | POA: Diagnosis not present

## 2022-06-26 DIAGNOSIS — M25552 Pain in left hip: Secondary | ICD-10-CM | POA: Diagnosis not present

## 2022-06-26 DIAGNOSIS — I129 Hypertensive chronic kidney disease with stage 1 through stage 4 chronic kidney disease, or unspecified chronic kidney disease: Secondary | ICD-10-CM | POA: Diagnosis not present

## 2022-06-26 DIAGNOSIS — N183 Chronic kidney disease, stage 3 unspecified: Secondary | ICD-10-CM | POA: Diagnosis not present

## 2022-06-26 DIAGNOSIS — S0990XA Unspecified injury of head, initial encounter: Secondary | ICD-10-CM | POA: Diagnosis not present

## 2022-06-26 DIAGNOSIS — E1122 Type 2 diabetes mellitus with diabetic chronic kidney disease: Secondary | ICD-10-CM | POA: Diagnosis not present

## 2022-06-26 DIAGNOSIS — Z888 Allergy status to other drugs, medicaments and biological substances status: Secondary | ICD-10-CM | POA: Diagnosis not present

## 2022-06-26 DIAGNOSIS — Z66 Do not resuscitate: Secondary | ICD-10-CM | POA: Diagnosis not present

## 2022-06-26 DIAGNOSIS — W19XXXA Unspecified fall, initial encounter: Secondary | ICD-10-CM | POA: Diagnosis not present

## 2022-06-27 DIAGNOSIS — R531 Weakness: Secondary | ICD-10-CM | POA: Diagnosis not present

## 2022-06-27 DIAGNOSIS — R52 Pain, unspecified: Secondary | ICD-10-CM | POA: Diagnosis not present

## 2022-07-02 DIAGNOSIS — L89614 Pressure ulcer of right heel, stage 4: Secondary | ICD-10-CM | POA: Diagnosis not present

## 2022-07-06 DIAGNOSIS — F4320 Adjustment disorder, unspecified: Secondary | ICD-10-CM | POA: Diagnosis not present

## 2022-07-06 DIAGNOSIS — F03B0 Unspecified dementia, moderate, without behavioral disturbance, psychotic disturbance, mood disturbance, and anxiety: Secondary | ICD-10-CM | POA: Diagnosis not present

## 2022-07-06 DIAGNOSIS — G20 Parkinson's disease: Secondary | ICD-10-CM | POA: Diagnosis not present

## 2022-07-07 DIAGNOSIS — F419 Anxiety disorder, unspecified: Secondary | ICD-10-CM | POA: Diagnosis not present

## 2022-07-07 DIAGNOSIS — G20 Parkinson's disease: Secondary | ICD-10-CM | POA: Diagnosis not present

## 2022-07-07 DIAGNOSIS — E119 Type 2 diabetes mellitus without complications: Secondary | ICD-10-CM | POA: Diagnosis not present

## 2022-07-07 DIAGNOSIS — I10 Essential (primary) hypertension: Secondary | ICD-10-CM | POA: Diagnosis not present

## 2022-07-07 DIAGNOSIS — Z7409 Other reduced mobility: Secondary | ICD-10-CM | POA: Diagnosis not present

## 2022-07-09 DIAGNOSIS — L89614 Pressure ulcer of right heel, stage 4: Secondary | ICD-10-CM | POA: Diagnosis not present

## 2022-07-16 DIAGNOSIS — L89614 Pressure ulcer of right heel, stage 4: Secondary | ICD-10-CM | POA: Diagnosis not present

## 2022-07-23 DIAGNOSIS — L89614 Pressure ulcer of right heel, stage 4: Secondary | ICD-10-CM | POA: Diagnosis not present

## 2022-07-30 DIAGNOSIS — L89614 Pressure ulcer of right heel, stage 4: Secondary | ICD-10-CM | POA: Diagnosis not present

## 2022-08-03 DIAGNOSIS — F4320 Adjustment disorder, unspecified: Secondary | ICD-10-CM | POA: Diagnosis not present

## 2022-08-03 DIAGNOSIS — F03B0 Unspecified dementia, moderate, without behavioral disturbance, psychotic disturbance, mood disturbance, and anxiety: Secondary | ICD-10-CM | POA: Diagnosis not present

## 2022-08-03 DIAGNOSIS — G20 Parkinson's disease: Secondary | ICD-10-CM | POA: Diagnosis not present

## 2022-08-04 DIAGNOSIS — F419 Anxiety disorder, unspecified: Secondary | ICD-10-CM | POA: Diagnosis not present

## 2022-08-04 DIAGNOSIS — F4320 Adjustment disorder, unspecified: Secondary | ICD-10-CM | POA: Diagnosis not present

## 2022-08-06 DIAGNOSIS — L89614 Pressure ulcer of right heel, stage 4: Secondary | ICD-10-CM | POA: Diagnosis not present

## 2022-08-13 DIAGNOSIS — L98419 Non-pressure chronic ulcer of buttock with unspecified severity: Secondary | ICD-10-CM | POA: Diagnosis not present

## 2022-08-20 DIAGNOSIS — L98419 Non-pressure chronic ulcer of buttock with unspecified severity: Secondary | ICD-10-CM | POA: Diagnosis not present

## 2022-08-25 DIAGNOSIS — R748 Abnormal levels of other serum enzymes: Secondary | ICD-10-CM | POA: Diagnosis not present

## 2022-08-27 DIAGNOSIS — L98419 Non-pressure chronic ulcer of buttock with unspecified severity: Secondary | ICD-10-CM | POA: Diagnosis not present

## 2022-08-30 ENCOUNTER — Telehealth: Payer: Self-pay | Admitting: Internal Medicine

## 2022-08-30 NOTE — Telephone Encounter (Signed)
LVM for pt to rtn my call to schedule AWV with [facility]. Call back # [phone] 

## 2022-08-31 DIAGNOSIS — G20 Parkinson's disease: Secondary | ICD-10-CM | POA: Diagnosis not present

## 2022-08-31 DIAGNOSIS — F419 Anxiety disorder, unspecified: Secondary | ICD-10-CM | POA: Diagnosis not present

## 2022-08-31 DIAGNOSIS — F03B0 Unspecified dementia, moderate, without behavioral disturbance, psychotic disturbance, mood disturbance, and anxiety: Secondary | ICD-10-CM | POA: Diagnosis not present

## 2022-09-03 DIAGNOSIS — L98419 Non-pressure chronic ulcer of buttock with unspecified severity: Secondary | ICD-10-CM | POA: Diagnosis not present

## 2022-09-06 DIAGNOSIS — I10 Essential (primary) hypertension: Secondary | ICD-10-CM | POA: Diagnosis not present

## 2022-09-06 DIAGNOSIS — E785 Hyperlipidemia, unspecified: Secondary | ICD-10-CM | POA: Diagnosis not present

## 2022-09-06 DIAGNOSIS — I4891 Unspecified atrial fibrillation: Secondary | ICD-10-CM | POA: Diagnosis not present

## 2022-09-06 DIAGNOSIS — G20 Parkinson's disease: Secondary | ICD-10-CM | POA: Diagnosis not present

## 2022-09-07 DIAGNOSIS — E119 Type 2 diabetes mellitus without complications: Secondary | ICD-10-CM | POA: Diagnosis not present

## 2022-09-10 DIAGNOSIS — L98419 Non-pressure chronic ulcer of buttock with unspecified severity: Secondary | ICD-10-CM | POA: Diagnosis not present

## 2022-09-15 DIAGNOSIS — F419 Anxiety disorder, unspecified: Secondary | ICD-10-CM | POA: Diagnosis not present

## 2022-09-15 DIAGNOSIS — F4320 Adjustment disorder, unspecified: Secondary | ICD-10-CM | POA: Diagnosis not present

## 2022-09-17 DIAGNOSIS — L98419 Non-pressure chronic ulcer of buttock with unspecified severity: Secondary | ICD-10-CM | POA: Diagnosis not present

## 2022-09-24 DIAGNOSIS — L98419 Non-pressure chronic ulcer of buttock with unspecified severity: Secondary | ICD-10-CM | POA: Diagnosis not present

## 2022-09-28 DIAGNOSIS — F03B0 Unspecified dementia, moderate, without behavioral disturbance, psychotic disturbance, mood disturbance, and anxiety: Secondary | ICD-10-CM | POA: Diagnosis not present

## 2022-09-28 DIAGNOSIS — E785 Hyperlipidemia, unspecified: Secondary | ICD-10-CM | POA: Diagnosis not present

## 2022-09-28 DIAGNOSIS — F0390 Unspecified dementia without behavioral disturbance: Secondary | ICD-10-CM | POA: Diagnosis not present

## 2022-09-28 DIAGNOSIS — N390 Urinary tract infection, site not specified: Secondary | ICD-10-CM | POA: Diagnosis not present

## 2022-09-29 DIAGNOSIS — F4320 Adjustment disorder, unspecified: Secondary | ICD-10-CM | POA: Diagnosis not present

## 2022-09-29 DIAGNOSIS — F419 Anxiety disorder, unspecified: Secondary | ICD-10-CM | POA: Diagnosis not present

## 2022-10-01 DIAGNOSIS — L98419 Non-pressure chronic ulcer of buttock with unspecified severity: Secondary | ICD-10-CM | POA: Diagnosis not present

## 2022-10-15 DIAGNOSIS — L98419 Non-pressure chronic ulcer of buttock with unspecified severity: Secondary | ICD-10-CM | POA: Diagnosis not present

## 2022-10-15 DIAGNOSIS — E119 Type 2 diabetes mellitus without complications: Secondary | ICD-10-CM | POA: Diagnosis not present

## 2022-10-22 DIAGNOSIS — L98419 Non-pressure chronic ulcer of buttock with unspecified severity: Secondary | ICD-10-CM | POA: Diagnosis not present

## 2022-10-22 DIAGNOSIS — E119 Type 2 diabetes mellitus without complications: Secondary | ICD-10-CM | POA: Diagnosis not present

## 2022-10-27 DIAGNOSIS — F4320 Adjustment disorder, unspecified: Secondary | ICD-10-CM | POA: Diagnosis not present

## 2022-10-27 DIAGNOSIS — F419 Anxiety disorder, unspecified: Secondary | ICD-10-CM | POA: Diagnosis not present

## 2022-11-01 DIAGNOSIS — E785 Hyperlipidemia, unspecified: Secondary | ICD-10-CM | POA: Diagnosis not present

## 2022-11-01 DIAGNOSIS — F0390 Unspecified dementia without behavioral disturbance: Secondary | ICD-10-CM | POA: Diagnosis not present

## 2022-11-02 DIAGNOSIS — F02B0 Dementia in other diseases classified elsewhere, moderate, without behavioral disturbance, psychotic disturbance, mood disturbance, and anxiety: Secondary | ICD-10-CM | POA: Diagnosis not present

## 2022-11-02 DIAGNOSIS — G20C Parkinsonism, unspecified: Secondary | ICD-10-CM | POA: Diagnosis not present

## 2022-11-10 DIAGNOSIS — I10 Essential (primary) hypertension: Secondary | ICD-10-CM | POA: Diagnosis not present

## 2022-11-10 DIAGNOSIS — Z7409 Other reduced mobility: Secondary | ICD-10-CM | POA: Diagnosis not present

## 2022-11-10 DIAGNOSIS — E119 Type 2 diabetes mellitus without complications: Secondary | ICD-10-CM | POA: Diagnosis not present

## 2022-11-10 DIAGNOSIS — R4189 Other symptoms and signs involving cognitive functions and awareness: Secondary | ICD-10-CM | POA: Diagnosis not present

## 2022-11-12 DIAGNOSIS — E119 Type 2 diabetes mellitus without complications: Secondary | ICD-10-CM | POA: Diagnosis not present

## 2022-11-12 DIAGNOSIS — L89153 Pressure ulcer of sacral region, stage 3: Secondary | ICD-10-CM | POA: Diagnosis not present

## 2022-11-15 DIAGNOSIS — R0603 Acute respiratory distress: Secondary | ICD-10-CM | POA: Diagnosis not present

## 2022-11-15 DIAGNOSIS — R531 Weakness: Secondary | ICD-10-CM | POA: Diagnosis not present

## 2022-11-15 DIAGNOSIS — R4182 Altered mental status, unspecified: Secondary | ICD-10-CM | POA: Diagnosis not present

## 2022-11-15 DIAGNOSIS — I4891 Unspecified atrial fibrillation: Secondary | ICD-10-CM | POA: Diagnosis not present

## 2022-11-15 DIAGNOSIS — I10 Essential (primary) hypertension: Secondary | ICD-10-CM | POA: Diagnosis not present

## 2022-11-16 DIAGNOSIS — G20C Parkinsonism, unspecified: Secondary | ICD-10-CM | POA: Diagnosis not present

## 2022-12-20 DEATH — deceased
